# Patient Record
Sex: Female | Born: 1935
Health system: Southern US, Community
[De-identification: ages and names within clinical notes are randomized; demographics above are authoritative.]

## PROBLEM LIST (undated history)

## (undated) DIAGNOSIS — I771 Stricture of artery: Secondary | ICD-10-CM

## (undated) DIAGNOSIS — I6523 Occlusion and stenosis of bilateral carotid arteries: Secondary | ICD-10-CM

## (undated) DIAGNOSIS — M810 Age-related osteoporosis without current pathological fracture: Secondary | ICD-10-CM

## (undated) DIAGNOSIS — I63239 Cerebral infarction due to unspecified occlusion or stenosis of unspecified carotid arteries: Secondary | ICD-10-CM

## (undated) DIAGNOSIS — E039 Hypothyroidism, unspecified: Secondary | ICD-10-CM

## (undated) DIAGNOSIS — I471 Supraventricular tachycardia, unspecified: Secondary | ICD-10-CM

## (undated) DIAGNOSIS — R609 Edema, unspecified: Secondary | ICD-10-CM

## (undated) DIAGNOSIS — I739 Peripheral vascular disease, unspecified: Secondary | ICD-10-CM

## (undated) DIAGNOSIS — I4891 Unspecified atrial fibrillation: Secondary | ICD-10-CM

## (undated) DIAGNOSIS — F32A Depression, unspecified: Secondary | ICD-10-CM

## (undated) DIAGNOSIS — Z9289 Personal history of other medical treatment: Secondary | ICD-10-CM

## (undated) DIAGNOSIS — I10 Essential (primary) hypertension: Secondary | ICD-10-CM

## (undated) DIAGNOSIS — F329 Major depressive disorder, single episode, unspecified: Secondary | ICD-10-CM

## (undated) DIAGNOSIS — E785 Hyperlipidemia, unspecified: Secondary | ICD-10-CM

## (undated) DIAGNOSIS — J42 Unspecified chronic bronchitis: Secondary | ICD-10-CM

## (undated) DIAGNOSIS — C4490 Unspecified malignant neoplasm of skin, unspecified: Secondary | ICD-10-CM

## (undated) DIAGNOSIS — K635 Polyp of colon: Secondary | ICD-10-CM

## (undated) HISTORY — DX: Cerebral infarction due to unspecified occlusion or stenosis of unspecified carotid artery: I63.239

## (undated) HISTORY — DX: Supraventricular tachycardia, unspecified: I47.10

## (undated) HISTORY — DX: Major depressive disorder, single episode, unspecified: F32.9

## (undated) HISTORY — PX: CATARACT EXTRACTION W/ INTRAOCULAR LENS  IMPLANT, BILATERAL: SHX1307

## (undated) HISTORY — PX: ABDOMINAL HYSTERECTOMY: SHX81

## (undated) HISTORY — PX: COLONOSCOPY W/ BIOPSIES AND POLYPECTOMY: SHX1376

## (undated) HISTORY — DX: Occlusion and stenosis of bilateral carotid arteries: I65.23

## (undated) HISTORY — PX: FRACTURE SURGERY: SHX138

## (undated) HISTORY — DX: Essential (primary) hypertension: I10

## (undated) HISTORY — DX: Unspecified atrial fibrillation: I48.91

## (undated) HISTORY — DX: Depression, unspecified: F32.A

## (undated) HISTORY — DX: Age-related osteoporosis without current pathological fracture: M81.0

## (undated) HISTORY — DX: Edema, unspecified: R60.9

## (undated) HISTORY — PX: APPENDECTOMY: SHX54

## (undated) HISTORY — DX: Peripheral vascular disease, unspecified: I73.9

## (undated) HISTORY — DX: Stricture of artery: I77.1

## (undated) HISTORY — DX: Polyp of colon: K63.5

## (undated) HISTORY — DX: Supraventricular tachycardia: I47.1

## (undated) HISTORY — DX: Hyperlipidemia, unspecified: E78.5

---

## 2006-09-02 HISTORY — PX: HIP FRACTURE SURGERY: SHX118

## 2014-08-05 DIAGNOSIS — I471 Supraventricular tachycardia: Secondary | ICD-10-CM | POA: Diagnosis not present

## 2014-08-05 DIAGNOSIS — I10 Essential (primary) hypertension: Secondary | ICD-10-CM | POA: Diagnosis not present

## 2014-08-05 DIAGNOSIS — Z1389 Encounter for screening for other disorder: Secondary | ICD-10-CM | POA: Diagnosis not present

## 2014-08-05 DIAGNOSIS — Z23 Encounter for immunization: Secondary | ICD-10-CM | POA: Diagnosis not present

## 2014-08-05 DIAGNOSIS — E785 Hyperlipidemia, unspecified: Secondary | ICD-10-CM | POA: Diagnosis not present

## 2014-08-05 DIAGNOSIS — Z9181 History of falling: Secondary | ICD-10-CM | POA: Diagnosis not present

## 2014-08-05 DIAGNOSIS — E119 Type 2 diabetes mellitus without complications: Secondary | ICD-10-CM | POA: Diagnosis not present

## 2014-08-14 DIAGNOSIS — J019 Acute sinusitis, unspecified: Secondary | ICD-10-CM | POA: Diagnosis not present

## 2014-08-14 DIAGNOSIS — J209 Acute bronchitis, unspecified: Secondary | ICD-10-CM | POA: Diagnosis not present

## 2014-08-14 DIAGNOSIS — Z6827 Body mass index (BMI) 27.0-27.9, adult: Secondary | ICD-10-CM | POA: Diagnosis not present

## 2014-09-06 DIAGNOSIS — S72043D Displaced fracture of base of neck of unspecified femur, subsequent encounter for closed fracture with routine healing: Secondary | ICD-10-CM | POA: Diagnosis not present

## 2014-10-01 DIAGNOSIS — H2511 Age-related nuclear cataract, right eye: Secondary | ICD-10-CM | POA: Diagnosis not present

## 2014-10-22 DIAGNOSIS — Z79899 Other long term (current) drug therapy: Secondary | ICD-10-CM | POA: Diagnosis not present

## 2014-10-22 DIAGNOSIS — H2511 Age-related nuclear cataract, right eye: Secondary | ICD-10-CM | POA: Diagnosis not present

## 2014-10-22 DIAGNOSIS — I10 Essential (primary) hypertension: Secondary | ICD-10-CM | POA: Diagnosis not present

## 2014-10-22 DIAGNOSIS — H259 Unspecified age-related cataract: Secondary | ICD-10-CM | POA: Diagnosis not present

## 2014-11-26 DIAGNOSIS — I10 Essential (primary) hypertension: Secondary | ICD-10-CM | POA: Diagnosis not present

## 2014-11-26 DIAGNOSIS — Z79899 Other long term (current) drug therapy: Secondary | ICD-10-CM | POA: Diagnosis not present

## 2014-11-26 DIAGNOSIS — H2512 Age-related nuclear cataract, left eye: Secondary | ICD-10-CM | POA: Diagnosis not present

## 2014-11-26 DIAGNOSIS — H259 Unspecified age-related cataract: Secondary | ICD-10-CM | POA: Diagnosis not present

## 2014-11-26 DIAGNOSIS — Z87891 Personal history of nicotine dependence: Secondary | ICD-10-CM | POA: Diagnosis not present

## 2015-01-09 DIAGNOSIS — L570 Actinic keratosis: Secondary | ICD-10-CM | POA: Diagnosis not present

## 2015-01-09 DIAGNOSIS — C44729 Squamous cell carcinoma of skin of left lower limb, including hip: Secondary | ICD-10-CM | POA: Diagnosis not present

## 2015-01-09 DIAGNOSIS — L578 Other skin changes due to chronic exposure to nonionizing radiation: Secondary | ICD-10-CM | POA: Diagnosis not present

## 2015-01-09 DIAGNOSIS — L821 Other seborrheic keratosis: Secondary | ICD-10-CM | POA: Diagnosis not present

## 2015-02-25 DIAGNOSIS — Z23 Encounter for immunization: Secondary | ICD-10-CM | POA: Diagnosis not present

## 2015-02-25 DIAGNOSIS — E785 Hyperlipidemia, unspecified: Secondary | ICD-10-CM | POA: Diagnosis not present

## 2015-02-25 DIAGNOSIS — I10 Essential (primary) hypertension: Secondary | ICD-10-CM | POA: Diagnosis not present

## 2015-02-25 DIAGNOSIS — E119 Type 2 diabetes mellitus without complications: Secondary | ICD-10-CM | POA: Diagnosis not present

## 2015-02-25 DIAGNOSIS — I471 Supraventricular tachycardia: Secondary | ICD-10-CM | POA: Diagnosis not present

## 2015-02-27 DIAGNOSIS — Z1231 Encounter for screening mammogram for malignant neoplasm of breast: Secondary | ICD-10-CM | POA: Diagnosis not present

## 2015-02-27 DIAGNOSIS — E119 Type 2 diabetes mellitus without complications: Secondary | ICD-10-CM | POA: Diagnosis not present

## 2015-02-27 DIAGNOSIS — E785 Hyperlipidemia, unspecified: Secondary | ICD-10-CM | POA: Diagnosis not present

## 2015-04-14 DIAGNOSIS — L570 Actinic keratosis: Secondary | ICD-10-CM | POA: Diagnosis not present

## 2015-04-14 DIAGNOSIS — C44729 Squamous cell carcinoma of skin of left lower limb, including hip: Secondary | ICD-10-CM | POA: Diagnosis not present

## 2015-07-08 DIAGNOSIS — H26493 Other secondary cataract, bilateral: Secondary | ICD-10-CM | POA: Diagnosis not present

## 2015-07-18 DIAGNOSIS — N390 Urinary tract infection, site not specified: Secondary | ICD-10-CM | POA: Diagnosis not present

## 2015-08-01 DIAGNOSIS — Z6827 Body mass index (BMI) 27.0-27.9, adult: Secondary | ICD-10-CM | POA: Diagnosis not present

## 2015-08-01 DIAGNOSIS — J208 Acute bronchitis due to other specified organisms: Secondary | ICD-10-CM | POA: Diagnosis not present

## 2015-08-28 DIAGNOSIS — I471 Supraventricular tachycardia: Secondary | ICD-10-CM | POA: Diagnosis not present

## 2015-08-28 DIAGNOSIS — Z9181 History of falling: Secondary | ICD-10-CM | POA: Diagnosis not present

## 2015-08-28 DIAGNOSIS — I10 Essential (primary) hypertension: Secondary | ICD-10-CM | POA: Diagnosis not present

## 2015-08-28 DIAGNOSIS — E785 Hyperlipidemia, unspecified: Secondary | ICD-10-CM | POA: Diagnosis not present

## 2015-08-28 DIAGNOSIS — Z1389 Encounter for screening for other disorder: Secondary | ICD-10-CM | POA: Diagnosis not present

## 2015-08-28 DIAGNOSIS — E119 Type 2 diabetes mellitus without complications: Secondary | ICD-10-CM | POA: Diagnosis not present

## 2015-08-28 DIAGNOSIS — Z23 Encounter for immunization: Secondary | ICD-10-CM | POA: Diagnosis not present

## 2015-09-19 DIAGNOSIS — E785 Hyperlipidemia, unspecified: Secondary | ICD-10-CM | POA: Diagnosis not present

## 2015-09-19 DIAGNOSIS — I739 Peripheral vascular disease, unspecified: Secondary | ICD-10-CM | POA: Diagnosis not present

## 2015-09-19 DIAGNOSIS — I6523 Occlusion and stenosis of bilateral carotid arteries: Secondary | ICD-10-CM | POA: Diagnosis not present

## 2015-10-13 DIAGNOSIS — C44729 Squamous cell carcinoma of skin of left lower limb, including hip: Secondary | ICD-10-CM | POA: Diagnosis not present

## 2015-10-13 DIAGNOSIS — L570 Actinic keratosis: Secondary | ICD-10-CM | POA: Diagnosis not present

## 2015-10-13 DIAGNOSIS — L821 Other seborrheic keratosis: Secondary | ICD-10-CM | POA: Diagnosis not present

## 2015-10-29 DIAGNOSIS — I6523 Occlusion and stenosis of bilateral carotid arteries: Secondary | ICD-10-CM | POA: Diagnosis not present

## 2015-10-29 DIAGNOSIS — I739 Peripheral vascular disease, unspecified: Secondary | ICD-10-CM | POA: Diagnosis not present

## 2016-02-27 DIAGNOSIS — I471 Supraventricular tachycardia: Secondary | ICD-10-CM | POA: Diagnosis not present

## 2016-02-27 DIAGNOSIS — I10 Essential (primary) hypertension: Secondary | ICD-10-CM | POA: Diagnosis not present

## 2016-02-27 DIAGNOSIS — E119 Type 2 diabetes mellitus without complications: Secondary | ICD-10-CM | POA: Diagnosis not present

## 2016-02-27 DIAGNOSIS — K219 Gastro-esophageal reflux disease without esophagitis: Secondary | ICD-10-CM | POA: Diagnosis not present

## 2016-02-27 DIAGNOSIS — E785 Hyperlipidemia, unspecified: Secondary | ICD-10-CM | POA: Diagnosis not present

## 2016-03-05 DIAGNOSIS — M8588 Other specified disorders of bone density and structure, other site: Secondary | ICD-10-CM | POA: Diagnosis not present

## 2016-03-05 DIAGNOSIS — Z1231 Encounter for screening mammogram for malignant neoplasm of breast: Secondary | ICD-10-CM | POA: Diagnosis not present

## 2016-03-05 DIAGNOSIS — M8589 Other specified disorders of bone density and structure, multiple sites: Secondary | ICD-10-CM | POA: Diagnosis not present

## 2016-03-10 DIAGNOSIS — B373 Candidiasis of vulva and vagina: Secondary | ICD-10-CM | POA: Diagnosis not present

## 2016-03-10 DIAGNOSIS — Z6827 Body mass index (BMI) 27.0-27.9, adult: Secondary | ICD-10-CM | POA: Diagnosis not present

## 2016-04-14 DIAGNOSIS — L570 Actinic keratosis: Secondary | ICD-10-CM | POA: Diagnosis not present

## 2016-04-14 DIAGNOSIS — L820 Inflamed seborrheic keratosis: Secondary | ICD-10-CM | POA: Diagnosis not present

## 2016-04-14 DIAGNOSIS — L821 Other seborrheic keratosis: Secondary | ICD-10-CM | POA: Diagnosis not present

## 2016-04-14 DIAGNOSIS — L578 Other skin changes due to chronic exposure to nonionizing radiation: Secondary | ICD-10-CM | POA: Diagnosis not present

## 2016-04-19 DIAGNOSIS — N390 Urinary tract infection, site not specified: Secondary | ICD-10-CM | POA: Diagnosis not present

## 2016-06-01 DIAGNOSIS — Z23 Encounter for immunization: Secondary | ICD-10-CM | POA: Diagnosis not present

## 2016-07-06 DIAGNOSIS — J208 Acute bronchitis due to other specified organisms: Secondary | ICD-10-CM | POA: Diagnosis not present

## 2016-07-12 DIAGNOSIS — H26493 Other secondary cataract, bilateral: Secondary | ICD-10-CM | POA: Diagnosis not present

## 2016-08-30 DIAGNOSIS — I700 Atherosclerosis of aorta: Secondary | ICD-10-CM | POA: Diagnosis not present

## 2016-08-30 DIAGNOSIS — Z1389 Encounter for screening for other disorder: Secondary | ICD-10-CM | POA: Diagnosis not present

## 2016-08-30 DIAGNOSIS — Z9181 History of falling: Secondary | ICD-10-CM | POA: Diagnosis not present

## 2016-08-30 DIAGNOSIS — R0609 Other forms of dyspnea: Secondary | ICD-10-CM | POA: Diagnosis not present

## 2016-08-30 DIAGNOSIS — E119 Type 2 diabetes mellitus without complications: Secondary | ICD-10-CM | POA: Diagnosis not present

## 2016-08-30 DIAGNOSIS — I10 Essential (primary) hypertension: Secondary | ICD-10-CM | POA: Diagnosis not present

## 2016-08-30 DIAGNOSIS — I471 Supraventricular tachycardia: Secondary | ICD-10-CM | POA: Diagnosis not present

## 2016-08-30 DIAGNOSIS — R0602 Shortness of breath: Secondary | ICD-10-CM | POA: Diagnosis not present

## 2016-08-30 DIAGNOSIS — E785 Hyperlipidemia, unspecified: Secondary | ICD-10-CM | POA: Diagnosis not present

## 2016-10-13 DIAGNOSIS — D1801 Hemangioma of skin and subcutaneous tissue: Secondary | ICD-10-CM | POA: Diagnosis not present

## 2016-10-13 DIAGNOSIS — L578 Other skin changes due to chronic exposure to nonionizing radiation: Secondary | ICD-10-CM | POA: Diagnosis not present

## 2016-10-13 DIAGNOSIS — L821 Other seborrheic keratosis: Secondary | ICD-10-CM | POA: Diagnosis not present

## 2016-10-13 DIAGNOSIS — L570 Actinic keratosis: Secondary | ICD-10-CM | POA: Diagnosis not present

## 2016-11-10 DIAGNOSIS — S29019A Strain of muscle and tendon of unspecified wall of thorax, initial encounter: Secondary | ICD-10-CM | POA: Diagnosis not present

## 2016-12-23 DIAGNOSIS — R399 Unspecified symptoms and signs involving the genitourinary system: Secondary | ICD-10-CM | POA: Diagnosis not present

## 2016-12-23 DIAGNOSIS — M546 Pain in thoracic spine: Secondary | ICD-10-CM | POA: Diagnosis not present

## 2016-12-23 DIAGNOSIS — M545 Low back pain: Secondary | ICD-10-CM | POA: Diagnosis not present

## 2016-12-23 DIAGNOSIS — M4854XA Collapsed vertebra, not elsewhere classified, thoracic region, initial encounter for fracture: Secondary | ICD-10-CM | POA: Diagnosis not present

## 2016-12-24 DIAGNOSIS — S22080A Wedge compression fracture of T11-T12 vertebra, initial encounter for closed fracture: Secondary | ICD-10-CM | POA: Diagnosis not present

## 2016-12-31 DIAGNOSIS — S22080A Wedge compression fracture of T11-T12 vertebra, initial encounter for closed fracture: Secondary | ICD-10-CM | POA: Diagnosis not present

## 2016-12-31 DIAGNOSIS — S22070A Wedge compression fracture of T9-T10 vertebra, initial encounter for closed fracture: Secondary | ICD-10-CM | POA: Diagnosis not present

## 2017-01-04 DIAGNOSIS — S22080A Wedge compression fracture of T11-T12 vertebra, initial encounter for closed fracture: Secondary | ICD-10-CM | POA: Diagnosis not present

## 2017-01-17 DIAGNOSIS — E7800 Pure hypercholesterolemia, unspecified: Secondary | ICD-10-CM | POA: Diagnosis not present

## 2017-01-17 DIAGNOSIS — J45909 Unspecified asthma, uncomplicated: Secondary | ICD-10-CM | POA: Diagnosis not present

## 2017-01-17 DIAGNOSIS — S22070A Wedge compression fracture of T9-T10 vertebra, initial encounter for closed fracture: Secondary | ICD-10-CM | POA: Diagnosis not present

## 2017-01-17 DIAGNOSIS — Z87891 Personal history of nicotine dependence: Secondary | ICD-10-CM | POA: Diagnosis not present

## 2017-01-17 DIAGNOSIS — S22060A Wedge compression fracture of T7-T8 vertebra, initial encounter for closed fracture: Secondary | ICD-10-CM | POA: Diagnosis not present

## 2017-01-17 DIAGNOSIS — K219 Gastro-esophageal reflux disease without esophagitis: Secondary | ICD-10-CM | POA: Diagnosis not present

## 2017-01-17 DIAGNOSIS — I10 Essential (primary) hypertension: Secondary | ICD-10-CM | POA: Diagnosis not present

## 2017-01-17 DIAGNOSIS — Z79899 Other long term (current) drug therapy: Secondary | ICD-10-CM | POA: Diagnosis not present

## 2017-03-02 DIAGNOSIS — I471 Supraventricular tachycardia: Secondary | ICD-10-CM | POA: Diagnosis not present

## 2017-03-02 DIAGNOSIS — I10 Essential (primary) hypertension: Secondary | ICD-10-CM | POA: Diagnosis not present

## 2017-03-02 DIAGNOSIS — E119 Type 2 diabetes mellitus without complications: Secondary | ICD-10-CM | POA: Diagnosis not present

## 2017-03-02 DIAGNOSIS — E785 Hyperlipidemia, unspecified: Secondary | ICD-10-CM | POA: Diagnosis not present

## 2017-03-02 DIAGNOSIS — Z139 Encounter for screening, unspecified: Secondary | ICD-10-CM | POA: Diagnosis not present

## 2017-03-07 DIAGNOSIS — J309 Allergic rhinitis, unspecified: Secondary | ICD-10-CM | POA: Diagnosis not present

## 2017-03-10 DIAGNOSIS — Z1231 Encounter for screening mammogram for malignant neoplasm of breast: Secondary | ICD-10-CM | POA: Diagnosis not present

## 2017-03-15 DIAGNOSIS — R300 Dysuria: Secondary | ICD-10-CM | POA: Diagnosis not present

## 2017-03-15 DIAGNOSIS — B379 Candidiasis, unspecified: Secondary | ICD-10-CM | POA: Diagnosis not present

## 2017-03-31 DIAGNOSIS — L300 Nummular dermatitis: Secondary | ICD-10-CM | POA: Diagnosis not present

## 2017-03-31 DIAGNOSIS — L299 Pruritus, unspecified: Secondary | ICD-10-CM | POA: Diagnosis not present

## 2017-06-13 DIAGNOSIS — J320 Chronic maxillary sinusitis: Secondary | ICD-10-CM | POA: Diagnosis not present

## 2017-06-13 DIAGNOSIS — R300 Dysuria: Secondary | ICD-10-CM | POA: Diagnosis not present

## 2017-06-13 DIAGNOSIS — H6692 Otitis media, unspecified, left ear: Secondary | ICD-10-CM | POA: Diagnosis not present

## 2017-06-14 DIAGNOSIS — R300 Dysuria: Secondary | ICD-10-CM | POA: Diagnosis not present

## 2017-07-01 DIAGNOSIS — J208 Acute bronchitis due to other specified organisms: Secondary | ICD-10-CM | POA: Diagnosis not present

## 2017-07-28 DIAGNOSIS — H26493 Other secondary cataract, bilateral: Secondary | ICD-10-CM | POA: Diagnosis not present

## 2017-08-15 DIAGNOSIS — Z23 Encounter for immunization: Secondary | ICD-10-CM | POA: Diagnosis not present

## 2017-08-20 DIAGNOSIS — J209 Acute bronchitis, unspecified: Secondary | ICD-10-CM | POA: Diagnosis not present

## 2017-08-20 DIAGNOSIS — J0100 Acute maxillary sinusitis, unspecified: Secondary | ICD-10-CM | POA: Diagnosis not present

## 2017-09-04 DIAGNOSIS — R112 Nausea with vomiting, unspecified: Secondary | ICD-10-CM | POA: Diagnosis not present

## 2017-09-04 DIAGNOSIS — K219 Gastro-esophageal reflux disease without esophagitis: Secondary | ICD-10-CM | POA: Diagnosis not present

## 2017-09-04 DIAGNOSIS — R609 Edema, unspecified: Secondary | ICD-10-CM | POA: Diagnosis not present

## 2017-09-04 DIAGNOSIS — Z79899 Other long term (current) drug therapy: Secondary | ICD-10-CM | POA: Diagnosis not present

## 2017-09-04 DIAGNOSIS — R110 Nausea: Secondary | ICD-10-CM | POA: Diagnosis not present

## 2017-09-04 DIAGNOSIS — R251 Tremor, unspecified: Secondary | ICD-10-CM | POA: Diagnosis not present

## 2017-09-04 DIAGNOSIS — I447 Left bundle-branch block, unspecified: Secondary | ICD-10-CM | POA: Diagnosis not present

## 2017-09-04 DIAGNOSIS — R29700 NIHSS score 0: Secondary | ICD-10-CM | POA: Diagnosis not present

## 2017-09-04 DIAGNOSIS — R069 Unspecified abnormalities of breathing: Secondary | ICD-10-CM | POA: Diagnosis not present

## 2017-09-04 DIAGNOSIS — I4891 Unspecified atrial fibrillation: Secondary | ICD-10-CM | POA: Diagnosis not present

## 2017-09-04 DIAGNOSIS — Z87891 Personal history of nicotine dependence: Secondary | ICD-10-CM | POA: Diagnosis not present

## 2017-09-04 DIAGNOSIS — M810 Age-related osteoporosis without current pathological fracture: Secondary | ICD-10-CM | POA: Diagnosis not present

## 2017-09-04 DIAGNOSIS — R9431 Abnormal electrocardiogram [ECG] [EKG]: Secondary | ICD-10-CM | POA: Diagnosis not present

## 2017-09-04 DIAGNOSIS — I10 Essential (primary) hypertension: Secondary | ICD-10-CM | POA: Diagnosis not present

## 2017-09-04 DIAGNOSIS — R0600 Dyspnea, unspecified: Secondary | ICD-10-CM | POA: Diagnosis not present

## 2017-09-04 DIAGNOSIS — Z7982 Long term (current) use of aspirin: Secondary | ICD-10-CM | POA: Diagnosis not present

## 2017-09-05 DIAGNOSIS — R079 Chest pain, unspecified: Secondary | ICD-10-CM | POA: Diagnosis not present

## 2017-09-05 DIAGNOSIS — M810 Age-related osteoporosis without current pathological fracture: Secondary | ICD-10-CM | POA: Diagnosis not present

## 2017-09-05 DIAGNOSIS — I4891 Unspecified atrial fibrillation: Secondary | ICD-10-CM | POA: Diagnosis not present

## 2017-09-05 DIAGNOSIS — I10 Essential (primary) hypertension: Secondary | ICD-10-CM | POA: Diagnosis not present

## 2017-09-06 ENCOUNTER — Encounter: Payer: Self-pay | Admitting: *Deleted

## 2017-09-06 DIAGNOSIS — I771 Stricture of artery: Secondary | ICD-10-CM

## 2017-09-06 DIAGNOSIS — F329 Major depressive disorder, single episode, unspecified: Secondary | ICD-10-CM | POA: Insufficient documentation

## 2017-09-06 DIAGNOSIS — E119 Type 2 diabetes mellitus without complications: Secondary | ICD-10-CM | POA: Diagnosis not present

## 2017-09-06 DIAGNOSIS — K635 Polyp of colon: Secondary | ICD-10-CM | POA: Insufficient documentation

## 2017-09-06 DIAGNOSIS — R609 Edema, unspecified: Secondary | ICD-10-CM | POA: Insufficient documentation

## 2017-09-06 DIAGNOSIS — I471 Supraventricular tachycardia, unspecified: Secondary | ICD-10-CM | POA: Insufficient documentation

## 2017-09-06 DIAGNOSIS — I4891 Unspecified atrial fibrillation: Secondary | ICD-10-CM | POA: Diagnosis not present

## 2017-09-06 DIAGNOSIS — E785 Hyperlipidemia, unspecified: Secondary | ICD-10-CM | POA: Insufficient documentation

## 2017-09-06 DIAGNOSIS — I10 Essential (primary) hypertension: Secondary | ICD-10-CM | POA: Diagnosis not present

## 2017-09-06 DIAGNOSIS — I739 Peripheral vascular disease, unspecified: Secondary | ICD-10-CM | POA: Insufficient documentation

## 2017-09-06 DIAGNOSIS — F32A Depression, unspecified: Secondary | ICD-10-CM | POA: Insufficient documentation

## 2017-09-06 DIAGNOSIS — I6523 Occlusion and stenosis of bilateral carotid arteries: Secondary | ICD-10-CM

## 2017-09-06 DIAGNOSIS — S72001A Fracture of unspecified part of neck of right femur, initial encounter for closed fracture: Secondary | ICD-10-CM | POA: Insufficient documentation

## 2017-09-06 DIAGNOSIS — Z79899 Other long term (current) drug therapy: Secondary | ICD-10-CM | POA: Diagnosis not present

## 2017-09-06 DIAGNOSIS — I63239 Cerebral infarction due to unspecified occlusion or stenosis of unspecified carotid arteries: Secondary | ICD-10-CM

## 2017-09-16 DIAGNOSIS — N390 Urinary tract infection, site not specified: Secondary | ICD-10-CM | POA: Diagnosis not present

## 2017-09-19 ENCOUNTER — Ambulatory Visit: Payer: Medicare Other | Admitting: Cardiology

## 2017-09-19 ENCOUNTER — Encounter: Payer: Self-pay | Admitting: Cardiology

## 2017-09-19 VITALS — BP 190/120 | HR 101 | Ht 65.0 in | Wt 153.0 lb

## 2017-09-19 DIAGNOSIS — I6523 Occlusion and stenosis of bilateral carotid arteries: Secondary | ICD-10-CM

## 2017-09-19 DIAGNOSIS — I10 Essential (primary) hypertension: Secondary | ICD-10-CM | POA: Diagnosis not present

## 2017-09-19 DIAGNOSIS — I480 Paroxysmal atrial fibrillation: Secondary | ICD-10-CM | POA: Diagnosis not present

## 2017-09-19 DIAGNOSIS — I4819 Other persistent atrial fibrillation: Secondary | ICD-10-CM | POA: Insufficient documentation

## 2017-09-19 DIAGNOSIS — I771 Stricture of artery: Secondary | ICD-10-CM | POA: Diagnosis not present

## 2017-09-19 DIAGNOSIS — E785 Hyperlipidemia, unspecified: Secondary | ICD-10-CM

## 2017-09-19 MED ORDER — METOPROLOL TARTRATE 25 MG PO TABS: 25.0000 mg | ORAL_TABLET | Freq: Two times a day (BID) | ORAL | 3 refills | Status: DC

## 2017-09-19 NOTE — Patient Instructions (Signed)
Medication Instructions:  Your physician has recommended you make the following change in your medication:  Increase your Metoprolol 25 mg 1 tablet twice daily  Labwork: Your physician recommends that you have lab work today: BMP  Testing/Procedures: Your physician has requested that you have a carotid duplex. This test is an ultrasound of the carotid arteries in your neck. It looks at blood flow through these arteries that supply the brain with blood. Allow one hour for this exam. There are no restrictions or special instructions.  Follow-Up: Your physician recommends that you schedule a follow-up appointment in: 2 weeks with DrMarland Kitchen Agustin Cree   Any Other Special Instructions Will Be Listed Below (If Applicable).     If you need a refill on your cardiac medications before your next appointment, please call your pharmacy.

## 2017-09-19 NOTE — Progress Notes (Signed)
**Note Brooke-Identified via Obfuscation** Cardiology Consultation:    Date:  09/19/2017   ID:  Brooke Mullins, DOB [birth year], MRN [id number]  PCP:  [name], MD  Cardiologist:  [name], MD   Referring MD: No ref. provider found   Chief Complaint  Patient presents with  . Hospitalization Follow-up  I had atrial fibrillation  History of Present Illness:    Brooke Mullins is a 84 y.o. female who is being seen today for the evaluation of atrial fibrillation at the request of No ref. provider found.  2 weeks ago she was hospitalized in [facility].  The reason for hospitalization was the fact that she started shaking and she was find to be in atrial fibrillation.  Quite extensive evaluation was done including CT of her head which did not show any acute changes: She had a stress test which was negative.  She also had echocardiogram which showed ejection fraction of 5055%.  She was diagnosed with new onset of atrial fibrillation.  She was anticoagulated and discharged home.  Today took she comes to our office to be established as a patient.  She feels horrible.  She said her heart speeds up very easily when she does even minimal effort.  Described to have some exertional shortness of breath but no chest pain tightness squeezing pressure burning chest.  She is aware of her heart speeding up.  Past Medical History:  Diagnosis Date  . Atrial fibrillation ([place])   . Carotid artery stenosis with cerebral infarction ([place])   . Carotid atherosclerosis, bilateral   . Depression   . Diabetes mellitus ([place])   . Dyslipidemia   . Edema   . Fracture of femoral neck, right ([place])   . Hyperplastic colon polyp   . Hypertension   . Osteoporosis   . Peripheral vascular disease of extremity ([place])   . PSVT (paroxysmal supraventricular tachycardia) ([place])   . Subclavian artery stenosis [facility])     Past Surgical History:  Procedure Laterality Date  . ABDOMINAL HYSTERECTOMY      Current Medications: Current Meds  Medication  Sig  . albuterol (PROAIR HFA) 108 (90 Base) MCG/ACT inhaler Inhale into the lungs.  . ciprofloxacin (CIPRO) 250 MG tablet Take 250 mg by mouth 2 (two) times daily with a meal.  . citalopram (CELEXA) 20 MG tablet Take 20 mg by mouth.  Arne Cleveland 5 MG TABS tablet Take 5 mg by mouth 2 (two) times daily.  . furosemide (LASIX) 20 MG tablet Take 20 mg by mouth.  . metoprolol tartrate (LOPRESSOR) 25 MG tablet Take 12.5 mg by mouth 2 (two) times daily.  Marland Kitchen omeprazole (PRILOSEC) 20 MG capsule Take 20 mg by mouth daily.  . quinapril (ACCUPRIL) 40 MG tablet TAKE ONE TABLET EVERY DAY AT BEDTIME     Allergies:   Penicillins; Atorvastatin; Azithromycin; Codeine; Colesevelam; and Ezetimibe   Social History   Socioeconomic History  . Marital status: Unknown    Spouse name: None  . Number of children: None  . Years of education: None  . Highest education level: None  Social Needs  . Financial resource strain: None  . Food insecurity - worry: None  . Food insecurity - inability: None  . Transportation needs - medical: None  . Transportation needs - non-medical: None  Occupational History  . None  Tobacco Use  . Smoking status: Former Research scientist (life sciences)  . Smokeless tobacco: Never Used  Substance and Sexual Activity  . Alcohol use: No    Frequency: Never  .  Drug use: No  . Sexual activity: None  Other Topics Concern  . None  Social History Narrative  . None     Family History: The patient's family history includes COPD in her father; Cancer in her brother; Heart failure in her mother; Hypertension in her brother. ROS:   Please see the history of present illness.    All 14 point review of systems negative except as described per history of present illness.  EKGs/Labs/Other Studies Reviewed:    The following studies were reviewed today: All study from hospital reviewed including CT of her head: Echocardiogram: Stress test.  EKG:  EKG is  ordered today.  The ekg ordered today demonstrates atrial  fibrillation controlled ventricular rate: Left bundle branch block  Recent Labs: No results found for requested labs within last 8760 hours.  Recent Lipid Panel No results found for: CHOL, TRIG, HDL, CHOLHDL, VLDL, LDLCALC, LDLDIRECT  Physical Exam:    VS:  BP (!) 190/120 (BP Location: Right Arm, Patient Position: Sitting, Cuff Size: Normal)   Pulse (!) 101   Ht 5\' 5"  (1.651 m)   Wt 153 lb (69.4 kg)   SpO2 98%   BMI 25.46 kg/m     Wt Readings from Last 3 Encounters:  09/19/17 153 lb (69.4 kg)     GEN:  Well nourished, well developed in no acute distress HEENT: Normal NECK: No JVD; No carotid bruits LYMPHATICS: No lymphadenopathy CARDIAC: Irregularly irregular, no murmurs, no rubs, no gallops RESPIRATORY:  Clear to auscultation without rales, wheezing or rhonchi  ABDOMEN: Soft, non-tender, non-distended MUSCULOSKELETAL: 1+; No deformity  SKIN: Warm and dry NEUROLOGIC:  Alert and oriented x 3 PSYCHIATRIC:  Normal affect   ASSESSMENT:    1. Essential hypertension   2. Carotid atherosclerosis, bilateral   3. Subclavian artery stenosis (HCC)   4. Dyslipidemia    PLAN:    In order of problems listed above:  1. Persistent atrial fibrillation: She is anticoagulated with Eliquis which I will continue.  I will increase dose of beta-blocker however first I would like to check her pulse oximetry and wake her walk around to see what his heart rate does with exercise.  We were talking about options in this situation option 1 waiting for another 2 weeks for full anticoagulation in 4 weeks of anticoagulation and convert her elected to to sinus rhythm or a transesophageal echocardiogram with conversion right away.  She favored to wait for another 2 weeks.  I hope by increasing dose of metoprolol to 25 twice daily make her feel better.   CHADS2Vas = 4 2. Essential hypertension: Blood pressure elevated today but he check it at home usually 130/70.  Hopefully will be better controlled with  slightly higher dose of beta-blocker. 3. Dyslipidemia: She is taking high intensity statin which I will continue. 4. History of peripheral vascular disease with subclavian artery stenosis: I will schedule her to have carotid ultrasound. 5. She does have some swelling of lower extremities I will ask her to have Chem-7 done today if Chem-7 is acceptable we will give her slightly higher dose of diuretic.   Medication Adjustments/Labs and Tests Ordered: Current medicines are reviewed at length with the patient today.  Concerns regarding medicines are outlined above.  No orders of the defined types were placed in this encounter.  No orders of the defined types were placed in this encounter.   Signed, Park Liter, MD, [facility]. 09/19/2017 3:51 PM    [facility]

## 2017-09-20 LAB — BASIC METABOLIC PANEL
BUN/Creatinine Ratio: 12 (ref 12–28)
BUN: 12 mg/dL (ref 8–27)
CALCIUM: 9.4 mg/dL (ref 8.7–10.3)
CO2: 23 mmol/L (ref 20–29)
Chloride: 98 mmol/L (ref 96–106)
Creatinine, Ser: 0.98 mg/dL (ref 0.57–1.00)
GFR calc Af Amer: 63 mL/min/{1.73_m2} (ref >59)
GFR, EST NON AFRICAN AMERICAN: 54 mL/min/{1.73_m2} — AB (ref >59)
GLUCOSE: 105 mg/dL — AB (ref 65–99)
Potassium: 4.4 mmol/L (ref 3.5–5.2)
Sodium: 139 mmol/L (ref 134–144)

## 2017-10-05 DIAGNOSIS — Z1231 Encounter for screening mammogram for malignant neoplasm of breast: Secondary | ICD-10-CM | POA: Diagnosis not present

## 2017-10-05 DIAGNOSIS — Z9181 History of falling: Secondary | ICD-10-CM | POA: Diagnosis not present

## 2017-10-05 DIAGNOSIS — N959 Unspecified menopausal and perimenopausal disorder: Secondary | ICD-10-CM | POA: Diagnosis not present

## 2017-10-05 DIAGNOSIS — Z0000 Encounter for general adult medical examination without abnormal findings: Secondary | ICD-10-CM | POA: Diagnosis not present

## 2017-10-05 DIAGNOSIS — Z1331 Encounter for screening for depression: Secondary | ICD-10-CM | POA: Diagnosis not present

## 2017-10-05 DIAGNOSIS — Z136 Encounter for screening for cardiovascular disorders: Secondary | ICD-10-CM | POA: Diagnosis not present

## 2017-10-05 DIAGNOSIS — E785 Hyperlipidemia, unspecified: Secondary | ICD-10-CM | POA: Diagnosis not present

## 2017-10-10 ENCOUNTER — Ambulatory Visit: Payer: Medicare Other | Admitting: Cardiology

## 2017-10-13 ENCOUNTER — Ambulatory Visit (HOSPITAL_BASED_OUTPATIENT_CLINIC_OR_DEPARTMENT_OTHER)
Admission: RE | Admit: 2017-10-13 | Discharge: 2017-10-13 | Disposition: A | Discharge status: Home / Self Care | Payer: Medicare Other | Source: Ambulatory Visit | Attending: Cardiology | Admitting: Cardiology

## 2017-10-13 ENCOUNTER — Encounter: Payer: Self-pay | Admitting: *Deleted

## 2017-10-13 ENCOUNTER — Encounter: Payer: Self-pay | Admitting: Cardiology

## 2017-10-13 ENCOUNTER — Ambulatory Visit (INDEPENDENT_AMBULATORY_CARE_PROVIDER_SITE_OTHER): Payer: Medicare Other | Admitting: Cardiology

## 2017-10-13 VITALS — BP 138/88 | HR 84 | Ht 65.0 in | Wt 155.0 lb

## 2017-10-13 DIAGNOSIS — I6523 Occlusion and stenosis of bilateral carotid arteries: Secondary | ICD-10-CM

## 2017-10-13 DIAGNOSIS — I63239 Cerebral infarction due to unspecified occlusion or stenosis of unspecified carotid arteries: Secondary | ICD-10-CM | POA: Diagnosis not present

## 2017-10-13 DIAGNOSIS — I480 Paroxysmal atrial fibrillation: Secondary | ICD-10-CM | POA: Diagnosis not present

## 2017-10-13 DIAGNOSIS — I471 Supraventricular tachycardia: Secondary | ICD-10-CM

## 2017-10-13 DIAGNOSIS — E785 Hyperlipidemia, unspecified: Secondary | ICD-10-CM

## 2017-10-13 DIAGNOSIS — I10 Essential (primary) hypertension: Secondary | ICD-10-CM | POA: Diagnosis not present

## 2017-10-13 DIAGNOSIS — I771 Stricture of artery: Secondary | ICD-10-CM

## 2017-10-13 NOTE — Progress Notes (Signed)
*  PRELIMINARY RESULTS*  Carotid duplex performed, Right side ICA is occluded, No flow was detected, Left side ICA is mild to moderately stenosed, Moderate atherosclerosis bilaterally.   Joelene Millin 10/13/2017, 3:38 PM

## 2017-10-13 NOTE — H&P (View-Only) (Signed)
Cardiology Office Note:    Date:  10/13/2017   ID:  KIAHNA BANGHART, DOB 1933-10-27, MRN [id number]  PCP:  [name], MD  Cardiologist:  [name], MD    Referring MD: [name], MD   Chief Complaint  Patient presents with  . Follow-up    to discuss fatigue and SHOB  Still feeling weak and tired  History of Present Illness:    Brooke Mullins is a 84 y.o. female with persistent atrial fibrillation.  She was scheduled to have carotid ultrasounds today I will requested to be seen because still feeling poorly.  Complaint of being weak tired exhausted also some shortness of breath feels her heart sleep flapping and palpitating.  She appears to be still in atrial fibrillation I told her that her symptoms could be related to her atrial fibrillation and will expedite his cardioversion.  She is being already taking anticoagulation for at least 5 weeks.  Plan will be to do EKG today if EKG show me still atrial fibrillation which I suspect will be the case we will schedule her for elective cardioversion.  She will have her carotid ultrasound today  Past Medical History:  Diagnosis Date  . Atrial fibrillation ([place])   . Carotid artery stenosis with cerebral infarction ([place])   . Carotid atherosclerosis, bilateral   . Depression   . Diabetes mellitus ([place])   . Dyslipidemia   . Edema   . Fracture of femoral neck, right ([place])   . Hyperplastic colon polyp   . Hypertension   . Osteoporosis   . Peripheral vascular disease of extremity ([place])   . PSVT (paroxysmal supraventricular tachycardia) ([place])   . Subclavian artery stenosis [facility])     Past Surgical History:  Procedure Laterality Date  . ABDOMINAL HYSTERECTOMY      Current Medications: Current Meds  Medication Sig  . albuterol (PROAIR HFA) 108 (90 Base) MCG/ACT inhaler Inhale into the lungs.  . citalopram (CELEXA) 20 MG tablet Take 20 mg by mouth.  Arne Cleveland 5 MG TABS tablet Take 5 mg by mouth 2 (two) times daily.    . furosemide (LASIX) 20 MG tablet Take 20 mg by mouth daily as needed.   . metoprolol tartrate (LOPRESSOR) 25 MG tablet Take 1 tablet (25 mg total) by mouth 2 (two) times daily.  Marland Kitchen omeprazole (PRILOSEC) 20 MG capsule Take 20 mg by mouth daily.  . quinapril (ACCUPRIL) 40 MG tablet TAKE ONE TABLET EVERY DAY AT BEDTIME     Allergies:   Penicillins; Atorvastatin; Azithromycin; Codeine; Colesevelam; and Ezetimibe   Social History   Socioeconomic History  . Marital status: Unknown    Spouse name: None  . Number of children: None  . Years of education: None  . Highest education level: None  Social Needs  . Financial resource strain: None  . Food insecurity - worry: None  . Food insecurity - inability: None  . Transportation needs - medical: None  . Transportation needs - non-medical: None  Occupational History  . None  Tobacco Use  . Smoking status: Former Research scientist (life sciences)  . Smokeless tobacco: Never Used  Substance and Sexual Activity  . Alcohol use: No    Frequency: Never  . Drug use: No  . Sexual activity: None  Other Topics Concern  . None  Social History Narrative  . None     Family History: The patient's family history includes COPD in her father; Cancer in her brother; Heart failure in her mother;  Hypertension in her brother. ROS:   Please see the history of present illness.    All 14 point review of systems negative except as described per history of present illness  EKGs/Labs/Other Studies Reviewed:      Recent Labs: 09/19/2017: BUN 12; Creatinine, Ser 0.98; Potassium 4.4; Sodium 139  Recent Lipid Panel No results found for: CHOL, TRIG, HDL, CHOLHDL, VLDL, LDLCALC, LDLDIRECT  Physical Exam:    VS:  BP 138/88 (BP Location: Right Arm, Patient Position: Sitting, Cuff Size: Normal)   Pulse 84   Ht 5\' 5"  (1.651 m)   Wt 155 lb (70.3 kg)   SpO2 97%   BMI 25.79 kg/m     Wt Readings from Last 3 Encounters:  10/13/17 155 lb (70.3 kg)  09/19/17 153 lb (69.4 kg)      GEN:  Well nourished, well developed in no acute distress HEENT: Normal NECK: No JVD; No carotid bruits LYMPHATICS: No lymphadenopathy CARDIAC: Irregularly irregular, no murmurs, no rubs, no gallops RESPIRATORY:  Clear to auscultation without rales, wheezing or rhonchi  ABDOMEN: Soft, non-tender, non-distended MUSCULOSKELETAL:  No edema; No deformity  SKIN: Warm and dry LOWER EXTREMITIES: no swelling NEUROLOGIC:  Alert and oriented x 3 PSYCHIATRIC:  Normal affect   ASSESSMENT:    1. Carotid artery stenosis with cerebral infarction ([place])   2. Essential hypertension   3. Subclavian artery stenosis (HCC)   4. Dyslipidemia    PLAN:    In order of problems listed above:  1. Persistent atrial fibrillation: EKG will be done to confirm it continue anticoagulation will do cardioversion. 2. Essential hypertension: Blood pressure well controlled continue present management. 3. Subclavian artery stenosis we will continue monitoring.  Carotid ultrasounds will be done today. 4. His lipidemia on statin.   Medication Adjustments/Labs and Tests Ordered: Current medicines are reviewed at length with the patient today.  Concerns regarding medicines are outlined above.  No orders of the defined types were placed in this encounter.  Medication changes: No orders of the defined types were placed in this encounter.   Signed, Park Liter, MD, [facility] 10/13/2017 2:17 PM    [facility]

## 2017-10-13 NOTE — Patient Instructions (Addendum)
Medication Instructions:  Your physician recommends that you continue on your current medications as directed. Please refer to the Current Medication list given to you today.   Labwork: None  Testing/Procedures: You had an EKG today.   You are scheduled for a cardioversion on October 17, 2016 at 9 am. Please arrive at 7:30 am.   Follow-Up: Your physician recommends that you schedule a follow-up appointment in: 1 month.  Any Other Special Instructions Will Be Listed Below (If Applicable).     If you need a refill on your cardiac medications before your next appointment, please call your pharmacy.

## 2017-10-13 NOTE — Addendum Note (Signed)
Addended by: [name] on: 10/13/2017 03:16 PM   Modules accepted: Orders

## 2017-10-13 NOTE — Addendum Note (Signed)
Addended by: Austin Miles on: 10/13/2017 02:51 PM   Modules accepted: Orders

## 2017-10-13 NOTE — Progress Notes (Signed)
Cardiology Office Note:    Date:  10/13/2017   ID:  Brooke Mullins, DOB Jun 13, 1934, MRN [id number]  PCP:  Brooke Dress, MD  Cardiologist:  Brooke Campus, MD    Referring MD: Brooke Dress, MD   Chief Complaint  Patient presents with  . Follow-up    to discuss fatigue and SHOB  Still feeling weak and tired  History of Present Illness:    Brooke Mullins is a 84 y.o. female with persistent atrial fibrillation.  She was scheduled to have carotid ultrasounds today I will requested to be seen because still feeling poorly.  Complaint of being weak tired exhausted also some shortness of breath feels her heart sleep flapping and palpitating.  She appears to be still in atrial fibrillation I told her that her symptoms could be related to her atrial fibrillation and will expedite his cardioversion.  She is being already taking anticoagulation for at least 5 weeks.  Plan will be to do EKG today if EKG show me still atrial fibrillation which I suspect will be the case we will schedule her for elective cardioversion.  She will have her carotid ultrasound today  Past Medical History:  Diagnosis Date  . Atrial fibrillation ([place])   . Carotid artery stenosis with cerebral infarction ([place])   . Carotid atherosclerosis, bilateral   . Depression   . Diabetes mellitus ([place])   . Dyslipidemia   . Edema   . Fracture of femoral neck, right ([place])   . Hyperplastic colon polyp   . Hypertension   . Osteoporosis   . Peripheral vascular disease of extremity ([place])   . PSVT (paroxysmal supraventricular tachycardia) ([place])   . Subclavian artery stenosis [facility])     Past Surgical History:  Procedure Laterality Date  . ABDOMINAL HYSTERECTOMY      Current Medications: Current Meds  Medication Sig  . albuterol (PROAIR HFA) 108 (90 Base) MCG/ACT inhaler Inhale into the lungs.  . citalopram (CELEXA) 20 MG tablet Take 20 mg by mouth.  Arne Cleveland 5 MG TABS tablet Take 5 mg by mouth 2 (two) times daily.    . furosemide (LASIX) 20 MG tablet Take 20 mg by mouth daily as needed.   . metoprolol tartrate (LOPRESSOR) 25 MG tablet Take 1 tablet (25 mg total) by mouth 2 (two) times daily.  Marland Kitchen omeprazole (PRILOSEC) 20 MG capsule Take 20 mg by mouth daily.  . quinapril (ACCUPRIL) 40 MG tablet TAKE ONE TABLET EVERY DAY AT BEDTIME     Allergies:   Penicillins; Atorvastatin; Azithromycin; Codeine; Colesevelam; and Ezetimibe   Social History   Socioeconomic History  . Marital status: Unknown    Spouse name: None  . Number of children: None  . Years of education: None  . Highest education level: None  Social Needs  . Financial resource strain: None  . Food insecurity - worry: None  . Food insecurity - inability: None  . Transportation needs - medical: None  . Transportation needs - non-medical: None  Occupational History  . None  Tobacco Use  . Smoking status: Former Research scientist (life sciences)  . Smokeless tobacco: Never Used  Substance and Sexual Activity  . Alcohol use: No    Frequency: Never  . Drug use: No  . Sexual activity: None  Other Topics Concern  . None  Social History Narrative  . None     Family History: The patient's family history includes COPD in her father; Cancer in her brother; Heart failure in her mother;  Hypertension in her brother. ROS:   Please see the history of present illness.    All 14 point review of systems negative except as described per history of present illness  EKGs/Labs/Other Studies Reviewed:      Recent Labs: 09/19/2017: BUN 12; Creatinine, Ser 0.98; Potassium 4.4; Sodium 139  Recent Lipid Panel No results found for: CHOL, TRIG, HDL, CHOLHDL, VLDL, LDLCALC, LDLDIRECT  Physical Exam:    VS:  BP 138/88 (BP Location: Right Arm, Patient Position: Sitting, Cuff Size: Normal)   Pulse 84   Ht 5\' 5"  (1.651 m)   Wt 155 lb (70.3 kg)   SpO2 97%   BMI 25.79 kg/m     Wt Readings from Last 3 Encounters:  10/13/17 155 lb (70.3 kg)  09/19/17 153 lb (69.4 kg)      GEN:  Well nourished, well developed in no acute distress HEENT: Normal NECK: No JVD; No carotid bruits LYMPHATICS: No lymphadenopathy CARDIAC: Irregularly irregular, no murmurs, no rubs, no gallops RESPIRATORY:  Clear to auscultation without rales, wheezing or rhonchi  ABDOMEN: Soft, non-tender, non-distended MUSCULOSKELETAL:  No edema; No deformity  SKIN: Warm and dry LOWER EXTREMITIES: no swelling NEUROLOGIC:  Alert and oriented x 3 PSYCHIATRIC:  Normal affect   ASSESSMENT:    1. Carotid artery stenosis with cerebral infarction ([place])   2. Essential hypertension   3. Subclavian artery stenosis (HCC)   4. Dyslipidemia    PLAN:    In order of problems listed above:  1. Persistent atrial fibrillation: EKG will be done to confirm it continue anticoagulation will do cardioversion. 2. Essential hypertension: Blood pressure well controlled continue present management. 3. Subclavian artery stenosis we will continue monitoring.  Carotid ultrasounds will be done today. 4. His lipidemia on statin.   Medication Adjustments/Labs and Tests Ordered: Current medicines are reviewed at length with the patient today.  Concerns regarding medicines are outlined above.  No orders of the defined types were placed in this encounter.  Medication changes: No orders of the defined types were placed in this encounter.   Signed, Park Liter, MD, [facility] 10/13/2017 2:17 PM    [facility]

## 2017-10-14 LAB — BASIC METABOLIC PANEL
BUN/Creatinine Ratio: 11 — ABNORMAL LOW (ref 12–28)
BUN: 8 mg/dL (ref 8–27)
CALCIUM: 9 mg/dL (ref 8.7–10.3)
CO2: 25 mmol/L (ref 20–29)
Chloride: 103 mmol/L (ref 96–106)
Creatinine, Ser: 0.76 mg/dL (ref 0.57–1.00)
GFR, EST AFRICAN AMERICAN: 85 mL/min/{1.73_m2} (ref >59)
GFR, EST NON AFRICAN AMERICAN: 74 mL/min/{1.73_m2} (ref >59)
Glucose: 118 mg/dL — ABNORMAL HIGH (ref 65–99)
POTASSIUM: 4.4 mmol/L (ref 3.5–5.2)
SODIUM: 142 mmol/L (ref 134–144)

## 2017-10-14 LAB — CBC
HEMATOCRIT: 37 % (ref 34.0–46.6)
Hemoglobin: 12.1 g/dL (ref 11.1–15.9)
MCH: 29 pg (ref 26.6–33.0)
MCHC: 32.7 g/dL (ref 31.5–35.7)
MCV: 89 fL (ref 79–97)
Platelets: 252 10*3/uL (ref 150–379)
RBC: 4.17 x10E6/uL (ref 3.77–5.28)
RDW: 14.5 % (ref 12.3–15.4)
WBC: 6.1 10*3/uL (ref 3.4–10.8)

## 2017-10-16 NOTE — Anesthesia Preprocedure Evaluation (Addendum)
Anesthesia Evaluation  Patient identified by MRN, date of birth, ID band Patient awake    Reviewed: Allergy & Precautions, NPO status , Patient's Chart, lab work & pertinent test results  Airway Mallampati: II  TM Distance: >3 FB Neck ROM: Full    Dental no notable dental hx.    Pulmonary former smoker,    Pulmonary exam normal breath sounds clear to auscultation       Cardiovascular hypertension, Normal cardiovascular exam+ dysrhythmias Atrial Fibrillation  Rhythm:Regular Rate:Normal     Neuro/Psych    GI/Hepatic   Endo/Other  diabetes  Renal/GU      Musculoskeletal   Abdominal   Peds  Hematology   Anesthesia Other Findings   Reproductive/Obstetrics                            Anesthesia Physical Anesthesia Plan  ASA: III  Anesthesia Plan: General   Post-op Pain Management:    Induction: Intravenous  PONV Risk Score and Plan:   Airway Management Planned: Mask  Additional Equipment:   Intra-op Plan:   Post-operative Plan:   Informed Consent: I have reviewed the patients History and Physical, chart, labs and discussed the procedure including the risks, benefits and alternatives for the proposed anesthesia with the patient or authorized representative who has indicated his/her understanding and acceptance.     Plan Discussed with: CRNA  Anesthesia Plan Comments:         Anesthesia Quick Evaluation

## 2017-10-17 ENCOUNTER — Encounter (HOSPITAL_COMMUNITY)
Admission: RE | Disposition: A | Discharge status: Home / Self Care | Payer: Self-pay | Source: Ambulatory Visit | Attending: Cardiovascular Disease

## 2017-10-17 ENCOUNTER — Ambulatory Visit (HOSPITAL_COMMUNITY): Payer: Medicare Other | Admitting: Anesthesiology

## 2017-10-17 ENCOUNTER — Other Ambulatory Visit: Payer: Self-pay

## 2017-10-17 ENCOUNTER — Encounter (HOSPITAL_COMMUNITY): Payer: Self-pay | Admitting: *Deleted

## 2017-10-17 ENCOUNTER — Ambulatory Visit (HOSPITAL_COMMUNITY)
Admission: RE | Admit: 2017-10-17 | Discharge: 2017-10-17 | Disposition: A | Discharge status: Home / Self Care | Payer: Medicare Other | Source: Ambulatory Visit | Attending: Cardiovascular Disease | Admitting: Cardiovascular Disease

## 2017-10-17 DIAGNOSIS — Z87891 Personal history of nicotine dependence: Secondary | ICD-10-CM | POA: Diagnosis not present

## 2017-10-17 DIAGNOSIS — F329 Major depressive disorder, single episode, unspecified: Secondary | ICD-10-CM | POA: Insufficient documentation

## 2017-10-17 DIAGNOSIS — Z8673 Personal history of transient ischemic attack (TIA), and cerebral infarction without residual deficits: Secondary | ICD-10-CM | POA: Diagnosis not present

## 2017-10-17 DIAGNOSIS — M810 Age-related osteoporosis without current pathological fracture: Secondary | ICD-10-CM | POA: Insufficient documentation

## 2017-10-17 DIAGNOSIS — E785 Hyperlipidemia, unspecified: Secondary | ICD-10-CM | POA: Diagnosis not present

## 2017-10-17 DIAGNOSIS — I481 Persistent atrial fibrillation: Secondary | ICD-10-CM | POA: Insufficient documentation

## 2017-10-17 DIAGNOSIS — Z7901 Long term (current) use of anticoagulants: Secondary | ICD-10-CM | POA: Diagnosis not present

## 2017-10-17 DIAGNOSIS — I4891 Unspecified atrial fibrillation: Secondary | ICD-10-CM | POA: Diagnosis not present

## 2017-10-17 DIAGNOSIS — I6522 Occlusion and stenosis of left carotid artery: Secondary | ICD-10-CM | POA: Diagnosis not present

## 2017-10-17 DIAGNOSIS — I10 Essential (primary) hypertension: Secondary | ICD-10-CM | POA: Diagnosis not present

## 2017-10-17 DIAGNOSIS — I708 Atherosclerosis of other arteries: Secondary | ICD-10-CM | POA: Diagnosis not present

## 2017-10-17 DIAGNOSIS — E1151 Type 2 diabetes mellitus with diabetic peripheral angiopathy without gangrene: Secondary | ICD-10-CM | POA: Insufficient documentation

## 2017-10-17 DIAGNOSIS — Z79899 Other long term (current) drug therapy: Secondary | ICD-10-CM | POA: Insufficient documentation

## 2017-10-17 DIAGNOSIS — I4819 Other persistent atrial fibrillation: Secondary | ICD-10-CM

## 2017-10-17 DIAGNOSIS — I739 Peripheral vascular disease, unspecified: Secondary | ICD-10-CM | POA: Diagnosis not present

## 2017-10-17 HISTORY — PX: CARDIOVERSION: SHX1299

## 2017-10-17 SURGERY — CARDIOVERSION
Anesthesia: General

## 2017-10-17 MED ORDER — SODIUM CHLORIDE 0.9 % IV SOLN
INTRAVENOUS | Status: DC | PRN
  Administered 2017-10-17: 08:00:00 via INTRAVENOUS

## 2017-10-17 MED ORDER — PROPOFOL 10 MG/ML IV BOLUS
INTRAVENOUS | Status: DC | PRN
  Administered 2017-10-17: 70 mg via INTRAVENOUS

## 2017-10-17 MED ORDER — LIDOCAINE 2% (20 MG/ML) 5 ML SYRINGE
INTRAMUSCULAR | Status: DC | PRN
  Administered 2017-10-17: 80 mg via INTRAVENOUS

## 2017-10-17 NOTE — Anesthesia Procedure Notes (Signed)
Procedure Name: General with mask airway Date/Time: 10/17/2017 8:43 AM Performed by: Wilburn Cornelia, CRNA Pre-anesthesia Checklist: Patient identified, Emergency Drugs available, Suction available, Patient being monitored and Timeout performed Patient Re-evaluated:Patient Re-evaluated prior to induction Oxygen Delivery Method: Ambu bag Preoxygenation: Pre-oxygenation with 100% oxygen Induction Type: IV induction Ventilation: Mask ventilation without difficulty Placement Confirmation: CO2 detector and breath sounds checked- equal and bilateral Dental Injury: Teeth and Oropharynx as per pre-operative assessment

## 2017-10-17 NOTE — Discharge Instructions (Signed)
Electrical Cardioversion, Care After °This sheet gives you information about how to care for yourself after your procedure. Your health care provider may also give you more specific instructions. If you have problems or questions, contact your health care provider. °What can I expect after the procedure? °After the procedure, it is common to have: °· Some redness on the skin where the shocks were given. ° °Follow these instructions at home: °· Do not drive for 24 hours if you were given a medicine to help you relax (sedative). °· Take over-the-counter and prescription medicines only as told by your health care provider. °· Ask your health care provider how to check your pulse. Check it often. °· Rest for 48 hours after the procedure or as told by your health care provider. °· Avoid or limit your caffeine use as told by your health care provider. °Contact a health care provider if: °· You feel like your heart is beating too quickly or your pulse is not regular. °· You have a serious muscle cramp that does not go away. °Get help right away if: °· You have discomfort in your chest. °· You are dizzy or you feel faint. °· You have trouble breathing or you are short of breath. °· Your speech is slurred. °· You have trouble moving an arm or leg on one side of your body. °· Your fingers or toes turn cold or blue. °This information is not intended to replace advice given to you by your health care provider. Make sure you discuss any questions you have with your health care provider. °Document Released: 05/09/2013 Document Revised: 02/20/2016 Document Reviewed: 01/23/2016 °Elsevier Interactive Patient Education © 2018 [employer] ° °

## 2017-10-17 NOTE — Transfer of Care (Signed)
Immediate Anesthesia Transfer of Care Note  Patient: Brooke Mullins  Procedure(s) Performed: CARDIOVERSION (N/A )  Patient Location: Endoscopy Unit  Anesthesia Type:General  Level of Consciousness: awake and alert   Airway & Oxygen Therapy: Patient Spontanous Breathing  Post-op Assessment: Report given to RN and Post -op Vital signs reviewed and stable  Post vital signs: Reviewed and stable  Last Vitals:  Vitals:   10/17/17 0819  BP: (!) 198/93  Resp: (!) 23  Temp: 36.6 C  SpO2: 98%    Last Pain:  Vitals:   10/17/17 0819  TempSrc: Oral         Complications: No apparent anesthesia complications

## 2017-10-17 NOTE — Op Note (Signed)
Procedure: Electrical Cardioversion Indications:  Atrial Fibrillation  Procedure Details:  Consent: Risks of procedure as well as the alternatives and risks of each were explained to the (patient/caregiver).  Consent for procedure obtained.  Time Out: Verified patient identification, verified procedure, site/side was marked, verified correct patient position, special equipment/implants available, medications/allergies/relevent history reviewed, required imaging and test results available.  Performed  Patient placed on cardiac monitor, pulse oximetry, supplemental oxygen as necessary.  Sedation given: propofol 70 mg IV Pacer pads placed anterior and posterior chest.  Cardioverted 1 time(s).  Cardioversion with synchronized biphasic 120J shock.  Evaluation: Findings: Post procedure EKG shows: NSR with PACs Complications: None Patient did tolerate procedure well.  Time Spent Directly with the Patient:  30 minutes   Brooke Mullins 10/17/2017, 8:45 AM

## 2017-10-17 NOTE — Anesthesia Postprocedure Evaluation (Signed)
Anesthesia Post Note  Patient: Brooke Mullins  Procedure(s) Performed: CARDIOVERSION (N/A )     Patient location during evaluation: Endoscopy Anesthesia Type: General Level of consciousness: awake and alert Pain management: pain level controlled Vital Signs Assessment: post-procedure vital signs reviewed and stable Respiratory status: spontaneous breathing, nonlabored ventilation, respiratory function stable and patient connected to nasal cannula oxygen Cardiovascular status: blood pressure returned to baseline and stable Postop Assessment: no apparent nausea or vomiting Anesthetic complications: no    Last Vitals:  Vitals:   10/17/17 0920 10/17/17 0930  BP: (!) 152/71 (!) 142/57  Pulse: 72 73  Resp: 17 18  Temp:    SpO2: 99% 99%    Last Pain:  Vitals:   10/17/17 0853  TempSrc: Oral                 Barnet Glasgow

## 2017-10-17 NOTE — Interval H&P Note (Signed)
History and Physical Interval Note:  10/17/2017 8:16 AM  Brooke Mullins  has presented today for surgery, with the diagnosis of A-FIB  The various methods of treatment have been discussed with the patient and family. After consideration of risks, benefits and other options for treatment, the patient has consented to  Procedure(s): CARDIOVERSION (N/A) as a surgical intervention .  The patient's history has been reviewed, patient examined, no change in status, stable for surgery.  I have reviewed the patient's chart and labs.  Questions were answered to the patient's satisfaction.     Katharina Jehle

## 2017-10-20 ENCOUNTER — Ambulatory Visit: Payer: Medicare Other | Admitting: Cardiology

## 2017-11-09 DIAGNOSIS — L821 Other seborrheic keratosis: Secondary | ICD-10-CM | POA: Diagnosis not present

## 2017-11-09 DIAGNOSIS — L570 Actinic keratosis: Secondary | ICD-10-CM | POA: Diagnosis not present

## 2017-11-09 DIAGNOSIS — L578 Other skin changes due to chronic exposure to nonionizing radiation: Secondary | ICD-10-CM | POA: Diagnosis not present

## 2017-11-09 DIAGNOSIS — R233 Spontaneous ecchymoses: Secondary | ICD-10-CM | POA: Diagnosis not present

## 2017-11-09 DIAGNOSIS — C44729 Squamous cell carcinoma of skin of left lower limb, including hip: Secondary | ICD-10-CM | POA: Diagnosis not present

## 2017-11-09 DIAGNOSIS — D0472 Carcinoma in situ of skin of left lower limb, including hip: Secondary | ICD-10-CM | POA: Diagnosis not present

## 2017-11-14 ENCOUNTER — Ambulatory Visit (INDEPENDENT_AMBULATORY_CARE_PROVIDER_SITE_OTHER): Payer: Medicare Other | Admitting: Cardiology

## 2017-11-14 ENCOUNTER — Encounter: Payer: Self-pay | Admitting: Cardiology

## 2017-11-14 VITALS — BP 116/60 | HR 71 | Ht 65.0 in | Wt 153.4 lb

## 2017-11-14 DIAGNOSIS — I481 Persistent atrial fibrillation: Secondary | ICD-10-CM

## 2017-11-14 DIAGNOSIS — E785 Hyperlipidemia, unspecified: Secondary | ICD-10-CM | POA: Diagnosis not present

## 2017-11-14 DIAGNOSIS — I63239 Cerebral infarction due to unspecified occlusion or stenosis of unspecified carotid arteries: Secondary | ICD-10-CM | POA: Diagnosis not present

## 2017-11-14 DIAGNOSIS — F329 Major depressive disorder, single episode, unspecified: Secondary | ICD-10-CM

## 2017-11-14 DIAGNOSIS — I4819 Other persistent atrial fibrillation: Secondary | ICD-10-CM

## 2017-11-14 DIAGNOSIS — F32A Depression, unspecified: Secondary | ICD-10-CM

## 2017-11-14 DIAGNOSIS — I10 Essential (primary) hypertension: Secondary | ICD-10-CM

## 2017-11-14 NOTE — Patient Instructions (Signed)
Medication Instructions:  Your physician recommends that you continue on your current medications as directed. Please refer to the Current Medication list given to you today.   Labwork: None  Testing/Procedures: You had an EKG today.   Follow-Up: Your physician wants you to follow-up in: 3 months. You will receive a reminder letter in the mail two months in advance. If you don't receive a letter, please call our office to schedule the follow-up appointment.   Any Other Special Instructions Will Be Listed Below (If Applicable).  You have been referred to see an electrophysiologist at the [facility] office. You will be contacted to schedule an appointment.     If you need a refill on your cardiac medications before your next appointment, please call your pharmacy.

## 2017-11-14 NOTE — Progress Notes (Signed)
Cardiology Office Note:    Date:  11/14/2017   ID:  Brooke Mullins, DOB 1934-06-26, MRN [id number]  PCP:  [name], MD  Cardiologist:  [name], MD    Referring MD: [name], MD   Chief Complaint  Patient presents with  . 1 month follow up  I had cardioversion  History of Present Illness:    Brooke Mullins is a 84 y.o. female she had persistent atrial fibrillation a few days ago she went to [facility] for elective cardioversion comes today to my office for follow-up is feeling better and have more energy however auscultation on palpation of her pulse showed some irregularity when I do EKG to check and see if she is normal rhythm she is anticoagulated and I stressed importance of taking anticoagulation she understands why she is doing this and will continue.  Otherwise she seems to be doing fine. EKG has been done and sadly looks like she is back to atrial fibrillation.  Rate appears to be controlled.  QRS complex morphology showing persistent left bundle branch block which is chronic.  Did review her test from before.  In February she got stress test which showed no evidence of ischemia she also had echocardiogram which showed mildly enlarged left atrium ejection fraction has been assessed as 50-55%.  I did review her carotid ultrasounds which showed completely occluded right carotid artery.  There was no critical lesion on the left side.  I think the best option in this situation will be to refer her to EP team for consideration of antiarrhythmic therapy.  Past Medical History:  Diagnosis Date  . Atrial fibrillation ([place])   . Carotid artery stenosis with cerebral infarction ([place])   . Carotid atherosclerosis, bilateral   . Depression   . Diabetes mellitus ([place])   . Dyslipidemia   . Edema   . Fracture of femoral neck, right ([place])   . Hyperplastic colon polyp   . Hypertension   . Osteoporosis   . Peripheral vascular disease of extremity ([place])   . PSVT  (paroxysmal supraventricular tachycardia) ([place])   . Subclavian artery stenosis [facility])     Past Surgical History:  Procedure Laterality Date  . ABDOMINAL HYSTERECTOMY    . CARDIOVERSION N/A 10/17/2017   Procedure: CARDIOVERSION;  Surgeon: Sanda Klein, MD;  Location: [facility] ENDOSCOPY;  Service: Cardiovascular;  Laterality: N/A;    Current Medications: Current Meds  Medication Sig  . acetaminophen (TYLENOL) 500 MG tablet Take 500 mg by mouth every 6 (six) hours as needed (for pain/headaches.).  Marland Kitchen albuterol (PROAIR HFA) 108 (90 Base) MCG/ACT inhaler Inhale 1-2 puffs into the lungs every 6 (six) hours as needed for wheezing or shortness of breath.   . citalopram (CELEXA) 20 MG tablet Take 20 mg by mouth at bedtime.   Marland Kitchen ELIQUIS 5 MG TABS tablet Take 5 mg by mouth 2 (two) times daily.  . fluticasone (FLONASE) 50 MCG/ACT nasal spray Place 1 spray into both nostrils at bedtime.  . furosemide (LASIX) 20 MG tablet Take 20 mg by mouth daily as needed (for fluid in legs.).   Marland Kitchen metoprolol tartrate (LOPRESSOR) 25 MG tablet Take 1 tablet (25 mg total) by mouth 2 (two) times daily.  Marland Kitchen omeprazole (PRILOSEC) 20 MG capsule Take 20 mg by mouth at bedtime.   . quinapril (ACCUPRIL) 40 MG tablet TAKE ONE TABLET EVERY DAY AT BEDTIME     Allergies:   Penicillins; Atorvastatin; Codeine; Colesevelam; and Ezetimibe   Social  History   Socioeconomic History  . Marital status: Married    Spouse name: Not on file  . Number of children: Not on file  . Years of education: Not on file  . Highest education level: Not on file  Occupational History  . Not on file  Social Needs  . Financial resource strain: Not on file  . Food insecurity:    Worry: Not on file    Inability: Not on file  . Transportation needs:    Medical: Not on file    Non-medical: Not on file  Tobacco Use  . Smoking status: Former Research scientist (life sciences)  . Smokeless tobacco: Never Used  Substance and Sexual Activity  . Alcohol use: No    Frequency: Never    . Drug use: No  . Sexual activity: Not on file  Lifestyle  . Physical activity:    Days per week: Not on file    Minutes per session: Not on file  . Stress: Not on file  Relationships  . Social connections:    Talks on phone: Not on file    Gets together: Not on file    Attends religious service: Not on file    Active member of club or organization: Not on file    Attends meetings of clubs or organizations: Not on file    Relationship status: Not on file  Other Topics Concern  . Not on file  Social History Narrative  . Not on file     Family History: The patient's family history includes COPD in her father; Cancer in her brother; Heart failure in her mother; Hypertension in her brother. ROS:   Please see the history of present illness.    All 14 point review of systems negative except as described per history of present illness  EKGs/Labs/Other Studies Reviewed:      Recent Labs: 10/13/2017: BUN 8; Creatinine, Ser 0.76; Hemoglobin 12.1; Platelets 252; Potassium 4.4; Sodium 142  Recent Lipid Panel No results found for: CHOL, TRIG, HDL, CHOLHDL, VLDL, LDLCALC, LDLDIRECT  Physical Exam:    VS:  BP 116/60   Pulse 71   Ht 5\' 5"  (1.651 m)   Wt 153 lb 6.4 oz (69.6 kg)   SpO2 97%   BMI 25.53 kg/m     Wt Readings from Last 3 Encounters:  11/14/17 153 lb 6.4 oz (69.6 kg)  10/17/17 155 lb (70.3 kg)  10/13/17 155 lb (70.3 kg)     GEN:  Well nourished, well developed in no acute distress HEENT: Normal NECK: No JVD; No carotid bruits LYMPHATICS: No lymphadenopathy CARDIAC: Irregularly irregular, no murmurs, no rubs, no gallops RESPIRATORY:  Clear to auscultation without rales, wheezing or rhonchi  ABDOMEN: Soft, non-tender, non-distended MUSCULOSKELETAL:  No edema; No deformity  SKIN: Warm and dry LOWER EXTREMITIES: no swelling NEUROLOGIC:  Alert and oriented x 3 PSYCHIATRIC:  Normal affect   ASSESSMENT:    1. Carotid artery stenosis with cerebral infarction  ([place])   2. Essential hypertension   3. Persistent atrial fibrillation ([place])   4. Dyslipidemia   5. Depression, unspecified depression type    PLAN:    In order of problems listed above:  1. Carotid artery stenosis with completely occluded right cardiac artery.  Stable noted 2. Essential hypertension: Stable and appropriate medications which I will continue. 3. Paroxysmal now persistent atrial fibrillation.  Status post cardioversion however she is back to atrial fibrillation.  I will refer her to EP team for consideration of antiarrhythmic therapy or  possibly ablation 4. Dyslipidemia: I will contact her primary care physician to get fasting lipid profile 5. Depression stable doing well   Medication Adjustments/Labs and Tests Ordered: Current medicines are reviewed at length with the patient today.  Concerns regarding medicines are outlined above.  No orders of the defined types were placed in this encounter.  Medication changes: No orders of the defined types were placed in this encounter.   Signed, Park Liter, MD, [facility] 11/14/2017 2:37 PM    [place]

## 2017-11-30 ENCOUNTER — Encounter: Payer: Self-pay | Admitting: Cardiology

## 2017-11-30 ENCOUNTER — Ambulatory Visit (INDEPENDENT_AMBULATORY_CARE_PROVIDER_SITE_OTHER): Payer: Medicare Other | Admitting: Cardiology

## 2017-11-30 VITALS — BP 152/87 | HR 82 | Resp 97 | Ht 65.0 in | Wt 154.0 lb

## 2017-11-30 DIAGNOSIS — I6521 Occlusion and stenosis of right carotid artery: Secondary | ICD-10-CM

## 2017-11-30 DIAGNOSIS — I10 Essential (primary) hypertension: Secondary | ICD-10-CM | POA: Diagnosis not present

## 2017-11-30 DIAGNOSIS — I481 Persistent atrial fibrillation: Secondary | ICD-10-CM

## 2017-11-30 DIAGNOSIS — I4819 Other persistent atrial fibrillation: Secondary | ICD-10-CM

## 2017-11-30 MED ORDER — AMIODARONE HCL 200 MG PO TABS: ORAL_TABLET | ORAL | 0 refills | Status: DC

## 2017-11-30 MED ORDER — AMIODARONE HCL 200 MG PO TABS: 200.0000 mg | ORAL_TABLET | Freq: Every day | ORAL | 1 refills | Status: DC

## 2017-11-30 NOTE — Progress Notes (Signed)
Electrophysiology Office Note   Date:  11/30/2017   ID:  Brooke Mullins, DOB 01-28-1934, MRN [id number]  PCP:  Brooke Dress, MD  Cardiologist:  Brooke Mullins Primary Electrophysiologist:  Brooke Meredith Leeds, MD    Chief Complaint  Patient presents with  . Advice Only    Persistent Afib     History of Present Illness: Brooke Mullins is a 84 y.o. female who is being seen today for the evaluation of atrial fibrillation at the request of Brooke Mullins. Presenting today for electrophysiology evaluation.  Has a history of persistent atrial fibrillation, carotid artery disease with 100% occluded right carotid, diabetes, hyperlipidemia, hypertension, and PSVT.  She had an elective cardioversion on 10/17/2017.  She showed up to clinic on 11/14/2017 in atrial fibrillation.    Today, she denies symptoms of palpitations, chest pain, shortness of breath, orthopnea, PND, lower extremity edema, claudication, dizziness, presyncope, syncope, bleeding, or neurologic sequela. The patient is tolerating medications without difficulties.  Complaint today is of weakness and fatigue.  She says that she knew when she went back into atrial fibrillation.  She felt well for a few days after cardioversion.   Past Medical History:  Diagnosis Date  . Atrial fibrillation ([place])   . Carotid artery stenosis with cerebral infarction ([place])   . Carotid atherosclerosis, bilateral   . Depression   . Diabetes mellitus ([place])   . Dyslipidemia   . Edema   . Fracture of femoral neck, right ([place])   . Hyperplastic colon polyp   . Hypertension   . Osteoporosis   . Peripheral vascular disease of extremity ([place])   . PSVT (paroxysmal supraventricular tachycardia) ([place])   . Subclavian artery stenosis [facility])    Past Surgical History:  Procedure Laterality Date  . ABDOMINAL HYSTERECTOMY    . CARDIOVERSION N/A 10/17/2017   Procedure: CARDIOVERSION;  Surgeon: Brooke Klein, MD;  Location: [facility] ENDOSCOPY;  Service:  Cardiovascular;  Laterality: N/A;     Current Outpatient Medications  Medication Sig Dispense Refill  . acetaminophen (TYLENOL) 500 MG tablet Take 500 mg by mouth every 6 (six) hours as needed (for pain/headaches.).    Marland Kitchen albuterol (PROAIR HFA) 108 (90 Base) MCG/ACT inhaler Inhale 1-2 puffs into the lungs every 6 (six) hours as needed for wheezing or shortness of breath.     . citalopram (CELEXA) 20 MG tablet Take 20 mg by mouth at bedtime.     Marland Kitchen ELIQUIS 5 MG TABS tablet Take 5 mg by mouth 2 (two) times daily.  0  . fluticasone (FLONASE) 50 MCG/ACT nasal spray Place 1 spray into both nostrils at bedtime.    . furosemide (LASIX) 20 MG tablet Take 20 mg by mouth daily as needed (for fluid in legs.).     Marland Kitchen metoprolol tartrate (LOPRESSOR) 25 MG tablet Take 1 tablet (25 mg total) by mouth 2 (two) times daily. 180 tablet 3  . omeprazole (PRILOSEC) 20 MG capsule Take 20 mg by mouth at bedtime.   12  . quinapril (ACCUPRIL) 40 MG tablet TAKE ONE TABLET EVERY DAY AT BEDTIME     No current facility-administered medications for this visit.     Allergies:   Penicillins; Atorvastatin; Codeine; Colesevelam; and Ezetimibe   Social History:  The patient  reports that she has quit smoking. She has never used smokeless tobacco. She reports that she does not drink alcohol or use drugs.   Family History:  The patient's family history includes COPD in her father; Cancer in  her brother; Heart failure in her mother; Hypertension in her brother.    ROS:  Please see the history of present illness.   Otherwise, review of systems is positive for weakness, fatigue, shortness of breath.   All other systems are reviewed and negative.    PHYSICAL EXAM: VS:  BP (!) 152/87   Pulse 82   Resp (!) 97   Ht 5\' 5"  (1.651 m)   Wt 154 lb (69.9 kg)   BMI 25.63 kg/m  , BMI Body mass index is 25.63 kg/m. GEN: Well nourished, well developed, in no acute distress  HEENT: normal  Neck: no JVD, carotid bruits, or  masses Cardiac: iRRR; no murmurs, rubs, or gallops,no edema  Respiratory:  clear to auscultation bilaterally, normal work of breathing GI: soft, nontender, nondistended, + BS MS: no deformity or atrophy  Skin: warm and dry Neuro:  Strength and sensation are intact Psych: euthymic mood, full affect  EKG:  EKG is not ordered today. Personal review of the ekg ordered 11/14/17 shows she Brooke fibrillation, left bundle branch block, rate 79  Recent Labs: 10/13/2017: BUN 8; Creatinine, Ser 0.76; Hemoglobin 12.1; Platelets 252; Potassium 4.4; Sodium 142    Lipid Panel  No results found for: CHOL, TRIG, HDL, CHOLHDL, VLDL, LDLCALC, LDLDIRECT   Wt Readings from Last 3 Encounters:  11/30/17 154 lb (69.9 kg)  11/14/17 153 lb 6.4 oz (69.6 kg)  10/17/17 155 lb (70.3 kg)      Other studies Reviewed: Additional studies/ records that were reviewed today include: TTE 09/05/2017 Review of the above records today demonstrates:  Mild concentric LVH EF 50 to 91% Impaired diastolic filling Mildly dilated left atrium Mild to moderate mitral annular calcification Mild mitral regurgitation   ASSESSMENT AND PLAN:  1.  Persistent atrial fibrillation: Currently on Eliquis.  Had recent cardioversion with quick return to atrial fibrillation.  I discussed with the possibility of antiarrhythmics versus ablation.  Risks and benefits of ablation were discussed and include bleeding, tamponade, heart block, stroke, damage to surrounding organs.  I also discussed with her options of dofetilide and amiodarone.  She would like to start amiodarone at this time.  We Brooke plan for cardioversion 2 weeks after amiodarone initiation.  This patients CHA2DS2-VASc Score and unadjusted Ischemic Stroke Rate (% per year) is equal to 9.7 % stroke rate/year from a score of 6  Above score calculated as 1 point each if present [CHF, HTN, DM, Vascular=MI/PAD/Aortic Plaque, Age if 65-74, or Female] Above score calculated as 2  points each if present [Age > 75, or Stroke/TIA/TE]  2.  Hypertension: Blood pressure elevated today.  It is been more normal in the past.  No changes.  3.  Carotid artery disease: 100% occlusion.  Currently on optimal medical therapy.  Plan per primary cardiology.  Current medicines are reviewed at length with the patient today.   The patient does not have concerns regarding her medicines.  The following changes were made today:  none  Labs/ tests ordered today include:  No orders of the defined types were placed in this encounter.  Case discussed with primary cardiology  Disposition:   FU with Brooke Camnitz 3 months  Signed, Brooke Meredith Leeds, MD  11/30/2017 3:32 PM     [place] [address] [place] [address] [phone] (office) [phone] (fax)

## 2017-11-30 NOTE — Patient Instructions (Addendum)
Medication Instructions:  Your physician has recommended you make the following change in your medication:  1. START Amiodarone  --- DO NOT START THIS MEDICATION UNTIL THE NURSE INSTRUCTS YOU TO.  - take 2 tablets (400 mg total) twice a day for 2 weeks, then  - take 1 tablet (200 mg total ) twice a day for 2 weeks, then  - take 1 tablet once daily  *PLEASE TALK WITH YOUR PRIMARY DOCTOR ABOUT SWITCHING CELEXA TO A DIFFERENT MEDICATION -- POSSIBLY ESCITALOPRAM*  Labwork: None ordered  Testing/Procedures: Your physician has recommended that you have a Cardioversion (DCCV). Electrical Cardioversion uses a jolt of electricity to your heart either through paddles or wired patches attached to your chest. This is a controlled, usually prescheduled, procedure. Defibrillation is done under light anesthesia in the hospital, and you usually go home the day of the procedure. This is done to get your heart back into a normal rhythm. You are not awake for the procedure. Cardioversion Instructions   Please arrive at the [facility] main entrance of [facility] on ______ at  _______ Do not eat or drink after midnight prior to procedure Hold your Lasix the morning of this procedure Take your remaining medications as normal the morning of your procedure with a sip of water. You will need someone to drive you home at discharge   Follow-Up: Your physician recommends that you schedule a follow-up appointment in: 3 months with Dr. Curt Bears.   * If you need a refill on your cardiac medications before your next appointment, please call your pharmacy.   *Please note that any paperwork needing to be filled out by the provider will need to be addressed at the front desk prior to seeing the provider. Please note that any FMLA, disability or other documents regarding health condition is subject to a $25.00 charge that must be received prior to completion of paperwork in the form of a money order or  check.  Thank you for choosing [facility]!!   Trinidad Curet, RN [phone]  Any Other Special Instructions Will Be Listed Below (If Applicable).  Amiodarone tablets What is this medicine? AMIODARONE (a MEE oh da rone) is an antiarrhythmic drug. It helps make your heart beat regularly. Because of the side effects caused by this medicine, it is only used when other medicines have not worked. It is usually used for heartbeat problems that may be life threatening. This medicine may be used for other purposes; ask your health care provider or pharmacist if you have questions. COMMON BRAND NAME(S): Cordarone, Pacerone What should I tell my health care provider before I take this medicine? They need to know if you have any of these conditions: -liver disease -lung disease -other heart problems -thyroid disease -an unusual or allergic reaction to amiodarone, iodine, other medicines, foods, dyes, or preservatives -pregnant or trying to get pregnant -breast-feeding How should I use this medicine? Take this medicine by mouth with a glass of water. Follow the directions on the prescription label. You can take this medicine with or without food. However, you should always take it the same way each time. Take your doses at regular intervals. Do not take your medicine more often than directed. Do not stop taking except on the advice of your doctor or health care professional. A special MedGuide will be given to you by the pharmacist with each prescription and refill. Be sure to read this information carefully each time. Talk to your pediatrician regarding the use  of this medicine in children. Special care may be needed. Overdosage: If you think you have taken too much of this medicine contact a poison control center or emergency room at once. NOTE: This medicine is only for you. Do not share this medicine with others. What if I miss a dose? If you miss a dose, take it as soon as you can. If it  is almost time for your next dose, take only that dose. Do not take double or extra doses. What may interact with this medicine? Do not take this medicine with any of the following medications: -abarelix -apomorphine -arsenic trioxide -certain antibiotics like erythromycin, gemifloxacin, levofloxacin, pentamidine -certain medicines for depression like amoxapine, tricyclic antidepressants -certain medicines for fungal infections like fluconazole, itraconazole, ketoconazole, posaconazole, voriconazole -certain medicines for irregular heart beat like disopyramide, dofetilide, dronedarone, ibutilide, propafenone, sotalol -certain medicines for malaria like chloroquine, halofantrine -cisapride -droperidol -haloperidol -hawthorn -maprotiline -methadone -phenothiazines like chlorpromazine, mesoridazine, thioridazine -pimozide -ranolazine -red yeast rice -vardenafil -ziprasidone This medicine may also interact with the following medications: -antiviral medicines for HIV or AIDS -certain medicines for blood pressure, heart disease, irregular heart beat -certain medicines for cholesterol like atorvastatin, cerivastatin, lovastatin, simvastatin -certain medicines for hepatitis C like sofosbuvir and ledipasvir; sofosbuvir -certain medicines for seizures like phenytoin -certain medicines for thyroid problems -certain medicines that treat or prevent blood clots like warfarin -cholestyramine -cimetidine -clopidogrel -cyclosporine -dextromethorphan -diuretics -fentanyl -general anesthetics -grapefruit juice -lidocaine -loratadine -methotrexate -other medicines that prolong the QT interval (cause an abnormal heart rhythm) -procainamide -quinidine -rifabutin, rifampin, or rifapentine -St. John's Wort -trazodone This list may not describe all possible interactions. Give your health care provider a list of all the medicines, herbs, non-prescription drugs, or dietary supplements you use.  Also tell them if you smoke, drink alcohol, or use illegal drugs. Some items may interact with your medicine. What should I watch for while using this medicine? Your condition will be monitored closely when you first begin therapy. Often, this drug is first started in a hospital or other monitored health care setting. Once you are on maintenance therapy, visit your doctor or health care professional for regular checks on your progress. Because your condition and use of this medicine carry some risk, it is a good idea to carry an identification card, necklace or bracelet with details of your condition, medications, and doctor or health care professional. Dennis Bast may get drowsy or dizzy. Do not drive, use machinery, or do anything that needs mental alertness until you know how this medicine affects you. Do not stand or sit up quickly, especially if you are an older patient. This reduces the risk of dizzy or fainting spells. This medicine can make you more sensitive to the sun. Keep out of the sun. If you cannot avoid being in the sun, wear protective clothing and use sunscreen. Do not use sun lamps or tanning beds/booths. You should have regular eye exams before and during treatment. Call your doctor if you have blurred vision, see halos, or your eyes become sensitive to light. Your eyes may get dry. It may be helpful to use a lubricating eye solution or artificial tears solution. If you are going to have surgery or a procedure that requires contrast dyes, tell your doctor or health care professional that you are taking this medicine. What side effects may I notice from receiving this medicine? Side effects that you should report to your doctor or health care professional as soon as possible: -allergic reactions like skin rash, itching  or hives, swelling of the face, lips, or tongue -blue-gray coloring of the skin -blurred vision, seeing blue green halos, increased sensitivity of the eyes to light -breathing  problems -chest pain -dark urine -fast, irregular heartbeat -feeling faint or light-headed -intolerance to heat or cold -nausea or vomiting -pain and swelling of the scrotum -pain, tingling, numbness in feet, hands -redness, blistering, peeling or loosening of the skin, including inside the mouth -spitting up blood -stomach pain -sweating -unusual or uncontrolled movements of body -unusually weak or tired -weight gain or loss -yellowing of the eyes or skin Side effects that usually do not require medical attention (report to your doctor or health care professional if they continue or are bothersome): -change in sex drive or performance -constipation -dizziness -headache -loss of appetite -trouble sleeping This list may not describe all possible side effects. Call your doctor for medical advice about side effects. You may report side effects to FDA at 1-800-FDA-1088. Where should I keep my medicine? Keep out of the reach of children. Store at room temperature between 20 and 25 degrees C (68 and 77 degrees F). Protect from light. Keep container tightly closed. Throw away any unused medicine after the expiration date. NOTE: This sheet is a summary. It may not cover all possible information. If you have questions about this medicine, talk to your doctor, pharmacist, or health care provider.  2018 Elsevier/Gold Standard (2013-10-22 19:48:11)    Electrical Cardioversion Electrical cardioversion is the delivery of a jolt of electricity to restore a normal rhythm to the heart. A rhythm that is too fast or is not regular keeps the heart from pumping well. In this procedure, sticky patches or metal paddles are placed on the chest to deliver electricity to the heart from a device. This procedure may be done in an emergency if:  There is low or no blood pressure as a result of the heart rhythm.  Normal rhythm must be restored as fast as possible to protect the brain and heart from further  damage.  It may save a life.  This procedure may also be done for irregular or fast heart rhythms that are not immediately life-threatening. Tell a health care provider about:  Any allergies you have.  All medicines you are taking, including vitamins, herbs, eye drops, creams, and over-the-counter medicines.  Any problems you or family members have had with anesthetic medicines.  Any blood disorders you have.  Any surgeries you have had.  Any medical conditions you have.  Whether you are pregnant or may be pregnant. What are the risks? Generally, this is a safe procedure. However, problems may occur, including:  Allergic reactions to medicines.  A blood clot that breaks free and travels to other parts of your body.  The possible return of an abnormal heart rhythm within hours or days after the procedure.  Your heart stopping (cardiac arrest). This is rare.  What happens before the procedure? Medicines  Your health care provider may have you start taking: ? Blood-thinning medicines (anticoagulants) so your blood does not clot as easily. ? Medicines may be given to help stabilize your heart rate and rhythm.  Ask your health care provider about changing or stopping your regular medicines. This is especially important if you are taking diabetes medicines or blood thinners. General instructions  Plan to have someone take you home from the hospital or clinic.  If you will be going home right after the procedure, plan to have someone with you for 24 hours.  Follow instructions from your health care provider about eating or drinking restrictions. What happens during the procedure?  To lower your risk of infection: ? Your health care team will wash or sanitize their hands. ? Your skin will be washed with soap.  An IV tube will be inserted into one of your veins.  You will be given a medicine to help you relax (sedative).  Sticky patches (electrodes) or metal paddles  may be placed on your chest.  An electrical shock will be delivered. The procedure may vary among health care providers and hospitals. What happens after the procedure?  Your blood pressure, heart rate, breathing rate, and blood oxygen level will be monitored until the medicines you were given have worn off.  Do not drive for 24 hours if you were given a sedative.  Your heart rhythm will be watched to make sure it does not change. This information is not intended to replace advice given to you by your health care provider. Make sure you discuss any questions you have with your health care provider. Document Released: 07/09/2002 Document Revised: 03/17/2016 Document Reviewed: 01/23/2016 Elsevier Interactive Patient Education  2017 [employer].

## 2017-12-01 DIAGNOSIS — R600 Localized edema: Secondary | ICD-10-CM | POA: Diagnosis not present

## 2017-12-01 DIAGNOSIS — I872 Venous insufficiency (chronic) (peripheral): Secondary | ICD-10-CM | POA: Diagnosis not present

## 2017-12-06 ENCOUNTER — Telehealth: Payer: Self-pay

## 2017-12-06 MED ORDER — AMIODARONE HCL 200 MG PO TABS: ORAL_TABLET | ORAL | 0 refills | Status: DC

## 2017-12-06 NOTE — Telephone Encounter (Signed)
Patient tells me she stopped Celexa Friday, 5/3. Advised patient to start Amiodarone this Saturday, 5/11.  She understands I will call her in the next week/two to arrange DCCV. Patient verbalized understanding and agreeable to plan.

## 2017-12-06 NOTE — Telephone Encounter (Signed)
Recommend citalopram washout of 1 week prior to starting amiodarone due to prolonged half life, particularly in elderly patients.

## 2017-12-06 NOTE — Telephone Encounter (Signed)
Kelly/Megan: Routing to pharmacist for clarification. I will contact pt once advised

## 2017-12-06 NOTE — Telephone Encounter (Signed)
Patient called about starting Amiodarone, she was advised to not start this medication until she stopped Celexa. Patient contacted her PCP on Friday and Celexa was stopped. Patient would like a call back about starting Amiodarone.

## 2017-12-13 ENCOUNTER — Institutional Professional Consult (permissible substitution): Payer: Medicare Other | Admitting: Cardiology

## 2017-12-15 ENCOUNTER — Telehealth: Payer: Self-pay | Admitting: Cardiology

## 2017-12-15 NOTE — Telephone Encounter (Signed)
New message  Pt daughter Gwinda Passe verbalzied that she is calling for RN  She states that she has some questions for rn   Pt is running a fever since last Saturday

## 2017-12-15 NOTE — Telephone Encounter (Signed)
Advised that fever is not related to new medication, Amiodarone. Dtr, Gwinda Passe tells me that they discussed issue w/ PCP who agreed he did not think r/t medication. She appreciates my call.

## 2017-12-15 NOTE — Telephone Encounter (Signed)
DCCV scheduled for 6/3. Instructions reviewed w/ dtr. NPO after MN night before procedure. Hold Lasix morning of procedure. Advised to keep f/u appt on 8/7. Patient verbalized understanding and agreeable to plan.

## 2017-12-19 ENCOUNTER — Encounter (HOSPITAL_COMMUNITY): Admission: RE | Payer: Self-pay | Source: Ambulatory Visit

## 2017-12-19 ENCOUNTER — Ambulatory Visit (HOSPITAL_COMMUNITY): Admission: RE | Admit: 2017-12-19 | Payer: Medicare Other | Source: Ambulatory Visit | Admitting: Cardiology

## 2017-12-19 SURGERY — CARDIOVERSION
Anesthesia: General

## 2018-01-02 ENCOUNTER — Ambulatory Visit (HOSPITAL_COMMUNITY)
Admission: RE | Admit: 2018-01-02 | Discharge: 2018-01-02 | Disposition: A | Discharge status: Home / Self Care | Payer: Medicare Other | Source: Ambulatory Visit | Attending: Cardiology | Admitting: Cardiology

## 2018-01-02 ENCOUNTER — Ambulatory Visit (HOSPITAL_COMMUNITY): Payer: Medicare Other | Admitting: Anesthesiology

## 2018-01-02 ENCOUNTER — Encounter (HOSPITAL_COMMUNITY)
Admission: RE | Disposition: A | Discharge status: Home / Self Care | Payer: Self-pay | Source: Ambulatory Visit | Attending: Cardiology

## 2018-01-02 ENCOUNTER — Encounter (HOSPITAL_COMMUNITY): Payer: Self-pay | Admitting: Anesthesiology

## 2018-01-02 DIAGNOSIS — I6523 Occlusion and stenosis of bilateral carotid arteries: Secondary | ICD-10-CM | POA: Diagnosis not present

## 2018-01-02 DIAGNOSIS — Z885 Allergy status to narcotic agent status: Secondary | ICD-10-CM | POA: Insufficient documentation

## 2018-01-02 DIAGNOSIS — Z888 Allergy status to other drugs, medicaments and biological substances status: Secondary | ICD-10-CM | POA: Diagnosis not present

## 2018-01-02 DIAGNOSIS — I10 Essential (primary) hypertension: Secondary | ICD-10-CM | POA: Diagnosis not present

## 2018-01-02 DIAGNOSIS — I481 Persistent atrial fibrillation: Secondary | ICD-10-CM | POA: Diagnosis not present

## 2018-01-02 DIAGNOSIS — Z90710 Acquired absence of both cervix and uterus: Secondary | ICD-10-CM | POA: Diagnosis not present

## 2018-01-02 DIAGNOSIS — Z880 Allergy status to penicillin: Secondary | ICD-10-CM | POA: Insufficient documentation

## 2018-01-02 DIAGNOSIS — Z79899 Other long term (current) drug therapy: Secondary | ICD-10-CM | POA: Insufficient documentation

## 2018-01-02 DIAGNOSIS — I4891 Unspecified atrial fibrillation: Secondary | ICD-10-CM | POA: Insufficient documentation

## 2018-01-02 DIAGNOSIS — Z8249 Family history of ischemic heart disease and other diseases of the circulatory system: Secondary | ICD-10-CM | POA: Diagnosis not present

## 2018-01-02 DIAGNOSIS — I639 Cerebral infarction, unspecified: Secondary | ICD-10-CM | POA: Diagnosis not present

## 2018-01-02 DIAGNOSIS — E119 Type 2 diabetes mellitus without complications: Secondary | ICD-10-CM | POA: Insufficient documentation

## 2018-01-02 DIAGNOSIS — Z87891 Personal history of nicotine dependence: Secondary | ICD-10-CM | POA: Diagnosis not present

## 2018-01-02 DIAGNOSIS — M810 Age-related osteoporosis without current pathological fracture: Secondary | ICD-10-CM | POA: Diagnosis not present

## 2018-01-02 DIAGNOSIS — E785 Hyperlipidemia, unspecified: Secondary | ICD-10-CM | POA: Diagnosis not present

## 2018-01-02 DIAGNOSIS — I739 Peripheral vascular disease, unspecified: Secondary | ICD-10-CM | POA: Insufficient documentation

## 2018-01-02 DIAGNOSIS — Z7901 Long term (current) use of anticoagulants: Secondary | ICD-10-CM | POA: Diagnosis not present

## 2018-01-02 DIAGNOSIS — Z9889 Other specified postprocedural states: Secondary | ICD-10-CM | POA: Diagnosis not present

## 2018-01-02 DIAGNOSIS — Z86010 Personal history of colonic polyps: Secondary | ICD-10-CM | POA: Diagnosis not present

## 2018-01-02 HISTORY — PX: CARDIOVERSION: SHX1299

## 2018-01-02 LAB — POCT I-STAT 4, (NA,K, GLUC, HGB,HCT)
Glucose, Bld: 125 mg/dL — ABNORMAL HIGH (ref 65–99)
HEMATOCRIT: 36 % (ref 36.0–46.0)
HEMOGLOBIN: 12.2 g/dL (ref 12.0–15.0)
Potassium: 4.4 mmol/L (ref 3.5–5.1)
SODIUM: 139 mmol/L (ref 135–145)

## 2018-01-02 SURGERY — CARDIOVERSION
Anesthesia: General

## 2018-01-02 MED ORDER — SODIUM CHLORIDE 0.9 % IV SOLN
INTRAVENOUS | Status: DC | PRN
  Administered 2018-01-02: 09:00:00 via INTRAVENOUS

## 2018-01-02 MED ORDER — PROPOFOL 10 MG/ML IV BOLUS
INTRAVENOUS | Status: DC | PRN
  Administered 2018-01-02: 80 mg via INTRAVENOUS

## 2018-01-02 MED ORDER — LIDOCAINE HCL (CARDIAC) PF 100 MG/5ML IV SOSY
PREFILLED_SYRINGE | INTRAVENOUS | Status: DC | PRN
  Administered 2018-01-02: 100 mg via INTRAVENOUS

## 2018-01-02 NOTE — Anesthesia Preprocedure Evaluation (Addendum)
Anesthesia Evaluation  Patient identified by MRN, date of birth, ID band Patient awake    Reviewed: Allergy & Precautions, NPO status , Patient's Chart, lab work & pertinent test results  Airway Mallampati: II  TM Distance: >3 FB Neck ROM: Full    Dental  (+) Upper Dentures, Partial Lower, Dental Advisory Given   Pulmonary former smoker,    Pulmonary exam normal breath sounds clear to auscultation       Cardiovascular hypertension, Pt. on medications Normal cardiovascular exam+ dysrhythmias Atrial Fibrillation      Neuro/Psych Depression    GI/Hepatic Neg liver ROS, GERD  Medicated and Controlled,  Endo/Other  diabetes  Renal/GU      Musculoskeletal negative musculoskeletal ROS (+)   Abdominal   Peds  Hematology negative hematology ROS (+)   Anesthesia Other Findings   Reproductive/Obstetrics negative OB ROS                            Anesthesia Physical Anesthesia Plan  ASA: III  Anesthesia Plan: General   Post-op Pain Management:    Induction: Intravenous  PONV Risk Score and Plan: 3 and Treatment may vary due to age or medical condition  Airway Management Planned: Mask  Additional Equipment:   Intra-op Plan:   Post-operative Plan:   Informed Consent: I have reviewed the patients History and Physical, chart, labs and discussed the procedure including the risks, benefits and alternatives for the proposed anesthesia with the patient or authorized representative who has indicated his/her understanding and acceptance.     Plan Discussed with: CRNA and Surgeon  Anesthesia Plan Comments:         Anesthesia Quick Evaluation

## 2018-01-02 NOTE — Anesthesia Procedure Notes (Signed)
Procedure Name: General with mask airway Date/Time: 01/02/2018 9:31 AM Performed by: [name], CRNA Pre-anesthesia Checklist: Patient identified, Emergency Drugs available, Suction available and Patient being monitored Patient Re-evaluated:Patient Re-evaluated prior to induction

## 2018-01-02 NOTE — Procedures (Signed)
Electrical Cardioversion Procedure Note MORELIA CASSELLS [id number] [birth year]  Procedure: Electrical Cardioversion Indications:  Atrial Fibrillation  Procedure Details Consent: Risks of procedure as well as the alternatives and risks of each were explained to the (patient/caregiver).  Consent for procedure obtained. Time Out: Verified patient identification, verified procedure, site/side was marked, verified correct patient position, special equipment/implants available, medications/allergies/relevent history reviewed, required imaging and test results available.  Performed  Patient placed on cardiac monitor, pulse oximetry, supplemental oxygen as necessary.  Sedation given: Propofol per anesthesiology Pacer pads placed anterior and posterior chest.  Cardioverted 1 time(s).  Cardioverted at [place].  Evaluation Findings: Post procedure EKG shows: NSR Complications: None Patient did tolerate procedure well.   Loralie Champagne 01/02/2018, 9:35 AM

## 2018-01-02 NOTE — Discharge Instructions (Signed)
Electrical Cardioversion, Care After °This sheet gives you information about how to care for yourself after your procedure. Your health care provider may also give you more specific instructions. If you have problems or questions, contact your health care provider. °What can I expect after the procedure? °After the procedure, it is common to have: °· Some redness on the skin where the shocks were given. ° °Follow these instructions at home: °· Do not drive for 24 hours if you were given a medicine to help you relax (sedative). °· Take over-the-counter and prescription medicines only as told by your health care provider. °· Ask your health care provider how to check your pulse. Check it often. °· Rest for 48 hours after the procedure or as told by your health care provider. °· Avoid or limit your caffeine use as told by your health care provider. °Contact a health care provider if: °· You feel like your heart is beating too quickly or your pulse is not regular. °· You have a serious muscle cramp that does not go away. °Get help right away if: °· You have discomfort in your chest. °· You are dizzy or you feel faint. °· You have trouble breathing or you are short of breath. °· Your speech is slurred. °· You have trouble moving an arm or leg on one side of your body. °· Your fingers or toes turn cold or blue. °This information is not intended to replace advice given to you by your health care provider. Make sure you discuss any questions you have with your health care provider. °Document Released: 05/09/2013 Document Revised: 02/20/2016 Document Reviewed: 01/23/2016 °Elsevier Interactive Patient Education © 2018 [employer] ° °

## 2018-01-02 NOTE — Transfer of Care (Signed)
Immediate Anesthesia Transfer of Care Note  Patient: Brooke Mullins  Procedure(s) Performed: CARDIOVERSION (N/A )  Patient Location: Endoscopy Unit  Anesthesia Type:General  Level of Consciousness: oriented, drowsy and patient cooperative  Airway & Oxygen Therapy: Patient Spontanous Breathing and Patient connected to nasal cannula oxygen  Post-op Assessment: Report given to RN and Post -op Vital signs reviewed and stable  Post vital signs: Reviewed  Last Vitals:  Vitals Value Taken Time  BP 119/46 01/02/2018  9:42 AM  Temp 36.6 C 01/02/2018  9:42 AM  Pulse 43 01/02/2018  9:42 AM  Resp 18 01/02/2018  9:42 AM  SpO2 100 % 01/02/2018  9:42 AM    Last Pain:  Vitals:   01/02/18 0942  TempSrc: Axillary  PainSc: 0-No pain         Complications: No apparent anesthesia complications

## 2018-01-02 NOTE — H&P (Signed)
Primary Physician: [name], MD PCP-Cardiologist:  No primary care provider on file.  HPI:    Patient presents for DCCV.  No specific complaints today.  She is in afib.   Review of Systems: All systems reviewed and negative except as per HPI.  Home Medications Prior to Admission medications   Medication Sig Start Date End Date Taking? Authorizing Provider  acetaminophen (TYLENOL) 500 MG tablet Take 500 mg by mouth every 6 (six) hours as needed for moderate pain or headache.    Yes [provider]  amiodarone (PACERONE) 200 MG tablet Take 1 tablet (200 mg total) by mouth daily. Patient taking differently: Take 200 mg by mouth 2 (two) times daily.  11/30/17  Yes Camnitz, Will Hassell Done, MD  ELIQUIS 5 MG TABS tablet Take 5 mg by mouth 2 (two) times daily. 09/06/17  Yes [provider]  escitalopram (LEXAPRO) 20 MG tablet Take 20 mg by mouth at bedtime.   Yes [provider]  furosemide (LASIX) 20 MG tablet Take 20 mg by mouth daily as needed for fluid.  08/23/15  Yes [provider]  omeprazole (PRILOSEC) 20 MG capsule Take 20 mg by mouth at bedtime.  09/09/17  Yes [provider]  quinapril (ACCUPRIL) 40 MG tablet TAKE ONE TABLET EVERY DAY AT BEDTIME 09/17/15  Yes [provider]  albuterol (PROAIR HFA) 108 (90 Base) MCG/ACT inhaler Inhale 1-2 puffs into the lungs every 6 (six) hours as needed for wheezing or shortness of breath.  08/23/15   [provider]  metoprolol tartrate (LOPRESSOR) 25 MG tablet Take 1 tablet (25 mg total) by mouth 2 (two) times daily. 09/19/17 12/18/17  Park Liter, MD    Past Medical History: Past Medical History:  Diagnosis Date  . Atrial fibrillation ([place])   . Carotid artery stenosis with cerebral infarction ([place])   . Carotid atherosclerosis, bilateral   . Depression   . Diabetes mellitus ([place])   . Dyslipidemia   . Edema   . Fracture of femoral neck, right ([place])   . Hyperplastic  colon polyp   . Hypertension   . Osteoporosis   . Peripheral vascular disease of extremity ([place])   . PSVT (paroxysmal supraventricular tachycardia) ([place])   . Subclavian artery stenosis [facility])     Past Surgical History: Past Surgical History:  Procedure Laterality Date  . ABDOMINAL HYSTERECTOMY    . CARDIOVERSION N/A 10/17/2017   Procedure: CARDIOVERSION;  Surgeon: Sanda Klein, MD;  Location: [facility];  Service: Cardiovascular;  Laterality: N/A;    Family History: Family History  Problem Relation Age of Onset  . Heart failure Mother   . COPD Father   . Hypertension Brother   . Cancer Brother     Social History: Social History   Socioeconomic History  . Marital status: Married    Spouse name: Not on file  . Number of children: Not on file  . Years of education: Not on file  . Highest education level: Not on file  Occupational History  . Not on file  Social Needs  . Financial resource strain: Not on file  . Food insecurity:    Worry: Not on file    Inability: Not on file  . Transportation needs:    Medical: Not on file    Non-medical: Not on file  Tobacco Use  . Smoking status: Former Research scientist (life sciences)  . Smokeless tobacco: Never Used  Substance and Sexual Activity  . Alcohol use:  No    Frequency: Never  . Drug use: No  . Sexual activity: Not on file  Lifestyle  . Physical activity:    Days per week: Not on file    Minutes per session: Not on file  . Stress: Not on file  Relationships  . Social connections:    Talks on phone: Not on file    Gets together: Not on file    Attends religious service: Not on file    Active member of club or organization: Not on file    Attends meetings of clubs or organizations: Not on file    Relationship status: Not on file  Other Topics Concern  . Not on file  Social History Narrative  . Not on file    Allergies:  Allergies  Allergen Reactions  . Penicillins Anaphylaxis and Other (See Comments)    Has patient had a PCN  reaction causing immediate rash, facial/tongue/throat swelling, SOB or lightheadedness with hypotension: Yes Has patient had a PCN reaction causing severe rash involving mucus membranes or skin necrosis: No Has patient had a PCN reaction that required hospitalization: No Has patient had a PCN reaction occurring within the last 10 years: No If all of the above answers are "NO", then may proceed with Cephalosporin use.   . Atorvastatin Other (See Comments)    Muscle pain  . Codeine Other (See Comments)    Nausea/Vomiting  . Colesevelam Other (See Comments)    Unsure of exact reaction type  . Ezetimibe Other (See Comments)    Pain in legs    Objective:    Vital Signs:   Temp:  [98.2 F (36.8 C)] 98.2 F (36.8 C) (06/03 0913) Pulse Rate:  [69] 69 (06/03 0913) Resp:  [10] 10 (06/03 0913) BP: (169)/(68) 169/68 (06/03 0913) SpO2:  [96 %] 96 % (06/03 0913) Weight:  [154 lb (69.9 kg)] 154 lb (69.9 kg) (06/03 0913)    Weight change: Filed Weights   01/02/18 0913  Weight: 154 lb (69.9 kg)    Intake/Output:  No intake or output data in the 24 hours ending 01/02/18 0931    Physical Exam    General:  Well appearing. No resp difficulty HEENT: normal Neck: supple. JVP not elevated. Carotids 2+ bilat; no bruits. No lymphadenopathy or thyromegaly appreciated. Cor: PMI nondisplaced. Irregular rate & rhythm. No rubs, gallops or murmurs. Lungs: clear Abdomen: soft, nontender, nondistended. No hepatosplenomegaly. No bruits or masses. Good bowel sounds. Extremities: no cyanosis, clubbing, rash, edema Neuro: alert & orientedx3, cranial nerves grossly intact. moves all 4 extremities w/o difficulty. Affect pleasant   Telemetry   Atrial fibrillation in 70s   Labs   Basic Metabolic Panel: No results for input(s): NA, K, CL, CO2, GLUCOSE, BUN, CREATININE, CALCIUM, MG, PHOS in the last 168 hours.  Liver Function Tests: No results for input(s): AST, ALT, ALKPHOS, BILITOT, PROT, ALBUMIN  in the last 168 hours. No results for input(s): LIPASE, AMYLASE in the last 168 hours. No results for input(s): AMMONIA in the last 168 hours.  CBC: No results for input(s): WBC, NEUTROABS, HGB, HCT, MCV, PLT in the last 168 hours.  Cardiac Enzymes: No results for input(s): CKTOTAL, CKMB, CKMBINDEX, TROPONINI in the last 168 hours.  BNP: BNP (last 3 results) No results for input(s): BNP in the last 8760 hours.  ProBNP (last 3 results) No results for input(s): PROBNP in the last 8760 hours.   CBG: No results for input(s): GLUCAP in the last 168 hours.  Coagulation Studies: No results for input(s): LABPROT, INR in the last 72 hours.   Imaging    No results found.   Medications:     Current Medications:    Infusions:      Assessment/Plan   Patient presents for DCCV.  She has not missed Eliquis. Will proceed with DCCV.   Length of Stay: 0  Loralie Champagne, MD  01/02/2018, 9:31 AM  Advanced Heart Failure Team Pager [phone] (M-F; 7a - 4p)  Please contact [facility] for night-coverage after hours (4p -7a ) and weekends on [email or web address]

## 2018-01-02 NOTE — Anesthesia Postprocedure Evaluation (Signed)
Anesthesia Post Note  Patient: Brooke Mullins  Procedure(s) Performed: CARDIOVERSION (N/A )     Patient location during evaluation: PACU Anesthesia Type: General Level of consciousness: awake and alert Pain management: pain level controlled Vital Signs Assessment: post-procedure vital signs reviewed and stable Respiratory status: spontaneous breathing, nonlabored ventilation, respiratory function stable and patient connected to nasal cannula oxygen Cardiovascular status: blood pressure returned to baseline and stable Postop Assessment: no apparent nausea or vomiting Anesthetic complications: no    Last Vitals:  Vitals:   01/02/18 0952 01/02/18 1002  BP: (!) 127/46 (!) 136/59  Pulse: (!) 45 (!) 49  Resp: 16 14  Temp:    SpO2: 95% 97%    Last Pain:  Vitals:   01/02/18 1002  TempSrc:   PainSc: 0-No pain                 Takeem Krotzer DAVID

## 2018-01-03 ENCOUNTER — Encounter (HOSPITAL_COMMUNITY): Payer: Self-pay | Admitting: Cardiology

## 2018-01-06 DIAGNOSIS — R0602 Shortness of breath: Secondary | ICD-10-CM | POA: Diagnosis not present

## 2018-01-06 DIAGNOSIS — Z7902 Long term (current) use of antithrombotics/antiplatelets: Secondary | ICD-10-CM | POA: Diagnosis not present

## 2018-01-06 DIAGNOSIS — T148XXA Other injury of unspecified body region, initial encounter: Secondary | ICD-10-CM | POA: Diagnosis not present

## 2018-01-06 DIAGNOSIS — M545 Low back pain: Secondary | ICD-10-CM | POA: Diagnosis not present

## 2018-01-06 DIAGNOSIS — H05231 Hemorrhage of right orbit: Secondary | ICD-10-CM | POA: Diagnosis not present

## 2018-01-06 DIAGNOSIS — T79A9XA Traumatic compartment syndrome of other sites, initial encounter: Secondary | ICD-10-CM | POA: Diagnosis not present

## 2018-01-06 DIAGNOSIS — S0280XA Fracture of other specified skull and facial bones, unspecified side, initial encounter for closed fracture: Secondary | ICD-10-CM | POA: Diagnosis not present

## 2018-01-06 DIAGNOSIS — S41111A Laceration without foreign body of right upper arm, initial encounter: Secondary | ICD-10-CM | POA: Diagnosis not present

## 2018-01-06 DIAGNOSIS — Z87891 Personal history of nicotine dependence: Secondary | ICD-10-CM | POA: Diagnosis not present

## 2018-01-06 DIAGNOSIS — I4891 Unspecified atrial fibrillation: Secondary | ICD-10-CM | POA: Diagnosis not present

## 2018-01-06 DIAGNOSIS — R220 Localized swelling, mass and lump, head: Secondary | ICD-10-CM | POA: Diagnosis not present

## 2018-01-06 DIAGNOSIS — H547 Unspecified visual loss: Secondary | ICD-10-CM | POA: Diagnosis not present

## 2018-01-06 DIAGNOSIS — W19XXXA Unspecified fall, initial encounter: Secondary | ICD-10-CM | POA: Diagnosis not present

## 2018-01-06 DIAGNOSIS — R402142 Coma scale, eyes open, spontaneous, at arrival to emergency department: Secondary | ICD-10-CM | POA: Diagnosis not present

## 2018-01-06 DIAGNOSIS — S0240CA Maxillary fracture, right side, initial encounter for closed fracture: Secondary | ICD-10-CM | POA: Diagnosis not present

## 2018-01-06 DIAGNOSIS — Z8673 Personal history of transient ischemic attack (TIA), and cerebral infarction without residual deficits: Secondary | ICD-10-CM | POA: Diagnosis not present

## 2018-01-06 DIAGNOSIS — Z961 Presence of intraocular lens: Secondary | ICD-10-CM | POA: Diagnosis not present

## 2018-01-06 DIAGNOSIS — H059 Unspecified disorder of orbit: Secondary | ICD-10-CM | POA: Diagnosis not present

## 2018-01-06 DIAGNOSIS — S199XXA Unspecified injury of neck, initial encounter: Secondary | ICD-10-CM | POA: Diagnosis not present

## 2018-01-06 DIAGNOSIS — S0240EA Zygomatic fracture, right side, initial encounter for closed fracture: Secondary | ICD-10-CM | POA: Diagnosis not present

## 2018-01-06 DIAGNOSIS — H52511 Internal ophthalmoplegia (complete) (total), right eye: Secondary | ICD-10-CM | POA: Diagnosis not present

## 2018-01-06 DIAGNOSIS — H40051 Ocular hypertension, right eye: Secondary | ICD-10-CM | POA: Diagnosis not present

## 2018-01-06 DIAGNOSIS — S0993XA Unspecified injury of face, initial encounter: Secondary | ICD-10-CM | POA: Diagnosis not present

## 2018-01-06 DIAGNOSIS — T07XXXA Unspecified multiple injuries, initial encounter: Secondary | ICD-10-CM | POA: Diagnosis not present

## 2018-01-06 DIAGNOSIS — R402362 Coma scale, best motor response, obeys commands, at arrival to emergency department: Secondary | ICD-10-CM | POA: Diagnosis not present

## 2018-01-06 DIAGNOSIS — J90 Pleural effusion, not elsewhere classified: Secondary | ICD-10-CM | POA: Diagnosis not present

## 2018-01-06 DIAGNOSIS — S01111A Laceration without foreign body of right eyelid and periocular area, initial encounter: Secondary | ICD-10-CM | POA: Diagnosis not present

## 2018-01-06 DIAGNOSIS — R609 Edema, unspecified: Secondary | ICD-10-CM | POA: Diagnosis not present

## 2018-01-06 DIAGNOSIS — S04011A Injury of optic nerve, right eye, initial encounter: Secondary | ICD-10-CM | POA: Diagnosis not present

## 2018-01-06 DIAGNOSIS — E785 Hyperlipidemia, unspecified: Secondary | ICD-10-CM | POA: Diagnosis not present

## 2018-01-06 DIAGNOSIS — S0219XA Other fracture of base of skull, initial encounter for closed fracture: Secondary | ICD-10-CM | POA: Diagnosis not present

## 2018-01-06 DIAGNOSIS — S0511XA Contusion of eyeball and orbital tissues, right eye, initial encounter: Secondary | ICD-10-CM | POA: Diagnosis not present

## 2018-01-06 DIAGNOSIS — E1165 Type 2 diabetes mellitus with hyperglycemia: Secondary | ICD-10-CM | POA: Diagnosis not present

## 2018-01-06 DIAGNOSIS — R52 Pain, unspecified: Secondary | ICD-10-CM | POA: Diagnosis not present

## 2018-01-06 DIAGNOSIS — K219 Gastro-esophageal reflux disease without esophagitis: Secondary | ICD-10-CM | POA: Diagnosis not present

## 2018-01-06 DIAGNOSIS — S0231XA Fracture of orbital floor, right side, initial encounter for closed fracture: Secondary | ICD-10-CM | POA: Diagnosis not present

## 2018-01-06 DIAGNOSIS — M542 Cervicalgia: Secondary | ICD-10-CM | POA: Diagnosis not present

## 2018-01-06 DIAGNOSIS — Z880 Allergy status to penicillin: Secondary | ICD-10-CM | POA: Diagnosis not present

## 2018-01-06 DIAGNOSIS — I10 Essential (primary) hypertension: Secondary | ICD-10-CM | POA: Diagnosis not present

## 2018-01-06 DIAGNOSIS — Z888 Allergy status to other drugs, medicaments and biological substances status: Secondary | ICD-10-CM | POA: Diagnosis not present

## 2018-01-06 DIAGNOSIS — S090XXA Injury of blood vessels of head, not elsewhere classified, initial encounter: Secondary | ICD-10-CM | POA: Diagnosis not present

## 2018-01-06 DIAGNOSIS — Z7901 Long term (current) use of anticoagulants: Secondary | ICD-10-CM | POA: Diagnosis not present

## 2018-01-06 DIAGNOSIS — R918 Other nonspecific abnormal finding of lung field: Secondary | ICD-10-CM | POA: Diagnosis not present

## 2018-01-06 DIAGNOSIS — S0281XA Fracture of other specified skull and facial bones, right side, initial encounter for closed fracture: Secondary | ICD-10-CM | POA: Diagnosis not present

## 2018-01-06 DIAGNOSIS — I728 Aneurysm of other specified arteries: Secondary | ICD-10-CM | POA: Diagnosis not present

## 2018-01-06 DIAGNOSIS — R402252 Coma scale, best verbal response, oriented, at arrival to emergency department: Secondary | ICD-10-CM | POA: Diagnosis not present

## 2018-01-06 DIAGNOSIS — Z885 Allergy status to narcotic agent status: Secondary | ICD-10-CM | POA: Diagnosis not present

## 2018-01-06 DIAGNOSIS — S0990XA Unspecified injury of head, initial encounter: Secondary | ICD-10-CM | POA: Diagnosis not present

## 2018-01-06 DIAGNOSIS — S069X0A Unspecified intracranial injury without loss of consciousness, initial encounter: Secondary | ICD-10-CM | POA: Diagnosis not present

## 2018-01-07 HISTORY — PX: ORIF ORBITAL FRACTURE: SHX5312

## 2018-01-11 DIAGNOSIS — R296 Repeated falls: Secondary | ICD-10-CM | POA: Diagnosis not present

## 2018-01-11 DIAGNOSIS — I10 Essential (primary) hypertension: Secondary | ICD-10-CM | POA: Diagnosis not present

## 2018-01-11 DIAGNOSIS — Z7982 Long term (current) use of aspirin: Secondary | ICD-10-CM | POA: Diagnosis not present

## 2018-01-11 DIAGNOSIS — Z7901 Long term (current) use of anticoagulants: Secondary | ICD-10-CM | POA: Diagnosis not present

## 2018-01-11 DIAGNOSIS — S51801D Unspecified open wound of right forearm, subsequent encounter: Secondary | ICD-10-CM | POA: Diagnosis not present

## 2018-01-11 DIAGNOSIS — I4891 Unspecified atrial fibrillation: Secondary | ICD-10-CM | POA: Diagnosis not present

## 2018-01-11 DIAGNOSIS — H52511 Internal ophthalmoplegia (complete) (total), right eye: Secondary | ICD-10-CM | POA: Diagnosis not present

## 2018-01-11 DIAGNOSIS — S81801D Unspecified open wound, right lower leg, subsequent encounter: Secondary | ICD-10-CM | POA: Diagnosis not present

## 2018-01-11 DIAGNOSIS — S0231XD Fracture of orbital floor, right side, subsequent encounter for fracture with routine healing: Secondary | ICD-10-CM | POA: Diagnosis not present

## 2018-01-11 DIAGNOSIS — Z9181 History of falling: Secondary | ICD-10-CM | POA: Diagnosis not present

## 2018-01-12 DIAGNOSIS — H468 Other optic neuritis: Secondary | ICD-10-CM | POA: Diagnosis not present

## 2018-01-12 DIAGNOSIS — I728 Aneurysm of other specified arteries: Secondary | ICD-10-CM | POA: Diagnosis not present

## 2018-01-12 DIAGNOSIS — S0280XS Fracture of other specified skull and facial bones, unspecified side, sequela: Secondary | ICD-10-CM | POA: Diagnosis not present

## 2018-01-12 DIAGNOSIS — S0511XS Contusion of eyeball and orbital tissues, right eye, sequela: Secondary | ICD-10-CM | POA: Diagnosis not present

## 2018-01-13 DIAGNOSIS — S0231XD Fracture of orbital floor, right side, subsequent encounter for fracture with routine healing: Secondary | ICD-10-CM | POA: Diagnosis not present

## 2018-01-13 DIAGNOSIS — I4891 Unspecified atrial fibrillation: Secondary | ICD-10-CM | POA: Diagnosis not present

## 2018-01-13 DIAGNOSIS — S81801D Unspecified open wound, right lower leg, subsequent encounter: Secondary | ICD-10-CM | POA: Diagnosis not present

## 2018-01-13 DIAGNOSIS — S51801D Unspecified open wound of right forearm, subsequent encounter: Secondary | ICD-10-CM | POA: Diagnosis not present

## 2018-01-13 DIAGNOSIS — H52511 Internal ophthalmoplegia (complete) (total), right eye: Secondary | ICD-10-CM | POA: Diagnosis not present

## 2018-01-13 DIAGNOSIS — I10 Essential (primary) hypertension: Secondary | ICD-10-CM | POA: Diagnosis not present

## 2018-01-13 DIAGNOSIS — Z9181 History of falling: Secondary | ICD-10-CM | POA: Diagnosis not present

## 2018-01-13 DIAGNOSIS — Z7982 Long term (current) use of aspirin: Secondary | ICD-10-CM | POA: Diagnosis not present

## 2018-01-13 DIAGNOSIS — R296 Repeated falls: Secondary | ICD-10-CM | POA: Diagnosis not present

## 2018-01-13 DIAGNOSIS — Z7901 Long term (current) use of anticoagulants: Secondary | ICD-10-CM | POA: Diagnosis not present

## 2018-01-16 DIAGNOSIS — S81801D Unspecified open wound, right lower leg, subsequent encounter: Secondary | ICD-10-CM | POA: Diagnosis not present

## 2018-01-16 DIAGNOSIS — I10 Essential (primary) hypertension: Secondary | ICD-10-CM | POA: Diagnosis not present

## 2018-01-16 DIAGNOSIS — S51801D Unspecified open wound of right forearm, subsequent encounter: Secondary | ICD-10-CM | POA: Diagnosis not present

## 2018-01-16 DIAGNOSIS — Z7901 Long term (current) use of anticoagulants: Secondary | ICD-10-CM | POA: Diagnosis not present

## 2018-01-16 DIAGNOSIS — Z9181 History of falling: Secondary | ICD-10-CM | POA: Diagnosis not present

## 2018-01-16 DIAGNOSIS — R296 Repeated falls: Secondary | ICD-10-CM | POA: Diagnosis not present

## 2018-01-16 DIAGNOSIS — H52511 Internal ophthalmoplegia (complete) (total), right eye: Secondary | ICD-10-CM | POA: Diagnosis not present

## 2018-01-16 DIAGNOSIS — S0231XD Fracture of orbital floor, right side, subsequent encounter for fracture with routine healing: Secondary | ICD-10-CM | POA: Diagnosis not present

## 2018-01-16 DIAGNOSIS — I4891 Unspecified atrial fibrillation: Secondary | ICD-10-CM | POA: Diagnosis not present

## 2018-01-16 DIAGNOSIS — Z7982 Long term (current) use of aspirin: Secondary | ICD-10-CM | POA: Diagnosis not present

## 2018-01-17 DIAGNOSIS — S0240EA Zygomatic fracture, right side, initial encounter for closed fracture: Secondary | ICD-10-CM | POA: Diagnosis not present

## 2018-01-17 DIAGNOSIS — E119 Type 2 diabetes mellitus without complications: Secondary | ICD-10-CM | POA: Diagnosis not present

## 2018-01-17 DIAGNOSIS — I6501 Occlusion and stenosis of right vertebral artery: Secondary | ICD-10-CM | POA: Diagnosis not present

## 2018-01-17 DIAGNOSIS — S02401A Maxillary fracture, unspecified, initial encounter for closed fracture: Secondary | ICD-10-CM | POA: Diagnosis not present

## 2018-01-17 DIAGNOSIS — S0280XA Fracture of other specified skull and facial bones, unspecified side, initial encounter for closed fracture: Secondary | ICD-10-CM | POA: Diagnosis not present

## 2018-01-17 DIAGNOSIS — Z79899 Other long term (current) drug therapy: Secondary | ICD-10-CM | POA: Diagnosis not present

## 2018-01-18 DIAGNOSIS — R040 Epistaxis: Secondary | ICD-10-CM | POA: Diagnosis not present

## 2018-01-18 DIAGNOSIS — I10 Essential (primary) hypertension: Secondary | ICD-10-CM | POA: Diagnosis not present

## 2018-01-18 DIAGNOSIS — I4891 Unspecified atrial fibrillation: Secondary | ICD-10-CM | POA: Diagnosis not present

## 2018-01-18 DIAGNOSIS — Z7901 Long term (current) use of anticoagulants: Secondary | ICD-10-CM | POA: Diagnosis not present

## 2018-01-18 DIAGNOSIS — I447 Left bundle-branch block, unspecified: Secondary | ICD-10-CM | POA: Diagnosis not present

## 2018-01-19 DIAGNOSIS — H52511 Internal ophthalmoplegia (complete) (total), right eye: Secondary | ICD-10-CM | POA: Diagnosis not present

## 2018-01-19 DIAGNOSIS — S51801D Unspecified open wound of right forearm, subsequent encounter: Secondary | ICD-10-CM | POA: Diagnosis not present

## 2018-01-19 DIAGNOSIS — I10 Essential (primary) hypertension: Secondary | ICD-10-CM | POA: Diagnosis not present

## 2018-01-19 DIAGNOSIS — R296 Repeated falls: Secondary | ICD-10-CM | POA: Diagnosis not present

## 2018-01-19 DIAGNOSIS — Z7901 Long term (current) use of anticoagulants: Secondary | ICD-10-CM | POA: Diagnosis not present

## 2018-01-19 DIAGNOSIS — S0231XD Fracture of orbital floor, right side, subsequent encounter for fracture with routine healing: Secondary | ICD-10-CM | POA: Diagnosis not present

## 2018-01-19 DIAGNOSIS — S81801D Unspecified open wound, right lower leg, subsequent encounter: Secondary | ICD-10-CM | POA: Diagnosis not present

## 2018-01-19 DIAGNOSIS — Z7982 Long term (current) use of aspirin: Secondary | ICD-10-CM | POA: Diagnosis not present

## 2018-01-19 DIAGNOSIS — Z9181 History of falling: Secondary | ICD-10-CM | POA: Diagnosis not present

## 2018-01-19 DIAGNOSIS — I4891 Unspecified atrial fibrillation: Secondary | ICD-10-CM | POA: Diagnosis not present

## 2018-01-23 DIAGNOSIS — I447 Left bundle-branch block, unspecified: Secondary | ICD-10-CM | POA: Diagnosis not present

## 2018-01-23 DIAGNOSIS — I499 Cardiac arrhythmia, unspecified: Secondary | ICD-10-CM | POA: Diagnosis not present

## 2018-01-23 DIAGNOSIS — K219 Gastro-esophageal reflux disease without esophagitis: Secondary | ICD-10-CM | POA: Diagnosis not present

## 2018-01-23 DIAGNOSIS — I4891 Unspecified atrial fibrillation: Secondary | ICD-10-CM | POA: Diagnosis not present

## 2018-01-23 DIAGNOSIS — I10 Essential (primary) hypertension: Secondary | ICD-10-CM | POA: Diagnosis not present

## 2018-01-23 DIAGNOSIS — Z87891 Personal history of nicotine dependence: Secondary | ICD-10-CM | POA: Diagnosis not present

## 2018-01-23 DIAGNOSIS — G4489 Other headache syndrome: Secondary | ICD-10-CM | POA: Diagnosis not present

## 2018-01-23 DIAGNOSIS — R040 Epistaxis: Secondary | ICD-10-CM | POA: Diagnosis not present

## 2018-01-25 DIAGNOSIS — Z9181 History of falling: Secondary | ICD-10-CM | POA: Diagnosis not present

## 2018-01-25 DIAGNOSIS — Z7982 Long term (current) use of aspirin: Secondary | ICD-10-CM | POA: Diagnosis not present

## 2018-01-25 DIAGNOSIS — S81801D Unspecified open wound, right lower leg, subsequent encounter: Secondary | ICD-10-CM | POA: Diagnosis not present

## 2018-01-25 DIAGNOSIS — I10 Essential (primary) hypertension: Secondary | ICD-10-CM | POA: Diagnosis not present

## 2018-01-25 DIAGNOSIS — R296 Repeated falls: Secondary | ICD-10-CM | POA: Diagnosis not present

## 2018-01-25 DIAGNOSIS — S0231XD Fracture of orbital floor, right side, subsequent encounter for fracture with routine healing: Secondary | ICD-10-CM | POA: Diagnosis not present

## 2018-01-25 DIAGNOSIS — Z7901 Long term (current) use of anticoagulants: Secondary | ICD-10-CM | POA: Diagnosis not present

## 2018-01-25 DIAGNOSIS — H52511 Internal ophthalmoplegia (complete) (total), right eye: Secondary | ICD-10-CM | POA: Diagnosis not present

## 2018-01-25 DIAGNOSIS — I4891 Unspecified atrial fibrillation: Secondary | ICD-10-CM | POA: Diagnosis not present

## 2018-01-25 DIAGNOSIS — S51801D Unspecified open wound of right forearm, subsequent encounter: Secondary | ICD-10-CM | POA: Diagnosis not present

## 2018-01-27 DIAGNOSIS — H02102 Unspecified ectropion of right lower eyelid: Secondary | ICD-10-CM | POA: Diagnosis not present

## 2018-01-27 DIAGNOSIS — S0280XA Fracture of other specified skull and facial bones, unspecified side, initial encounter for closed fracture: Secondary | ICD-10-CM | POA: Diagnosis not present

## 2018-01-27 DIAGNOSIS — H11421 Conjunctival edema, right eye: Secondary | ICD-10-CM | POA: Diagnosis not present

## 2018-01-27 DIAGNOSIS — Z79899 Other long term (current) drug therapy: Secondary | ICD-10-CM | POA: Diagnosis not present

## 2018-01-27 DIAGNOSIS — H02112 Cicatricial ectropion of right lower eyelid: Secondary | ICD-10-CM | POA: Diagnosis not present

## 2018-01-27 DIAGNOSIS — Z9889 Other specified postprocedural states: Secondary | ICD-10-CM | POA: Diagnosis not present

## 2018-01-30 DIAGNOSIS — S51801D Unspecified open wound of right forearm, subsequent encounter: Secondary | ICD-10-CM | POA: Diagnosis not present

## 2018-01-30 DIAGNOSIS — I10 Essential (primary) hypertension: Secondary | ICD-10-CM | POA: Diagnosis not present

## 2018-01-30 DIAGNOSIS — Z7982 Long term (current) use of aspirin: Secondary | ICD-10-CM | POA: Diagnosis not present

## 2018-01-30 DIAGNOSIS — R296 Repeated falls: Secondary | ICD-10-CM | POA: Diagnosis not present

## 2018-01-30 DIAGNOSIS — I4891 Unspecified atrial fibrillation: Secondary | ICD-10-CM | POA: Diagnosis not present

## 2018-01-30 DIAGNOSIS — Z7901 Long term (current) use of anticoagulants: Secondary | ICD-10-CM | POA: Diagnosis not present

## 2018-01-30 DIAGNOSIS — H52511 Internal ophthalmoplegia (complete) (total), right eye: Secondary | ICD-10-CM | POA: Diagnosis not present

## 2018-01-30 DIAGNOSIS — S81801D Unspecified open wound, right lower leg, subsequent encounter: Secondary | ICD-10-CM | POA: Diagnosis not present

## 2018-01-30 DIAGNOSIS — Z9181 History of falling: Secondary | ICD-10-CM | POA: Diagnosis not present

## 2018-01-30 DIAGNOSIS — S0231XD Fracture of orbital floor, right side, subsequent encounter for fracture with routine healing: Secondary | ICD-10-CM | POA: Diagnosis not present

## 2018-02-02 DIAGNOSIS — S51801D Unspecified open wound of right forearm, subsequent encounter: Secondary | ICD-10-CM | POA: Diagnosis not present

## 2018-02-02 DIAGNOSIS — I4891 Unspecified atrial fibrillation: Secondary | ICD-10-CM | POA: Diagnosis not present

## 2018-02-02 DIAGNOSIS — S0231XD Fracture of orbital floor, right side, subsequent encounter for fracture with routine healing: Secondary | ICD-10-CM | POA: Diagnosis not present

## 2018-02-02 DIAGNOSIS — Z9181 History of falling: Secondary | ICD-10-CM | POA: Diagnosis not present

## 2018-02-02 DIAGNOSIS — I10 Essential (primary) hypertension: Secondary | ICD-10-CM | POA: Diagnosis not present

## 2018-02-02 DIAGNOSIS — Z7982 Long term (current) use of aspirin: Secondary | ICD-10-CM | POA: Diagnosis not present

## 2018-02-02 DIAGNOSIS — Z7901 Long term (current) use of anticoagulants: Secondary | ICD-10-CM | POA: Diagnosis not present

## 2018-02-02 DIAGNOSIS — H52511 Internal ophthalmoplegia (complete) (total), right eye: Secondary | ICD-10-CM | POA: Diagnosis not present

## 2018-02-02 DIAGNOSIS — R296 Repeated falls: Secondary | ICD-10-CM | POA: Diagnosis not present

## 2018-02-02 DIAGNOSIS — S81801D Unspecified open wound, right lower leg, subsequent encounter: Secondary | ICD-10-CM | POA: Diagnosis not present

## 2018-02-07 DIAGNOSIS — I480 Paroxysmal atrial fibrillation: Secondary | ICD-10-CM | POA: Diagnosis not present

## 2018-02-07 DIAGNOSIS — Z1231 Encounter for screening mammogram for malignant neoplasm of breast: Secondary | ICD-10-CM | POA: Diagnosis not present

## 2018-02-07 DIAGNOSIS — I10 Essential (primary) hypertension: Secondary | ICD-10-CM | POA: Diagnosis not present

## 2018-02-09 DIAGNOSIS — H01113 Allergic dermatitis of right eye, unspecified eyelid: Secondary | ICD-10-CM | POA: Diagnosis not present

## 2018-02-12 DIAGNOSIS — E871 Hypo-osmolality and hyponatremia: Secondary | ICD-10-CM | POA: Diagnosis not present

## 2018-02-12 DIAGNOSIS — H5461 Unqualified visual loss, right eye, normal vision left eye: Secondary | ICD-10-CM | POA: Diagnosis not present

## 2018-02-12 DIAGNOSIS — R040 Epistaxis: Secondary | ICD-10-CM | POA: Diagnosis not present

## 2018-02-12 DIAGNOSIS — S0281XD Fracture of other specified skull and facial bones, right side, subsequent encounter for fracture with routine healing: Secondary | ICD-10-CM | POA: Diagnosis not present

## 2018-02-12 DIAGNOSIS — W108XXD Fall (on) (from) other stairs and steps, subsequent encounter: Secondary | ICD-10-CM | POA: Diagnosis not present

## 2018-02-12 DIAGNOSIS — Z7982 Long term (current) use of aspirin: Secondary | ICD-10-CM | POA: Diagnosis not present

## 2018-02-12 DIAGNOSIS — T79A0XA Compartment syndrome, unspecified, initial encounter: Secondary | ICD-10-CM | POA: Diagnosis not present

## 2018-02-12 DIAGNOSIS — I729 Aneurysm of unspecified site: Secondary | ICD-10-CM | POA: Diagnosis not present

## 2018-02-12 DIAGNOSIS — S0281XA Fracture of other specified skull and facial bones, right side, initial encounter for closed fracture: Secondary | ICD-10-CM | POA: Diagnosis not present

## 2018-02-12 DIAGNOSIS — R42 Dizziness and giddiness: Secondary | ICD-10-CM | POA: Diagnosis not present

## 2018-02-12 DIAGNOSIS — I728 Aneurysm of other specified arteries: Secondary | ICD-10-CM | POA: Diagnosis not present

## 2018-02-12 DIAGNOSIS — Z7901 Long term (current) use of anticoagulants: Secondary | ICD-10-CM | POA: Diagnosis not present

## 2018-02-12 DIAGNOSIS — H05231 Hemorrhage of right orbit: Secondary | ICD-10-CM | POA: Diagnosis not present

## 2018-02-12 DIAGNOSIS — Z885 Allergy status to narcotic agent status: Secondary | ICD-10-CM | POA: Diagnosis not present

## 2018-02-12 DIAGNOSIS — Z79899 Other long term (current) drug therapy: Secondary | ICD-10-CM | POA: Diagnosis not present

## 2018-02-12 DIAGNOSIS — I447 Left bundle-branch block, unspecified: Secondary | ICD-10-CM | POA: Diagnosis not present

## 2018-02-12 DIAGNOSIS — Z8673 Personal history of transient ischemic attack (TIA), and cerebral infarction without residual deficits: Secondary | ICD-10-CM | POA: Diagnosis not present

## 2018-02-12 DIAGNOSIS — R58 Hemorrhage, not elsewhere classified: Secondary | ICD-10-CM | POA: Diagnosis not present

## 2018-02-12 DIAGNOSIS — I708 Atherosclerosis of other arteries: Secondary | ICD-10-CM | POA: Diagnosis not present

## 2018-02-12 DIAGNOSIS — W19XXXA Unspecified fall, initial encounter: Secondary | ICD-10-CM | POA: Diagnosis not present

## 2018-02-12 DIAGNOSIS — I10 Essential (primary) hypertension: Secondary | ICD-10-CM | POA: Diagnosis not present

## 2018-02-12 DIAGNOSIS — Z87891 Personal history of nicotine dependence: Secondary | ICD-10-CM | POA: Diagnosis not present

## 2018-02-12 DIAGNOSIS — H9190 Unspecified hearing loss, unspecified ear: Secondary | ICD-10-CM | POA: Diagnosis not present

## 2018-02-12 DIAGNOSIS — Z880 Allergy status to penicillin: Secondary | ICD-10-CM | POA: Diagnosis not present

## 2018-02-12 DIAGNOSIS — I4891 Unspecified atrial fibrillation: Secondary | ICD-10-CM | POA: Diagnosis not present

## 2018-02-12 DIAGNOSIS — H468 Other optic neuritis: Secondary | ICD-10-CM | POA: Diagnosis not present

## 2018-02-12 DIAGNOSIS — Z8249 Family history of ischemic heart disease and other diseases of the circulatory system: Secondary | ICD-10-CM | POA: Diagnosis not present

## 2018-02-12 DIAGNOSIS — D62 Acute posthemorrhagic anemia: Secondary | ICD-10-CM | POA: Diagnosis not present

## 2018-02-12 DIAGNOSIS — R0902 Hypoxemia: Secondary | ICD-10-CM | POA: Diagnosis not present

## 2018-02-12 DIAGNOSIS — E785 Hyperlipidemia, unspecified: Secondary | ICD-10-CM | POA: Diagnosis not present

## 2018-02-12 DIAGNOSIS — Z961 Presence of intraocular lens: Secondary | ICD-10-CM | POA: Diagnosis not present

## 2018-02-12 DIAGNOSIS — S0292XD Unspecified fracture of facial bones, subsequent encounter for fracture with routine healing: Secondary | ICD-10-CM | POA: Diagnosis not present

## 2018-02-12 DIAGNOSIS — K219 Gastro-esophageal reflux disease without esophagitis: Secondary | ICD-10-CM | POA: Diagnosis not present

## 2018-02-12 DIAGNOSIS — Z888 Allergy status to other drugs, medicaments and biological substances status: Secondary | ICD-10-CM | POA: Diagnosis not present

## 2018-02-12 DIAGNOSIS — R9431 Abnormal electrocardiogram [ECG] [EKG]: Secondary | ICD-10-CM | POA: Diagnosis not present

## 2018-02-12 DIAGNOSIS — S0511XA Contusion of eyeball and orbital tissues, right eye, initial encounter: Secondary | ICD-10-CM | POA: Diagnosis not present

## 2018-02-12 DIAGNOSIS — W19XXXD Unspecified fall, subsequent encounter: Secondary | ICD-10-CM | POA: Diagnosis not present

## 2018-02-12 DIAGNOSIS — I440 Atrioventricular block, first degree: Secondary | ICD-10-CM | POA: Diagnosis not present

## 2018-02-12 DIAGNOSIS — I2510 Atherosclerotic heart disease of native coronary artery without angina pectoris: Secondary | ICD-10-CM | POA: Diagnosis not present

## 2018-02-12 DIAGNOSIS — I481 Persistent atrial fibrillation: Secondary | ICD-10-CM | POA: Diagnosis not present

## 2018-02-12 DIAGNOSIS — E119 Type 2 diabetes mellitus without complications: Secondary | ICD-10-CM | POA: Diagnosis not present

## 2018-02-17 ENCOUNTER — Telehealth: Payer: Self-pay | Admitting: Cardiology

## 2018-02-17 NOTE — Telephone Encounter (Signed)
Pt's daughter calls today to let us know her mother had a fall with facial trauma on 6/7. Pt remained on Eliquis after her fall until recently she was noted to have pseudoaneurysms in her facial cavity. Her surgeon took her off Eliquis on 7/13, she was cauterized on 7/15. Her surgeon wanted Dr Curt Bears to know in case he felt she needed to be bridged.   I told her I would forward this message to [facility] and his RN for further assessment.

## 2018-02-17 NOTE — Telephone Encounter (Signed)
New Message:       Pt c/o medication issue:  1. Name of Medication: ELIQUIS 5 MG TABS tablet  Aspirin 81 mg  2. How are you currently taking this medication (dosage and times per day)?Take 5 mg by mouth 2 (two) times daily.   3. Are you having a reaction (difficulty breathing--STAT)? No  4. What is your medication issue? Pt's daughter states she has been off of this medication due to some surgery and she has continued to stay off of this medication and was told she should call her cardiologist and let us know of this change. Pt's daughter states if we have any question or concerns please call.

## 2018-02-18 DIAGNOSIS — H5461 Unqualified visual loss, right eye, normal vision left eye: Secondary | ICD-10-CM | POA: Diagnosis not present

## 2018-02-18 DIAGNOSIS — D649 Anemia, unspecified: Secondary | ICD-10-CM | POA: Diagnosis not present

## 2018-02-18 DIAGNOSIS — I6523 Occlusion and stenosis of bilateral carotid arteries: Secondary | ICD-10-CM | POA: Diagnosis not present

## 2018-02-18 DIAGNOSIS — Z8673 Personal history of transient ischemic attack (TIA), and cerebral infarction without residual deficits: Secondary | ICD-10-CM | POA: Diagnosis not present

## 2018-02-18 DIAGNOSIS — H52511 Internal ophthalmoplegia (complete) (total), right eye: Secondary | ICD-10-CM | POA: Diagnosis not present

## 2018-02-18 DIAGNOSIS — Z9181 History of falling: Secondary | ICD-10-CM | POA: Diagnosis not present

## 2018-02-18 DIAGNOSIS — I2510 Atherosclerotic heart disease of native coronary artery without angina pectoris: Secondary | ICD-10-CM | POA: Diagnosis not present

## 2018-02-18 DIAGNOSIS — S0231XS Fracture of orbital floor, right side, sequela: Secondary | ICD-10-CM | POA: Diagnosis not present

## 2018-02-18 DIAGNOSIS — I4891 Unspecified atrial fibrillation: Secondary | ICD-10-CM | POA: Diagnosis not present

## 2018-02-18 DIAGNOSIS — I10 Essential (primary) hypertension: Secondary | ICD-10-CM | POA: Diagnosis not present

## 2018-02-18 DIAGNOSIS — R296 Repeated falls: Secondary | ICD-10-CM | POA: Diagnosis not present

## 2018-02-18 DIAGNOSIS — Z87891 Personal history of nicotine dependence: Secondary | ICD-10-CM | POA: Diagnosis not present

## 2018-02-20 NOTE — Telephone Encounter (Signed)
Dr. Macky Lower recommendation given to pt's dtr who verbalized understanding.

## 2018-02-20 NOTE — Telephone Encounter (Signed)
No need to bridge. Continue current management. Restart eliquis as soon as possible per surgeon.

## 2018-02-21 DIAGNOSIS — H52511 Internal ophthalmoplegia (complete) (total), right eye: Secondary | ICD-10-CM | POA: Diagnosis not present

## 2018-02-21 DIAGNOSIS — Z9181 History of falling: Secondary | ICD-10-CM | POA: Diagnosis not present

## 2018-02-21 DIAGNOSIS — R296 Repeated falls: Secondary | ICD-10-CM | POA: Diagnosis not present

## 2018-02-21 DIAGNOSIS — H5461 Unqualified visual loss, right eye, normal vision left eye: Secondary | ICD-10-CM | POA: Diagnosis not present

## 2018-02-21 DIAGNOSIS — I10 Essential (primary) hypertension: Secondary | ICD-10-CM | POA: Diagnosis not present

## 2018-02-21 DIAGNOSIS — I2510 Atherosclerotic heart disease of native coronary artery without angina pectoris: Secondary | ICD-10-CM | POA: Diagnosis not present

## 2018-02-21 DIAGNOSIS — Z8673 Personal history of transient ischemic attack (TIA), and cerebral infarction without residual deficits: Secondary | ICD-10-CM | POA: Diagnosis not present

## 2018-02-21 DIAGNOSIS — S0231XS Fracture of orbital floor, right side, sequela: Secondary | ICD-10-CM | POA: Diagnosis not present

## 2018-02-21 DIAGNOSIS — Z87891 Personal history of nicotine dependence: Secondary | ICD-10-CM | POA: Diagnosis not present

## 2018-02-21 DIAGNOSIS — I6523 Occlusion and stenosis of bilateral carotid arteries: Secondary | ICD-10-CM | POA: Diagnosis not present

## 2018-02-21 DIAGNOSIS — D649 Anemia, unspecified: Secondary | ICD-10-CM | POA: Diagnosis not present

## 2018-02-21 DIAGNOSIS — I4891 Unspecified atrial fibrillation: Secondary | ICD-10-CM | POA: Diagnosis not present

## 2018-02-23 DIAGNOSIS — H468 Other optic neuritis: Secondary | ICD-10-CM | POA: Diagnosis not present

## 2018-02-23 DIAGNOSIS — Z961 Presence of intraocular lens: Secondary | ICD-10-CM | POA: Diagnosis not present

## 2018-02-23 DIAGNOSIS — Z48812 Encounter for surgical aftercare following surgery on the circulatory system: Secondary | ICD-10-CM | POA: Diagnosis not present

## 2018-02-23 DIAGNOSIS — R29898 Other symptoms and signs involving the musculoskeletal system: Secondary | ICD-10-CM | POA: Diagnosis not present

## 2018-02-23 DIAGNOSIS — S0511XS Contusion of eyeball and orbital tissues, right eye, sequela: Secondary | ICD-10-CM | POA: Diagnosis not present

## 2018-02-23 DIAGNOSIS — R42 Dizziness and giddiness: Secondary | ICD-10-CM | POA: Diagnosis not present

## 2018-02-23 DIAGNOSIS — Z9889 Other specified postprocedural states: Secondary | ICD-10-CM | POA: Diagnosis not present

## 2018-02-23 DIAGNOSIS — W19XXXS Unspecified fall, sequela: Secondary | ICD-10-CM | POA: Diagnosis not present

## 2018-02-23 DIAGNOSIS — R040 Epistaxis: Secondary | ICD-10-CM | POA: Diagnosis not present

## 2018-02-24 DIAGNOSIS — H5461 Unqualified visual loss, right eye, normal vision left eye: Secondary | ICD-10-CM | POA: Diagnosis not present

## 2018-02-24 DIAGNOSIS — I6523 Occlusion and stenosis of bilateral carotid arteries: Secondary | ICD-10-CM | POA: Diagnosis not present

## 2018-02-24 DIAGNOSIS — I2510 Atherosclerotic heart disease of native coronary artery without angina pectoris: Secondary | ICD-10-CM | POA: Diagnosis not present

## 2018-02-24 DIAGNOSIS — R296 Repeated falls: Secondary | ICD-10-CM | POA: Diagnosis not present

## 2018-02-24 DIAGNOSIS — Z8673 Personal history of transient ischemic attack (TIA), and cerebral infarction without residual deficits: Secondary | ICD-10-CM | POA: Diagnosis not present

## 2018-02-24 DIAGNOSIS — H52511 Internal ophthalmoplegia (complete) (total), right eye: Secondary | ICD-10-CM | POA: Diagnosis not present

## 2018-02-24 DIAGNOSIS — Z9181 History of falling: Secondary | ICD-10-CM | POA: Diagnosis not present

## 2018-02-24 DIAGNOSIS — Z87891 Personal history of nicotine dependence: Secondary | ICD-10-CM | POA: Diagnosis not present

## 2018-02-24 DIAGNOSIS — D649 Anemia, unspecified: Secondary | ICD-10-CM | POA: Diagnosis not present

## 2018-02-24 DIAGNOSIS — I4891 Unspecified atrial fibrillation: Secondary | ICD-10-CM | POA: Diagnosis not present

## 2018-02-24 DIAGNOSIS — I10 Essential (primary) hypertension: Secondary | ICD-10-CM | POA: Diagnosis not present

## 2018-02-24 DIAGNOSIS — S0231XS Fracture of orbital floor, right side, sequela: Secondary | ICD-10-CM | POA: Diagnosis not present

## 2018-03-01 DIAGNOSIS — I10 Essential (primary) hypertension: Secondary | ICD-10-CM | POA: Diagnosis not present

## 2018-03-01 DIAGNOSIS — I951 Orthostatic hypotension: Secondary | ICD-10-CM | POA: Diagnosis not present

## 2018-03-04 DIAGNOSIS — Z9181 History of falling: Secondary | ICD-10-CM | POA: Diagnosis not present

## 2018-03-04 DIAGNOSIS — I6523 Occlusion and stenosis of bilateral carotid arteries: Secondary | ICD-10-CM | POA: Diagnosis not present

## 2018-03-04 DIAGNOSIS — R296 Repeated falls: Secondary | ICD-10-CM | POA: Diagnosis not present

## 2018-03-04 DIAGNOSIS — H5461 Unqualified visual loss, right eye, normal vision left eye: Secondary | ICD-10-CM | POA: Diagnosis not present

## 2018-03-04 DIAGNOSIS — I4891 Unspecified atrial fibrillation: Secondary | ICD-10-CM | POA: Diagnosis not present

## 2018-03-04 DIAGNOSIS — D649 Anemia, unspecified: Secondary | ICD-10-CM | POA: Diagnosis not present

## 2018-03-04 DIAGNOSIS — I2510 Atherosclerotic heart disease of native coronary artery without angina pectoris: Secondary | ICD-10-CM | POA: Diagnosis not present

## 2018-03-04 DIAGNOSIS — I10 Essential (primary) hypertension: Secondary | ICD-10-CM | POA: Diagnosis not present

## 2018-03-04 DIAGNOSIS — S0231XS Fracture of orbital floor, right side, sequela: Secondary | ICD-10-CM | POA: Diagnosis not present

## 2018-03-04 DIAGNOSIS — H52511 Internal ophthalmoplegia (complete) (total), right eye: Secondary | ICD-10-CM | POA: Diagnosis not present

## 2018-03-04 DIAGNOSIS — Z87891 Personal history of nicotine dependence: Secondary | ICD-10-CM | POA: Diagnosis not present

## 2018-03-04 DIAGNOSIS — Z8673 Personal history of transient ischemic attack (TIA), and cerebral infarction without residual deficits: Secondary | ICD-10-CM | POA: Diagnosis not present

## 2018-03-08 ENCOUNTER — Ambulatory Visit: Payer: Medicare Other | Admitting: Cardiology

## 2018-03-08 ENCOUNTER — Encounter: Payer: Self-pay | Admitting: Cardiology

## 2018-03-08 VITALS — BP 138/68 | HR 65 | Ht 65.0 in | Wt 140.8 lb

## 2018-03-08 DIAGNOSIS — I481 Persistent atrial fibrillation: Secondary | ICD-10-CM | POA: Diagnosis not present

## 2018-03-08 DIAGNOSIS — I10 Essential (primary) hypertension: Secondary | ICD-10-CM | POA: Diagnosis not present

## 2018-03-08 DIAGNOSIS — I4819 Other persistent atrial fibrillation: Secondary | ICD-10-CM

## 2018-03-08 DIAGNOSIS — Z79899 Other long term (current) drug therapy: Secondary | ICD-10-CM | POA: Diagnosis not present

## 2018-03-08 DIAGNOSIS — I471 Supraventricular tachycardia: Secondary | ICD-10-CM

## 2018-03-08 NOTE — Patient Instructions (Addendum)
Medication Instructions:  Your physician recommends that you continue on your current medications as directed. Please refer to the Current Medication list given to you today.  * If you need a refill on your cardiac medications before your next appointment, please call your pharmacy.   Labwork: Today: TSH & LFTs *We will only notify you of abnormal results, otherwise continue current treatment plan.  Testing/Procedures: None ordered  Follow-Up: Your physician wants you to follow-up in: 6 months with Dr. Curt Bears.  You will receive a reminder letter in the mail two months in advance. If you don't receive a letter, please call our office to schedule the follow-up appointment.  *Please note that any paperwork needing to be filled out by the provider will need to be addressed at the front desk prior to seeing the provider. Please note that any FMLA, disability or other documents regarding health condition is subject to a $25.00 charge that must be received prior to completion of paperwork in the form of a money order or check.  Thank you for choosing [facility]!!   Trinidad Curet, RN [phone]  Any Other Special Instructions Will Be Listed Below (If Applicable).

## 2018-03-08 NOTE — Progress Notes (Signed)
Electrophysiology Office Note   Date:  03/08/2018   ID:  Brooke Mullins, DOB 1934-05-27, MRN [id number]  PCP:  Paulina Fusi, MD  Cardiologist:  Gypsy Balsam Primary Electrophysiologist:  Mehul Rudin Jorja Loa, MD    No chief complaint on file.    History of Present Illness: Brooke Mullins is a 84 y.o. female who is being seen today for the evaluation of atrial fibrillation at the request of Gypsy Balsam. Presenting today for electrophysiology evaluation.  Has a history of persistent atrial fibrillation, carotid artery disease with 100% occluded right carotid, diabetes, hyperlipidemia, hypertension, and PSVT.  She had an elective cardioversion on 10/17/2017.  She showed up to clinic on 11/14/2017 in atrial fibrillation.  Today, denies symptoms of palpitations, chest pain, shortness of breath, orthopnea, PND, lower extremity edema, claudication, dizziness, presyncope, syncope, bleeding, or neurologic sequela. The patient is tolerating medications without difficulties.  Overall she is doing okay.  Unfortunately after her cardioversion, she had a fall and hit her head.  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  She has had multiple surgeries on that I, and it is unclear if she Saadia Dewitt regain her vision.   Past Medical History:  Diagnosis Date  . Atrial fibrillation ([facility])   . Carotid artery stenosis with cerebral infarction ([facility])   . Carotid atherosclerosis, bilateral   . Depression   . Diabetes mellitus ([facility])   . Dyslipidemia   . Edema   . Fracture of femoral neck, right ([facility])   . Hyperplastic colon polyp   . Hypertension   . Osteoporosis   . Peripheral vascular disease of extremity ([facility])   . PSVT (paroxysmal supraventricular tachycardia) ([facility])   . Subclavian artery stenosis [facility])    Past Surgical History:  Procedure Laterality Date  . ABDOMINAL HYSTERECTOMY    . CARDIOVERSION N/A 10/17/2017   Procedure: CARDIOVERSION;  Surgeon: Thurmon Fair, MD;  Location: [facility] ENDOS[facility];  Service: Cardiovascular;  Laterality: N/A;  . CARDIOVERSION N/A 01/02/2018   Procedure: CARDIOVERSION;  Surgeon: Laurey Morale, MD;  Location: [facility] ENDOSCOPY;  Service: Cardiovascular;  Laterality: N/A;     Current Outpatient Medications  Medication Sig Dispense Refill  . acetaminophen (TYLENOL) 500 MG tablet Take 500 mg by mouth every 6 (six) hours as needed for moderate pain or headache.     . albuterol (PROAIR HFA) 108 (90 Base) MCG/ACT inhaler Inhale 1-2 puffs into the lungs every 6 (six) hours as needed for wheezing or shortness of breath.     Marland Kitchen amiodarone (PACERONE) 200 MG tablet Take 1 tablet (200 mg total) by mouth daily. 90 tablet 1  . ELIQUIS 5 MG TABS tablet Take 5 mg by mouth 2 (two) times daily.  0  . escitalopram (LEXAPRO) 20 MG tablet Take 20 mg by mouth at bedtime.    . furosemide (LASIX) 20 MG tablet Take 20 mg by mouth daily as needed for fluid.     . metoprolol tartrate (LOPRESSOR) 25 MG tablet Take 0.5 tablets by mouth 2 (two) times daily.    Marland Kitchen omeprazole (PRILOSEC) 20 MG capsule Take 20 mg by mouth at bedtime.   12  . quinapril (ACCUPRIL) 40 MG tablet TAKE ONE TABLET EVERY DAY AT BEDTIME     No current facility-administered medications for this visit.     Allergies:   Penicillins; Atorvastatin; Codeine; Colesevelam; and Ezetimibe   Social History:  The patient  reports that she has quit smoking. She has never used smokeless tobacco. She reports that she does not drink alcohol or use drugs.   Family History:  The patient's family history includes COPD in her father; Cancer in her brother; Heart failure in her mother; Hypertension in her brother.   ROS:  Please see the history of present illness.   Otherwise, review of systems is positive for visual changes (right), fall.   All other systems are reviewed and negative.   PHYSICAL EXAM: VS:  BP 138/68   Pulse 65   Ht 5\' 5"  (1.651 m)    Wt 140 lb 12.8 oz (63.9 kg)   BMI 23.43 kg/m  , BMI Body mass index is 23.43 kg/m. GEN: Well nourished, well developed, in no acute distress  HEENT: normal  Neck: no JVD, carotid bruits, or masses Cardiac: RRR; no murmurs, rubs, or gallops,no edema  Respiratory:  clear to auscultation bilaterally, normal work of breathing GI: soft, nontender, nondistended, + BS MS: no deformity or atrophy  Skin: warm and dry Neuro:  Strength and sensation are intact Psych: euthymic mood, full affect  EKG:  EKG is ordered today. Personal review of the ekg ordered shows SR, rate 65, LBBB   Recent Labs: 10/13/2017: BUN 8; Creatinine, Ser 0.76; Platelets 252 01/02/2018: Hemoglobin 12.2; Potassium 4.4; Sodium 139    Lipid Panel  No results found for: CHOL, TRIG, HDL, CHOLHDL, VLDL, LDLCALC, LDLDIRECT   Wt Readings from Last 3 Encounters:  03/08/18 140 lb 12.8 oz (63.9 kg)  01/02/18 154 lb (69.9 kg)  11/30/17 154 lb (69.9 kg)      Other studies Reviewed: Additional studies/ records that were reviewed today include: TTE 09/05/2017 Review of the above records today demonstrates:  Mild concentric LVH EF 50 to 55% Impaired diastolic filling Mildly dilated left atrium Mild to moderate mitral annular calcification Mild mitral regurgitation   ASSESSMENT AND PLAN:  1.  Persistent atrial fibrillation: Currently on Eliquis.  She was put on amiodarone and had a cardioversion.  She remains in sinus rhythm today.  No changes at this time.  This patients CHA2DS2-VASc Score and unadjusted Ischemic Stroke Rate (% per year) is equal to 9.7 % stroke rate/year from a score of 6  Above score calculated as 1 point each if present [CHF, HTN, DM, Vascular=MI/PAD/Aortic Plaque, Age if 65-74, or Female] Above score calculated as 2 points each if present [Age > 75, or Stroke/TIA/TE]  2.  Hypertension: Pressure is elevated today.  It is normal at home.  She was recently taken off of her quinapril due to dizziness.   Blood pressures at home are in the 110s.  We Jwan Hornbaker not add back at this time as her dizziness has improved.  3.  Carotid artery disease: On optimal medical therapy.  No current chest pain.  Continue with current management.  Current medicines are reviewed at length with the patient today.   The patient does not have concerns regarding her medicines.  The following changes were made today: None  Labs/ tests ordered today include:  Orders Placed This Encounter  Procedures  . TSH  . Hepatic function panel  . EKG 12-Lead   Case discussed with primary cardiology  Disposition:   FU with Shiann Kam 6 months  Signed, Henery Betzold Jorja Loa, MD  03/08/2018 2:50 PM     [facility] [address] [place] [address] [phone] (office) [phone] (fax)

## 2018-03-09 DIAGNOSIS — H5461 Unqualified visual loss, right eye, normal vision left eye: Secondary | ICD-10-CM | POA: Diagnosis not present

## 2018-03-09 DIAGNOSIS — Z87891 Personal history of nicotine dependence: Secondary | ICD-10-CM | POA: Diagnosis not present

## 2018-03-09 DIAGNOSIS — I4891 Unspecified atrial fibrillation: Secondary | ICD-10-CM | POA: Diagnosis not present

## 2018-03-09 DIAGNOSIS — I2510 Atherosclerotic heart disease of native coronary artery without angina pectoris: Secondary | ICD-10-CM | POA: Diagnosis not present

## 2018-03-09 DIAGNOSIS — H52511 Internal ophthalmoplegia (complete) (total), right eye: Secondary | ICD-10-CM | POA: Diagnosis not present

## 2018-03-09 DIAGNOSIS — R296 Repeated falls: Secondary | ICD-10-CM | POA: Diagnosis not present

## 2018-03-09 DIAGNOSIS — D649 Anemia, unspecified: Secondary | ICD-10-CM | POA: Diagnosis not present

## 2018-03-09 DIAGNOSIS — I10 Essential (primary) hypertension: Secondary | ICD-10-CM | POA: Diagnosis not present

## 2018-03-09 DIAGNOSIS — Z9181 History of falling: Secondary | ICD-10-CM | POA: Diagnosis not present

## 2018-03-09 DIAGNOSIS — Z8673 Personal history of transient ischemic attack (TIA), and cerebral infarction without residual deficits: Secondary | ICD-10-CM | POA: Diagnosis not present

## 2018-03-09 DIAGNOSIS — I6523 Occlusion and stenosis of bilateral carotid arteries: Secondary | ICD-10-CM | POA: Diagnosis not present

## 2018-03-09 DIAGNOSIS — S0231XS Fracture of orbital floor, right side, sequela: Secondary | ICD-10-CM | POA: Diagnosis not present

## 2018-03-09 LAB — HEPATIC FUNCTION PANEL
ALBUMIN: 3.9 g/dL (ref 3.5–4.7)
ALK PHOS: 89 IU/L (ref 39–117)
ALT: 10 IU/L (ref 0–32)
AST: 19 IU/L (ref 0–40)
BILIRUBIN, DIRECT: 0.11 mg/dL (ref 0.00–0.40)
Bilirubin Total: 0.3 mg/dL (ref 0.0–1.2)
TOTAL PROTEIN: 6.9 g/dL (ref 6.0–8.5)

## 2018-03-09 LAB — TSH: TSH: 13.33 u[IU]/mL — ABNORMAL HIGH (ref 0.450–4.500)

## 2018-03-10 DIAGNOSIS — I2510 Atherosclerotic heart disease of native coronary artery without angina pectoris: Secondary | ICD-10-CM | POA: Diagnosis not present

## 2018-03-10 DIAGNOSIS — I4891 Unspecified atrial fibrillation: Secondary | ICD-10-CM | POA: Diagnosis not present

## 2018-03-10 DIAGNOSIS — I6523 Occlusion and stenosis of bilateral carotid arteries: Secondary | ICD-10-CM | POA: Diagnosis not present

## 2018-03-10 DIAGNOSIS — H5461 Unqualified visual loss, right eye, normal vision left eye: Secondary | ICD-10-CM | POA: Diagnosis not present

## 2018-03-10 DIAGNOSIS — S0231XS Fracture of orbital floor, right side, sequela: Secondary | ICD-10-CM | POA: Diagnosis not present

## 2018-03-10 DIAGNOSIS — Z8673 Personal history of transient ischemic attack (TIA), and cerebral infarction without residual deficits: Secondary | ICD-10-CM | POA: Diagnosis not present

## 2018-03-10 DIAGNOSIS — Z9181 History of falling: Secondary | ICD-10-CM | POA: Diagnosis not present

## 2018-03-10 DIAGNOSIS — Z87891 Personal history of nicotine dependence: Secondary | ICD-10-CM | POA: Diagnosis not present

## 2018-03-10 DIAGNOSIS — R296 Repeated falls: Secondary | ICD-10-CM | POA: Diagnosis not present

## 2018-03-10 DIAGNOSIS — D649 Anemia, unspecified: Secondary | ICD-10-CM | POA: Diagnosis not present

## 2018-03-10 DIAGNOSIS — H52511 Internal ophthalmoplegia (complete) (total), right eye: Secondary | ICD-10-CM | POA: Diagnosis not present

## 2018-03-10 DIAGNOSIS — I10 Essential (primary) hypertension: Secondary | ICD-10-CM | POA: Diagnosis not present

## 2018-03-13 DIAGNOSIS — H5461 Unqualified visual loss, right eye, normal vision left eye: Secondary | ICD-10-CM | POA: Diagnosis not present

## 2018-03-13 DIAGNOSIS — D649 Anemia, unspecified: Secondary | ICD-10-CM | POA: Diagnosis not present

## 2018-03-13 DIAGNOSIS — Z9181 History of falling: Secondary | ICD-10-CM | POA: Diagnosis not present

## 2018-03-13 DIAGNOSIS — S0231XS Fracture of orbital floor, right side, sequela: Secondary | ICD-10-CM | POA: Diagnosis not present

## 2018-03-13 DIAGNOSIS — I2510 Atherosclerotic heart disease of native coronary artery without angina pectoris: Secondary | ICD-10-CM | POA: Diagnosis not present

## 2018-03-13 DIAGNOSIS — H52511 Internal ophthalmoplegia (complete) (total), right eye: Secondary | ICD-10-CM | POA: Diagnosis not present

## 2018-03-13 DIAGNOSIS — R296 Repeated falls: Secondary | ICD-10-CM | POA: Diagnosis not present

## 2018-03-13 DIAGNOSIS — I10 Essential (primary) hypertension: Secondary | ICD-10-CM | POA: Diagnosis not present

## 2018-03-13 DIAGNOSIS — I6523 Occlusion and stenosis of bilateral carotid arteries: Secondary | ICD-10-CM | POA: Diagnosis not present

## 2018-03-13 DIAGNOSIS — I4891 Unspecified atrial fibrillation: Secondary | ICD-10-CM | POA: Diagnosis not present

## 2018-03-13 DIAGNOSIS — Z87891 Personal history of nicotine dependence: Secondary | ICD-10-CM | POA: Diagnosis not present

## 2018-03-13 DIAGNOSIS — Z8673 Personal history of transient ischemic attack (TIA), and cerebral infarction without residual deficits: Secondary | ICD-10-CM | POA: Diagnosis not present

## 2018-03-16 DIAGNOSIS — S0240ED Zygomatic fracture, right side, subsequent encounter for fracture with routine healing: Secondary | ICD-10-CM | POA: Diagnosis not present

## 2018-03-16 DIAGNOSIS — S0280XD Fracture of other specified skull and facial bones, unspecified side, subsequent encounter for fracture with routine healing: Secondary | ICD-10-CM | POA: Diagnosis not present

## 2018-03-16 DIAGNOSIS — S02401D Maxillary fracture, unspecified, subsequent encounter for fracture with routine healing: Secondary | ICD-10-CM | POA: Diagnosis not present

## 2018-03-21 DIAGNOSIS — Z139 Encounter for screening, unspecified: Secondary | ICD-10-CM | POA: Diagnosis not present

## 2018-03-21 DIAGNOSIS — Z9181 History of falling: Secondary | ICD-10-CM | POA: Diagnosis not present

## 2018-03-21 DIAGNOSIS — Z87891 Personal history of nicotine dependence: Secondary | ICD-10-CM | POA: Diagnosis not present

## 2018-03-21 DIAGNOSIS — D649 Anemia, unspecified: Secondary | ICD-10-CM | POA: Diagnosis not present

## 2018-03-21 DIAGNOSIS — E785 Hyperlipidemia, unspecified: Secondary | ICD-10-CM | POA: Diagnosis not present

## 2018-03-21 DIAGNOSIS — Z8673 Personal history of transient ischemic attack (TIA), and cerebral infarction without residual deficits: Secondary | ICD-10-CM | POA: Diagnosis not present

## 2018-03-21 DIAGNOSIS — R296 Repeated falls: Secondary | ICD-10-CM | POA: Diagnosis not present

## 2018-03-21 DIAGNOSIS — Z79899 Other long term (current) drug therapy: Secondary | ICD-10-CM | POA: Diagnosis not present

## 2018-03-21 DIAGNOSIS — I6523 Occlusion and stenosis of bilateral carotid arteries: Secondary | ICD-10-CM | POA: Diagnosis not present

## 2018-03-21 DIAGNOSIS — S0231XS Fracture of orbital floor, right side, sequela: Secondary | ICD-10-CM | POA: Diagnosis not present

## 2018-03-21 DIAGNOSIS — H52511 Internal ophthalmoplegia (complete) (total), right eye: Secondary | ICD-10-CM | POA: Diagnosis not present

## 2018-03-21 DIAGNOSIS — H5461 Unqualified visual loss, right eye, normal vision left eye: Secondary | ICD-10-CM | POA: Diagnosis not present

## 2018-03-21 DIAGNOSIS — I480 Paroxysmal atrial fibrillation: Secondary | ICD-10-CM | POA: Diagnosis not present

## 2018-03-21 DIAGNOSIS — E119 Type 2 diabetes mellitus without complications: Secondary | ICD-10-CM | POA: Diagnosis not present

## 2018-03-21 DIAGNOSIS — I10 Essential (primary) hypertension: Secondary | ICD-10-CM | POA: Diagnosis not present

## 2018-03-21 DIAGNOSIS — I2510 Atherosclerotic heart disease of native coronary artery without angina pectoris: Secondary | ICD-10-CM | POA: Diagnosis not present

## 2018-03-21 DIAGNOSIS — I4891 Unspecified atrial fibrillation: Secondary | ICD-10-CM | POA: Diagnosis not present

## 2018-04-04 DIAGNOSIS — Z1231 Encounter for screening mammogram for malignant neoplasm of breast: Secondary | ICD-10-CM | POA: Diagnosis not present

## 2018-04-11 DIAGNOSIS — I6523 Occlusion and stenosis of bilateral carotid arteries: Secondary | ICD-10-CM | POA: Diagnosis not present

## 2018-04-11 DIAGNOSIS — S0240AD Malar fracture, right side, subsequent encounter for fracture with routine healing: Secondary | ICD-10-CM | POA: Diagnosis not present

## 2018-04-11 DIAGNOSIS — S0011XD Contusion of right eyelid and periocular area, subsequent encounter: Secondary | ICD-10-CM | POA: Diagnosis not present

## 2018-04-11 DIAGNOSIS — M7989 Other specified soft tissue disorders: Secondary | ICD-10-CM | POA: Diagnosis not present

## 2018-04-11 DIAGNOSIS — S0240ED Zygomatic fracture, right side, subsequent encounter for fracture with routine healing: Secondary | ICD-10-CM | POA: Diagnosis not present

## 2018-04-11 DIAGNOSIS — S02401D Maxillary fracture, unspecified, subsequent encounter for fracture with routine healing: Secondary | ICD-10-CM | POA: Diagnosis not present

## 2018-04-11 DIAGNOSIS — J320 Chronic maxillary sinusitis: Secondary | ICD-10-CM | POA: Diagnosis not present

## 2018-04-11 DIAGNOSIS — Z9889 Other specified postprocedural states: Secondary | ICD-10-CM | POA: Diagnosis not present

## 2018-04-11 DIAGNOSIS — I672 Cerebral atherosclerosis: Secondary | ICD-10-CM | POA: Diagnosis not present

## 2018-04-11 DIAGNOSIS — S15101D Unspecified injury of right vertebral artery, subsequent encounter: Secondary | ICD-10-CM | POA: Diagnosis not present

## 2018-04-24 DIAGNOSIS — I110 Hypertensive heart disease with heart failure: Secondary | ICD-10-CM | POA: Diagnosis not present

## 2018-04-24 DIAGNOSIS — J9601 Acute respiratory failure with hypoxia: Secondary | ICD-10-CM | POA: Diagnosis not present

## 2018-04-24 DIAGNOSIS — R7989 Other specified abnormal findings of blood chemistry: Secondary | ICD-10-CM | POA: Diagnosis not present

## 2018-04-24 DIAGNOSIS — R0602 Shortness of breath: Secondary | ICD-10-CM | POA: Diagnosis not present

## 2018-04-24 DIAGNOSIS — E039 Hypothyroidism, unspecified: Secondary | ICD-10-CM | POA: Diagnosis not present

## 2018-04-24 DIAGNOSIS — Z9289 Personal history of other medical treatment: Secondary | ICD-10-CM

## 2018-04-24 DIAGNOSIS — R05 Cough: Secondary | ICD-10-CM | POA: Diagnosis not present

## 2018-04-24 DIAGNOSIS — D649 Anemia, unspecified: Secondary | ICD-10-CM | POA: Diagnosis not present

## 2018-04-24 DIAGNOSIS — R42 Dizziness and giddiness: Secondary | ICD-10-CM | POA: Diagnosis not present

## 2018-04-24 DIAGNOSIS — I509 Heart failure, unspecified: Secondary | ICD-10-CM | POA: Diagnosis not present

## 2018-04-24 DIAGNOSIS — I214 Non-ST elevation (NSTEMI) myocardial infarction: Secondary | ICD-10-CM | POA: Diagnosis not present

## 2018-04-24 DIAGNOSIS — I959 Hypotension, unspecified: Secondary | ICD-10-CM | POA: Diagnosis not present

## 2018-04-24 DIAGNOSIS — R0902 Hypoxemia: Secondary | ICD-10-CM | POA: Diagnosis not present

## 2018-04-24 DIAGNOSIS — Z87891 Personal history of nicotine dependence: Secondary | ICD-10-CM | POA: Diagnosis not present

## 2018-04-24 DIAGNOSIS — Z743 Need for continuous supervision: Secondary | ICD-10-CM | POA: Diagnosis not present

## 2018-04-24 DIAGNOSIS — E1165 Type 2 diabetes mellitus with hyperglycemia: Secondary | ICD-10-CM | POA: Diagnosis not present

## 2018-04-24 DIAGNOSIS — K219 Gastro-esophageal reflux disease without esophagitis: Secondary | ICD-10-CM | POA: Diagnosis not present

## 2018-04-24 HISTORY — DX: Personal history of other medical treatment: Z92.89

## 2018-04-25 ENCOUNTER — Other Ambulatory Visit: Payer: Self-pay

## 2018-04-25 ENCOUNTER — Inpatient Hospital Stay (HOSPITAL_COMMUNITY)
Admission: EM | Admit: 2018-04-25 | Discharge: 2018-04-28 | DRG: 280 | Disposition: A | Discharge status: Home Health | Payer: Medicare Other | Source: Other Acute Inpatient Hospital | Attending: Cardiology | Admitting: Cardiology

## 2018-04-25 ENCOUNTER — Inpatient Hospital Stay (HOSPITAL_COMMUNITY): Payer: Medicare Other

## 2018-04-25 ENCOUNTER — Encounter (HOSPITAL_COMMUNITY): Payer: Self-pay | Admitting: General Practice

## 2018-04-25 DIAGNOSIS — E871 Hypo-osmolality and hyponatremia: Secondary | ICD-10-CM

## 2018-04-25 DIAGNOSIS — R7989 Other specified abnormal findings of blood chemistry: Secondary | ICD-10-CM | POA: Diagnosis present

## 2018-04-25 DIAGNOSIS — I447 Left bundle-branch block, unspecified: Secondary | ICD-10-CM | POA: Diagnosis present

## 2018-04-25 DIAGNOSIS — M810 Age-related osteoporosis without current pathological fracture: Secondary | ICD-10-CM | POA: Diagnosis not present

## 2018-04-25 DIAGNOSIS — Z9181 History of falling: Secondary | ICD-10-CM

## 2018-04-25 DIAGNOSIS — E785 Hyperlipidemia, unspecified: Secondary | ICD-10-CM | POA: Diagnosis present

## 2018-04-25 DIAGNOSIS — I361 Nonrheumatic tricuspid (valve) insufficiency: Secondary | ICD-10-CM | POA: Diagnosis not present

## 2018-04-25 DIAGNOSIS — E039 Hypothyroidism, unspecified: Secondary | ICD-10-CM | POA: Diagnosis not present

## 2018-04-25 DIAGNOSIS — I481 Persistent atrial fibrillation: Secondary | ICD-10-CM | POA: Diagnosis present

## 2018-04-25 DIAGNOSIS — Z888 Allergy status to other drugs, medicaments and biological substances status: Secondary | ICD-10-CM

## 2018-04-25 DIAGNOSIS — I110 Hypertensive heart disease with heart failure: Principal | ICD-10-CM | POA: Diagnosis present

## 2018-04-25 DIAGNOSIS — R05 Cough: Secondary | ICD-10-CM | POA: Diagnosis not present

## 2018-04-25 DIAGNOSIS — I083 Combined rheumatic disorders of mitral, aortic and tricuspid valves: Secondary | ICD-10-CM | POA: Diagnosis not present

## 2018-04-25 DIAGNOSIS — Z23 Encounter for immunization: Secondary | ICD-10-CM

## 2018-04-25 DIAGNOSIS — J189 Pneumonia, unspecified organism: Secondary | ICD-10-CM | POA: Diagnosis not present

## 2018-04-25 DIAGNOSIS — I21A1 Myocardial infarction type 2: Secondary | ICD-10-CM | POA: Diagnosis present

## 2018-04-25 DIAGNOSIS — H5440 Blindness, one eye, unspecified eye: Secondary | ICD-10-CM | POA: Diagnosis not present

## 2018-04-25 DIAGNOSIS — I2510 Atherosclerotic heart disease of native coronary artery without angina pectoris: Secondary | ICD-10-CM | POA: Diagnosis not present

## 2018-04-25 DIAGNOSIS — I214 Non-ST elevation (NSTEMI) myocardial infarction: Secondary | ICD-10-CM | POA: Diagnosis not present

## 2018-04-25 DIAGNOSIS — R059 Cough, unspecified: Secondary | ICD-10-CM

## 2018-04-25 DIAGNOSIS — Z87891 Personal history of nicotine dependence: Secondary | ICD-10-CM | POA: Diagnosis not present

## 2018-04-25 DIAGNOSIS — R0602 Shortness of breath: Secondary | ICD-10-CM | POA: Diagnosis not present

## 2018-04-25 DIAGNOSIS — E854 Organ-limited amyloidosis: Secondary | ICD-10-CM | POA: Diagnosis present

## 2018-04-25 DIAGNOSIS — Z880 Allergy status to penicillin: Secondary | ICD-10-CM

## 2018-04-25 DIAGNOSIS — I700 Atherosclerosis of aorta: Secondary | ICD-10-CM | POA: Diagnosis present

## 2018-04-25 DIAGNOSIS — Z681 Body mass index (BMI) 19 or less, adult: Secondary | ICD-10-CM

## 2018-04-25 DIAGNOSIS — D509 Iron deficiency anemia, unspecified: Secondary | ICD-10-CM | POA: Diagnosis not present

## 2018-04-25 DIAGNOSIS — I4819 Other persistent atrial fibrillation: Secondary | ICD-10-CM | POA: Diagnosis present

## 2018-04-25 DIAGNOSIS — D649 Anemia, unspecified: Secondary | ICD-10-CM | POA: Diagnosis not present

## 2018-04-25 DIAGNOSIS — J9601 Acute respiratory failure with hypoxia: Secondary | ICD-10-CM | POA: Diagnosis present

## 2018-04-25 DIAGNOSIS — L8990 Pressure ulcer of unspecified site, unspecified stage: Secondary | ICD-10-CM | POA: Diagnosis present

## 2018-04-25 DIAGNOSIS — I5031 Acute diastolic (congestive) heart failure: Secondary | ICD-10-CM | POA: Diagnosis not present

## 2018-04-25 DIAGNOSIS — R0600 Dyspnea, unspecified: Secondary | ICD-10-CM

## 2018-04-25 DIAGNOSIS — Z7901 Long term (current) use of anticoagulants: Secondary | ICD-10-CM

## 2018-04-25 DIAGNOSIS — Z885 Allergy status to narcotic agent status: Secondary | ICD-10-CM

## 2018-04-25 DIAGNOSIS — I5030 Unspecified diastolic (congestive) heart failure: Secondary | ICD-10-CM | POA: Diagnosis present

## 2018-04-25 DIAGNOSIS — J9621 Acute and chronic respiratory failure with hypoxia: Secondary | ICD-10-CM | POA: Diagnosis not present

## 2018-04-25 DIAGNOSIS — E1151 Type 2 diabetes mellitus with diabetic peripheral angiopathy without gangrene: Secondary | ICD-10-CM | POA: Diagnosis not present

## 2018-04-25 DIAGNOSIS — E876 Hypokalemia: Secondary | ICD-10-CM | POA: Diagnosis not present

## 2018-04-25 DIAGNOSIS — I10 Essential (primary) hypertension: Secondary | ICD-10-CM | POA: Diagnosis present

## 2018-04-25 DIAGNOSIS — I2729 Other secondary pulmonary hypertension: Secondary | ICD-10-CM | POA: Diagnosis present

## 2018-04-25 DIAGNOSIS — K219 Gastro-esophageal reflux disease without esophagitis: Secondary | ICD-10-CM | POA: Diagnosis not present

## 2018-04-25 DIAGNOSIS — Z79899 Other long term (current) drug therapy: Secondary | ICD-10-CM

## 2018-04-25 DIAGNOSIS — I43 Cardiomyopathy in diseases classified elsewhere: Secondary | ICD-10-CM | POA: Diagnosis not present

## 2018-04-25 DIAGNOSIS — Z85828 Personal history of other malignant neoplasm of skin: Secondary | ICD-10-CM

## 2018-04-25 DIAGNOSIS — I509 Heart failure, unspecified: Secondary | ICD-10-CM | POA: Diagnosis not present

## 2018-04-25 HISTORY — DX: Unspecified malignant neoplasm of skin, unspecified: C44.90

## 2018-04-25 HISTORY — DX: Hypothyroidism, unspecified: E03.9

## 2018-04-25 HISTORY — DX: Personal history of other medical treatment: Z92.89

## 2018-04-25 HISTORY — DX: Unspecified chronic bronchitis: J42

## 2018-04-25 LAB — CBC WITH DIFFERENTIAL/PLATELET
Abs Immature Granulocytes: 0 10*3/uL (ref 0.0–0.1)
BASOS ABS: 0 10*3/uL (ref 0.0–0.1)
BASOS PCT: 1 %
EOS ABS: 0.1 10*3/uL (ref 0.0–0.7)
Eosinophils Relative: 1 %
HCT: 30.3 % — ABNORMAL LOW (ref 36.0–46.0)
Hemoglobin: 9.2 g/dL — ABNORMAL LOW (ref 12.0–15.0)
IMMATURE GRANULOCYTES: 0 %
Lymphocytes Relative: 17 %
Lymphs Abs: 1.4 10*3/uL (ref 0.7–4.0)
MCH: 23.5 pg — ABNORMAL LOW (ref 26.0–34.0)
MCHC: 30.4 g/dL (ref 30.0–36.0)
MCV: 77.3 fL — AB (ref 78.0–100.0)
MONOS PCT: 10 %
Monocytes Absolute: 0.8 10*3/uL (ref 0.1–1.0)
NEUTROS ABS: 5.6 10*3/uL (ref 1.7–7.7)
Neutrophils Relative %: 71 %
PLATELETS: 334 10*3/uL (ref 150–400)
RBC: 3.92 MIL/uL (ref 3.87–5.11)
RDW: 17.7 % — AB (ref 11.5–15.5)
WBC: 7.8 10*3/uL (ref 4.0–10.5)

## 2018-04-25 LAB — COMPREHENSIVE METABOLIC PANEL
ALT: 45 U/L — ABNORMAL HIGH (ref 0–44)
AST: 60 U/L — AB (ref 15–41)
Albumin: 2.7 g/dL — ABNORMAL LOW (ref 3.5–5.0)
Alkaline Phosphatase: 144 U/L — ABNORMAL HIGH (ref 38–126)
Anion gap: 14 (ref 5–15)
BUN: 17 mg/dL (ref 8–23)
CHLORIDE: 100 mmol/L (ref 98–111)
CO2: 23 mmol/L (ref 22–32)
Calcium: 8.8 mg/dL — ABNORMAL LOW (ref 8.9–10.3)
Creatinine, Ser: 0.85 mg/dL (ref 0.44–1.00)
Glucose, Bld: 89 mg/dL (ref 70–99)
POTASSIUM: 3.8 mmol/L (ref 3.5–5.1)
SODIUM: 137 mmol/L (ref 135–145)
Total Bilirubin: 0.6 mg/dL (ref 0.3–1.2)
Total Protein: 6.6 g/dL (ref 6.5–8.1)

## 2018-04-25 LAB — BRAIN NATRIURETIC PEPTIDE: B NATRIURETIC PEPTIDE 5: 398.4 pg/mL — AB (ref 0.0–100.0)

## 2018-04-25 LAB — PROTIME-INR
INR: 1.37
PROTHROMBIN TIME: 16.8 s — AB (ref 11.4–15.2)

## 2018-04-25 LAB — MAGNESIUM: Magnesium: 2 mg/dL (ref 1.7–2.4)

## 2018-04-25 LAB — PROCALCITONIN: Procalcitonin: 0.15 ng/mL

## 2018-04-25 LAB — MRSA PCR SCREENING: MRSA by PCR: NEGATIVE

## 2018-04-25 LAB — TSH: TSH: 6.142 u[IU]/mL — ABNORMAL HIGH (ref 0.350–4.500)

## 2018-04-25 LAB — TROPONIN I: Troponin I: 0.75 ng/mL (ref <0.03)

## 2018-04-25 LAB — LACTIC ACID, PLASMA: Lactic Acid, Venous: 1.6 mmol/L (ref 0.5–1.9)

## 2018-04-25 MED ORDER — SODIUM CHLORIDE 0.9% FLUSH
3.0000 mL | Freq: Two times a day (BID) | INTRAVENOUS | Status: DC
  Administered 2018-04-25 – 2018-04-28 (×6): 3 mL via INTRAVENOUS

## 2018-04-25 MED ORDER — ACETAMINOPHEN 500 MG PO TABS
500.0000 mg | ORAL_TABLET | Freq: Four times a day (QID) | ORAL | Status: DC | PRN
  Administered 2018-04-27: 500 mg via ORAL
  Filled 2018-04-25: qty 1

## 2018-04-25 MED ORDER — HEPARIN (PORCINE) IN NACL 100-0.45 UNIT/ML-% IJ SOLN
1100.0000 [IU]/h | INTRAMUSCULAR | Status: DC
  Administered 2018-04-25: 1000 [IU]/h via INTRAVENOUS
  Administered 2018-04-26: 1100 [IU]/h via INTRAVENOUS
  Filled 2018-04-25 (×2): qty 250

## 2018-04-25 MED ORDER — INFLUENZA VAC SPLIT HIGH-DOSE 0.5 ML IM SUSY
0.5000 mL | PREFILLED_SYRINGE | INTRAMUSCULAR | Status: AC
  Administered 2018-04-28: 0.5 mL via INTRAMUSCULAR
  Filled 2018-04-25 (×2): qty 0.5

## 2018-04-25 MED ORDER — NITROGLYCERIN 2 % TD OINT
0.5000 [in_us] | TOPICAL_OINTMENT | Freq: Four times a day (QID) | TRANSDERMAL | Status: DC
  Administered 2018-04-26 – 2018-04-28 (×9): 0.5 [in_us] via TOPICAL
  Filled 2018-04-25: qty 30

## 2018-04-25 MED ORDER — SODIUM CHLORIDE 0.9% FLUSH: 3.0000 mL | INTRAVENOUS | Status: DC | PRN

## 2018-04-25 MED ORDER — ENSURE ENLIVE PO LIQD
237.0000 mL | Freq: Two times a day (BID) | ORAL | Status: DC
  Administered 2018-04-26 – 2018-04-28 (×4): 237 mL via ORAL

## 2018-04-25 MED ORDER — PANTOPRAZOLE SODIUM 40 MG PO TBEC
40.0000 mg | DELAYED_RELEASE_TABLET | Freq: Every day | ORAL | Status: DC
  Administered 2018-04-25 – 2018-04-28 (×3): 40 mg via ORAL
  Filled 2018-04-25 (×4): qty 1

## 2018-04-25 MED ORDER — FUROSEMIDE 10 MG/ML IJ SOLN
60.0000 mg | Freq: Every day | INTRAMUSCULAR | Status: DC
  Administered 2018-04-25 – 2018-04-27 (×3): 60 mg via INTRAVENOUS
  Filled 2018-04-25 (×4): qty 6

## 2018-04-25 MED ORDER — ACETAMINOPHEN 325 MG PO TABS: 650.0000 mg | ORAL_TABLET | ORAL | Status: DC | PRN

## 2018-04-25 MED ORDER — ONDANSETRON HCL 4 MG/2ML IJ SOLN: 4.0000 mg | Freq: Four times a day (QID) | INTRAMUSCULAR | Status: DC | PRN

## 2018-04-25 MED ORDER — ORAL CARE MOUTH RINSE
15.0000 mL | Freq: Two times a day (BID) | OROMUCOSAL | Status: DC
  Administered 2018-04-25 – 2018-04-28 (×5): 15 mL via OROMUCOSAL

## 2018-04-25 MED ORDER — AMIODARONE HCL 200 MG PO TABS
200.0000 mg | ORAL_TABLET | Freq: Every day | ORAL | Status: DC
  Administered 2018-04-25 – 2018-04-28 (×4): 200 mg via ORAL
  Filled 2018-04-25 (×4): qty 1

## 2018-04-25 MED ORDER — SODIUM CHLORIDE 0.9 % IV SOLN
250.0000 mL | INTRAVENOUS | Status: DC | PRN
  Administered 2018-04-25 – 2018-04-26 (×2): 250 mL via INTRAVENOUS

## 2018-04-25 MED ORDER — ESCITALOPRAM OXALATE 20 MG PO TABS
20.0000 mg | ORAL_TABLET | Freq: Every day | ORAL | Status: DC
  Administered 2018-04-25 – 2018-04-27 (×3): 20 mg via ORAL
  Filled 2018-04-25 (×3): qty 1

## 2018-04-25 NOTE — H&P (Addendum)
Cardiology Admission History and Physical:   Patient ID: Brooke Mullins MRN: [id number]; DOB: 09-19-1933   Admission date: 04/25/2018  Primary Care Provider: [name], MD Primary Cardiologist: Drue Dun Primary Electrophysiologist:  Curt Bears  Chief Complaint:  SOB  Patient Profile:   Brooke Mullins is a 84 y.o. female with y of persistent afib, chronic LBBB, carotid stensos, DM2, HL, PSVT, HTN. History of fall with bleeding of right eye and vision loss. History of right maxillary artery embolization recently at [facility] 01/2018. Admitted to [facility] with SOB.   History of Present Illness:   Brooke Mullins 84 yo female with history of persistent afib, chronic LBBB, carotid stensos, DM2, HL, PSVT, HTN. History of fall with bleeding of right eye and vision loss. History of right maxillary artery embolization recently at [facility] 01/2018.  Has been on amiodarone with prior cardioversion for her afib.   Family reporst 1 week of progressive SOB. Patient reports productive cough with yellow sputum, subjective fevers and chills. No chest pain. SYmptoms progress, severe on Saturday.   Admitted to [facility] 04/24/18 with dizziness, SOB, nonproductive cough. ER notes indicate she was initially hypotensive, bp's improved with IVFs 5110mL NS. BPs quickly increased to 160s, she was later given IV lasix.  Had hypoxia, started on bipap.   From notes patient discussed with on call Dr Gilmore Laroche, recs were for tranfer to [facility] but no beds available initially.    Labs: WBC 11.6 Hgb 8.3 Plt 418 Na 129 K 5 BUN 21 Cr 1.2 Ddimer 799 hTSH18.6 Free T4 2.49 proBNP 13700 AST 125 Procalcitonin 0.17 lactic acid 1.2  Blood cultures pending at [facility], neg thus far Trop 1.36-->1.23-->1.19--> CXR faint haziness RLL base superimponed on mild interstitial lung disease. Increased fullness in region of azygos vein, could be adenopathy or mass. \ 04/2018 CT head: no acute process 04/2018 CT PE: no  PE, patchy hazy pattern edema vs infection vs inflammatory process EKG SR, LBBB   09/2017 echo LVEF 63-78%, grade I diastolic dysfunction,mod TR  Past Medical History:  Diagnosis Date  . Atrial fibrillation ([place])   . Carotid artery stenosis with cerebral infarction ([place])   . Carotid atherosclerosis, bilateral   . Depression   . Diabetes mellitus ([place])   . Dyslipidemia   . Edema   . Fracture of femoral neck, right ([place])   . Hyperplastic colon polyp   . Hypertension   . Osteoporosis   . Peripheral vascular disease of extremity ([place])   . PSVT (paroxysmal supraventricular tachycardia) ([place])   . Subclavian artery stenosis [facility])     Past Surgical History:  Procedure Laterality Date  . ABDOMINAL HYSTERECTOMY    . CARDIOVERSION N/A 10/17/2017   Procedure: CARDIOVERSION;  Surgeon: Sanda Klein, MD;  Location: [facility] ENDOSCOPY;  Service: Cardiovascular;  Laterality: N/A;  . CARDIOVERSION N/A 01/02/2018   Procedure: CARDIOVERSION;  Surgeon: Larey Dresser, MD;  Location: [facility] ENDOSCOPY;  Service: Cardiovascular;  Laterality: N/A;     Medications Prior to Admission: Prior to Admission medications   Medication Sig Start Date End Date Taking? Authorizing Provider  acetaminophen (TYLENOL) 500 MG tablet Take 500 mg by mouth every 6 (six) hours as needed for moderate pain or headache.    Yes [provider]  ELIQUIS 5 MG TABS tablet Take 5 mg by mouth 2 (two) times daily. 09/06/17  Yes [provider]  escitalopram (LEXAPRO) 20 MG tablet Take 20 mg by mouth at bedtime.   Yes [provider]  furosemide (LASIX) 20 MG tablet Take 20 mg by mouth daily as needed for fluid.  08/23/15  Yes [provider]  omeprazole (PRILOSEC) 20 MG capsule Take 20 mg by mouth at bedtime.  09/09/17  Yes [provider]  albuterol (PROAIR HFA) 108 (90 Base) MCG/ACT inhaler Inhale 1-2 puffs into the lungs every 6 (six) hours as needed for wheezing or shortness of breath.  08/23/15    [provider]  amiodarone (PACERONE) 200 MG tablet Take 1 tablet (200 mg total) by mouth daily. Patient not taking: Reported on 04/25/2018 11/30/17   Constance Haw, MD     Allergies:    Allergies  Allergen Reactions  . Penicillins Anaphylaxis and Other (See Comments)    Has patient had a PCN reaction causing immediate rash, facial/tongue/throat swelling, SOB or lightheadedness with hypotension: Yes Has patient had a PCN reaction causing severe rash involving mucus membranes or skin necrosis: No Has patient had a PCN reaction that required hospitalization: No Has patient had a PCN reaction occurring within the last 10 years: No If all of the above answers are "NO", then may proceed with Cephalosporin use.   . Atorvastatin Other (See Comments)    Muscle pain  . Codeine Other (See Comments)    Nausea/Vomiting  . Colesevelam Other (See Comments)    Unsure of exact reaction type  . Ezetimibe Other (See Comments)    Pain in legs    Social History:   Social History   Socioeconomic History  . Marital status: Married    Spouse name: Not on file  . Number of children: Not on file  . Years of education: Not on file  . Highest education level: Not on file  Occupational History  . Not on file  Social Needs  . Financial resource strain: Not on file  . Food insecurity:    Worry: Not on file    Inability: Not on file  . Transportation needs:    Medical: Not on file    Non-medical: Not on file  Tobacco Use  . Smoking status: Former Research scientist (life sciences)  . Smokeless tobacco: Never Used  Substance and Sexual Activity  . Alcohol use: No    Frequency: Never  . Drug use: No  . Sexual activity: Not on file  Lifestyle  . Physical activity:    Days per week: Not on file    Minutes per session: Not on file  . Stress: Not on file  Relationships  . Social connections:    Talks on phone: Not on file    Gets together: Not on file    Attends religious service: Not on file    Active  member of club or organization: Not on file    Attends meetings of clubs or organizations: Not on file    Relationship status: Not on file  . Intimate partner violence:    Fear of current or ex partner: Not on file    Emotionally abused: Not on file    Physically abused: Not on file    Forced sexual activity: Not on file  Other Topics Concern  . Not on file  Social History Narrative  . Not on file    Family History:   The patient's family history includes COPD in her father; Cancer in her brother; Heart failure in her mother; Hypertension in her brother.    ROS:  Please see the history of present illness.  All other ROS reviewed and negative.  Physical Exam/Data:   Vitals:   04/25/18 1812 04/25/18 1814  BP:  (!) 165/73  Pulse: 74   Resp: (!) 34 (!) 23  Temp:  97.7 F (36.5 C)  TempSrc:  Oral  SpO2: 98% 100%  Weight:  52.4 kg  Height:  5\' 4"  (1.626 m)   No intake or output data in the 24 hours ending 04/25/18 1853 Filed Weights   04/25/18 1814  Weight: 52.4 kg   Body mass index is 19.83 kg/m.  General:  Well nourished, well developed, in no acute distress HEENT: normal Lymph: no adenopathy Neck: elevated JVD Endocrine:  No thryomegaly Cardiac:  ; RRR; no m/r/g, Lungs:  Coarse bilaterally Abd: soft, nontender, no hepatomegaly  Ext: no edema Musculoskeletal:  No deformities, BUE and BLE strength normal and equal Skin: warm and dry  Neuro:  CNs 2-12 intact, no focal abnormalities noted Psych:  Normal affect   Laboratory Data:  ChemistryNo results for input(s): NA, K, CL, CO2, GLUCOSE, BUN, CREATININE, CALCIUM, GFRNONAA, GFRAA, ANIONGAP in the last 168 hours.  No results for input(s): PROT, ALBUMIN, AST, ALT, ALKPHOS, BILITOT in the last 168 hours. HematologyNo results for input(s): WBC, RBC, HGB, HCT, MCV, MCH, MCHC, RDW, PLT in the last 168 hours. Cardiac EnzymesNo results for input(s): TROPONINI in the last 168 hours. No results for input(s): TROPIPOC in  the last 168 hours.  BNPNo results for input(s): BNP, PROBNP in the last 168 hours.  DDimer No results for input(s): DDIMER in the last 168 hours.  Radiology/Studies:  No results found.  Assessment and Plan:   1. Hypoxia - diffuse haziness on CXR and [facility] at [facility], elevated BNP would suggest fluid overload, possibly diatolic HF based on her last echo 09/2017 - currently on bipap - likely multifactorial, heart failure is certanintly playing a role. She reports symptoms of productive cough, fevers, and chills that suggest a pneumonia could be playing a role as well.  - will diurese with IV lasix, dose 40mg  IV today. Will need echo tomorrow AM.  - check respiratory panel. Will consult medicine, looks to meet criteria for possible HCAP given [facility] admit 01/2018, will need assistance with management  2. Elevated troponin - in setting of volume overload, hypoxia, anemia. Trop trending down. Has not had chest pain.  - EKG chronic LBBB - f/u repeat echo. Once respiratory status improved and Hgb trends may require cath this admit.  - CT PE negative at OSH   3. Anemia - Hgb 09/2017 12.1, from June 2019 istat Hgb 12.2 - admit Hgb 8.3, repeat following day at [facility] 9.9. Transfused 1 unit - she denies any overt bleeding.  - follow H&H, continue heparin for now in setting of elevated troponin and possible ACS.  - check FOBT. Family reports negative at OSH, cannot find documentation.    I have consulted medicine service to help manage patients possible HCAP pneumonia as well as anemia.   For questions or updates, please contact [place] Please consult [email or web address] for contact info under        Signed, Carlyle Dolly, MD  04/25/2018 6:53 PM &P

## 2018-04-25 NOTE — Progress Notes (Signed)
CRITICAL VALUE ALERT  Critical Value:  Troponin 0.75  Date & Time Notied:  04/25/2018 2245  Provider Notified: Dr. Cherlynn Perches  Orders Received/Actions taken: none

## 2018-04-25 NOTE — Progress Notes (Signed)
ANTICOAGULATION CONSULT NOTE - Initial Consult  Pharmacy Consult for heparin Indication: chest pain/ACS and atrial fibrillation  Allergies  Allergen Reactions  . Penicillins Anaphylaxis and Other (See Comments)    Has patient had a PCN reaction causing immediate rash, facial/tongue/throat swelling, SOB or lightheadedness with hypotension: Yes Has patient had a PCN reaction causing severe rash involving mucus membranes or skin necrosis: No Has patient had a PCN reaction that required hospitalization: No Has patient had a PCN reaction occurring within the last 10 years: No If all of the above answers are "NO", then may proceed with Cephalosporin use.   . Atorvastatin Other (See Comments)    Muscle pain  . Codeine Other (See Comments)    Nausea/Vomiting  . Colesevelam Other (See Comments)    Unsure of exact reaction type  . Ezetimibe Other (See Comments)    Pain in legs    Patient Measurements: Height: 5\' 4"  (162.6 cm) Weight: 115 lb 8.3 oz (52.4 kg) IBW/kg (Calculated) : 54.7 Heparin Dosing Weight: 52 kg  Vital Signs: Temp: 97.7 F (36.5 C) (09/24 1814) Temp Source: Oral (09/24 1814) BP: 165/73 (09/24 1814) Pulse Rate: 74 (09/24 1812)  Labs: No results for input(s): HGB, HCT, PLT, APTT, LABPROT, INR, HEPARINUNFRC, HEPRLOWMOCWT, CREATININE, CKTOTAL, CKMB, TROPONINI in the last 72 hours.  CrCl cannot be calculated (Patient's most recent lab result is older than the maximum 21 days allowed.).   Medical History: Past Medical History:  Diagnosis Date  . Atrial fibrillation ([place])   . Carotid artery stenosis with cerebral infarction ([place])   . Carotid atherosclerosis, bilateral   . Depression   . Diabetes mellitus ([place])   . Dyslipidemia   . Edema   . Fracture of femoral neck, right ([place])   . Hyperplastic colon polyp   . Hypertension   . Osteoporosis   . Peripheral vascular disease of extremity ([place])   . PSVT (paroxysmal supraventricular tachycardia) ([place])   .  Subclavian artery stenosis [facility])    Assessment: 84 year old female transferred from [place] with invasive cardiac workup for elevated cardiac enzymes. Patient with history of afib and appears to be on apixaban prior to admission with a last dose of 04/22/18 per medication history. Heparin started at OSH and to be continued here.   Last aptt was at noon this morning and was low at 43s. Heparin appears to have been started at 800 units/hr and is currently running at 850 units/hr. Will increase to 1000 units/hr now and check heparin level and aptt in am. Hgb in 9s this morning, plt wnl. Bmet wnl.   D-dimer elevated but CTA negative for PE.   Goal of Therapy:  Heparin level 0.3-0.7 units/ml Monitor platelets by anticoagulation protocol: Yes   Plan:  Increase heparin infusion to 1000 units/hr Check anti-Xa level in 8 hours and daily while on heparin Continue to monitor H&H and platelets  Erin Hearing PharmD., BCPS Clinical Pharmacist 04/25/2018 8:13 PM

## 2018-04-25 NOTE — Progress Notes (Signed)
RT NOTES: Patient transferred from [facility] by [place] on bipap. Patient placed on hospital v60 bipap 10/5 60% (settings received from [facility] transporters). BBS clear to auscultation. Patient is tachypneic with rate of 34, but breathing is unlabored. Will continue to monitor respiratory status and waiting on further orders.

## 2018-04-26 ENCOUNTER — Inpatient Hospital Stay (HOSPITAL_COMMUNITY): Payer: Medicare Other

## 2018-04-26 DIAGNOSIS — I481 Persistent atrial fibrillation: Secondary | ICD-10-CM

## 2018-04-26 DIAGNOSIS — R059 Cough, unspecified: Secondary | ICD-10-CM

## 2018-04-26 DIAGNOSIS — R05 Cough: Secondary | ICD-10-CM

## 2018-04-26 DIAGNOSIS — D649 Anemia, unspecified: Secondary | ICD-10-CM

## 2018-04-26 DIAGNOSIS — I361 Nonrheumatic tricuspid (valve) insufficiency: Secondary | ICD-10-CM

## 2018-04-26 LAB — APTT
aPTT: 62 seconds — ABNORMAL HIGH (ref 24–36)
aPTT: 81 seconds — ABNORMAL HIGH (ref 24–36)

## 2018-04-26 LAB — LACTIC ACID, PLASMA: LACTIC ACID, VENOUS: 1.4 mmol/L (ref 0.5–1.9)

## 2018-04-26 LAB — BASIC METABOLIC PANEL
Anion gap: 15 (ref 5–15)
BUN: 18 mg/dL (ref 8–23)
CHLORIDE: 97 mmol/L — AB (ref 98–111)
CO2: 25 mmol/L (ref 22–32)
CREATININE: 0.88 mg/dL (ref 0.44–1.00)
Calcium: 8.7 mg/dL — ABNORMAL LOW (ref 8.9–10.3)
GFR calc Af Amer: >60 mL/min (ref >60)
GFR calc non Af Amer: 60 mL/min — ABNORMAL LOW (ref >60)
GLUCOSE: 99 mg/dL (ref 70–99)
Potassium: 3.5 mmol/L (ref 3.5–5.1)
SODIUM: 137 mmol/L (ref 135–145)

## 2018-04-26 LAB — ECHOCARDIOGRAM COMPLETE
HEIGHTINCHES: 64 in
Weight: 1975.32 oz

## 2018-04-26 LAB — RESPIRATORY PANEL BY PCR
ADENOVIRUS-RVPPCR: NOT DETECTED
BORDETELLA PERTUSSIS-RVPCR: NOT DETECTED
CORONAVIRUS 229E-RVPPCR: NOT DETECTED
CORONAVIRUS HKU1-RVPPCR: NOT DETECTED
CORONAVIRUS NL63-RVPPCR: NOT DETECTED
CORONAVIRUS OC43-RVPPCR: NOT DETECTED
Chlamydophila pneumoniae: NOT DETECTED
Influenza A: NOT DETECTED
Influenza B: NOT DETECTED
METAPNEUMOVIRUS-RVPPCR: NOT DETECTED
Mycoplasma pneumoniae: NOT DETECTED
PARAINFLUENZA VIRUS 1-RVPPCR: NOT DETECTED
PARAINFLUENZA VIRUS 2-RVPPCR: NOT DETECTED
PARAINFLUENZA VIRUS 3-RVPPCR: NOT DETECTED
Parainfluenza Virus 4: NOT DETECTED
RHINOVIRUS / ENTEROVIRUS - RVPPCR: NOT DETECTED
Respiratory Syncytial Virus: NOT DETECTED

## 2018-04-26 LAB — TROPONIN I
TROPONIN I: 0.69 ng/mL — AB (ref <0.03)
Troponin I: 0.6 ng/mL (ref <0.03)

## 2018-04-26 LAB — CBC
HEMATOCRIT: 29.3 % — AB (ref 36.0–46.0)
HEMOGLOBIN: 8.8 g/dL — AB (ref 12.0–15.0)
MCH: 23.2 pg — ABNORMAL LOW (ref 26.0–34.0)
MCHC: 30 g/dL (ref 30.0–36.0)
MCV: 77.1 fL — ABNORMAL LOW (ref 78.0–100.0)
Platelets: 308 10*3/uL (ref 150–400)
RBC: 3.8 MIL/uL — AB (ref 3.87–5.11)
RDW: 17.4 % — ABNORMAL HIGH (ref 11.5–15.5)
WBC: 6.2 10*3/uL (ref 4.0–10.5)

## 2018-04-26 LAB — HEPARIN LEVEL (UNFRACTIONATED): HEPARIN UNFRACTIONATED: 1.64 [IU]/mL — AB (ref 0.30–0.70)

## 2018-04-26 MED ORDER — ASPIRIN 81 MG PO CHEW
81.0000 mg | CHEWABLE_TABLET | Freq: Every day | ORAL | Status: DC
  Administered 2018-04-26 – 2018-04-27 (×2): 81 mg via ORAL
  Filled 2018-04-26 (×3): qty 1

## 2018-04-26 NOTE — Progress Notes (Signed)
ANTICOAGULATION CONSULT NOTE - Follow Up Consult  Pharmacy Consult for heparin Indication: Afib and r/o ACS   Labs: Recent Labs    04/25/18 2040 04/26/18 0225 04/26/18 0536  HGB 9.2*  --   --   HCT 30.3*  --   --   PLT 334  --   --   APTT  --   --  62*  LABPROT 16.8*  --   --   INR 1.37  --   --   HEPARINUNFRC  --   --  1.64*  CREATININE 0.85 0.88  --   TROPONINI 0.75* 0.69*  --     Assessment: 83yo female subtherapeutic on heparin after rate change.  Goal of Therapy:  aPTT 66-102 seconds   Plan:  Will increase heparin gtt by 2 units/kg/hr to 1100 units/hr and check PTT in 8 hours.    Wynona Neat, PharmD, BCPS  04/26/2018,7:22 AM

## 2018-04-26 NOTE — Progress Notes (Signed)
[place] for heparin Indication: chest pain/ACS and atrial fibrillation  Allergies  Allergen Reactions  . Penicillins Anaphylaxis and Other (See Comments)    Has patient had a PCN reaction causing immediate rash, facial/tongue/throat swelling, SOB or lightheadedness with hypotension: Yes Has patient had a PCN reaction causing severe rash involving mucus membranes or skin necrosis: No Has patient had a PCN reaction that required hospitalization: No Has patient had a PCN reaction occurring within the last 10 years: No If all of the above answers are "NO", then may proceed with Cephalosporin use.   . Atorvastatin Other (See Comments)    Muscle pain  . Codeine Other (See Comments)    Nausea/Vomiting  . Colesevelam Other (See Comments)    Unsure of exact reaction type  . Ezetimibe Other (See Comments)    Pain in legs    Patient Measurements: Height: 5\' 4"  (162.6 cm) Weight: 123 lb 7.3 oz (56 kg) IBW/kg (Calculated) : 54.7 Heparin Dosing Weight: 52 kg  Vital Signs: Temp: 98.2 F (36.8 C) (09/25 1231) Temp Source: Oral (09/25 1231) BP: 130/60 (09/25 1231) Pulse Rate: 81 (09/25 1231)  Labs: Recent Labs    04/25/18 2040 04/26/18 0225 04/26/18 0536 04/26/18 0705 04/26/18 1706  HGB 9.2*  --   --  8.8*  --   HCT 30.3*  --   --  29.3*  --   PLT 334  --   --  308  --   APTT  --   --  62*  --  81*  LABPROT 16.8*  --   --   --   --   INR 1.37  --   --   --   --   HEPARINUNFRC  --   --  1.64*  --   --   CREATININE 0.85 0.88  --   --   --   TROPONINI 0.75* 0.69*  --  0.60*  --     Estimated Creatinine Clearance: 43.3 mL/min (by C-G formula based on SCr of 0.88 mg/dL).   Medical History: Past Medical History:  Diagnosis Date  . Atrial fibrillation ([place])   . Carotid artery stenosis with cerebral infarction ([place])   . Carotid atherosclerosis, bilateral   . Chronic bronchitis ([place])   . Depression   . Dyslipidemia   . Edema   .  History of blood transfusion 04/24/2018   "low HgB" (04/25/2018)  . Hyperplastic colon polyp   . Hypertension   . Hypothyroidism   . Osteoporosis   . Peripheral vascular disease of extremity ([place])   . PSVT (paroxysmal supraventricular tachycardia) ([place])   . Skin cancer    "scraped off my neck and legs" (04/25/2018)  . Subclavian artery stenosis [facility])    Assessment: 84 year old female transferred from [place] with invasive cardiac workup for elevated cardiac enzymes. Patient with history of afib and appears to be on apixaban prior to admission with a last dose of 04/22/18 per medication history. Heparin started at OSH and to be continued here.  -aPTT= 81 and at goal on 1000 units/hr  Goal of Therapy:  Heparin level 0.3-0.7 units/ml Monitor platelets by anticoagulation protocol: Yes   Plan:  -No heparin changes needed -Daily heparin level and CBC  Hildred Laser, PharmD Clinical Pharmacist Please check Amion for pharmacy contact number

## 2018-04-26 NOTE — Consult Note (Signed)
Medical Consultation   Brooke Mullins  ZOX:[id number]  DOB: 02/06/1934  DOA: 04/25/2018  PCP: Paulina Fusi, MD     Requesting physician: *Dr [facility]  Reason for consultation: Cough and Anemia   History of Present Illness: Brooke Mullins is an 84 y.o. female with a history of heart failure dyslipidemia hypertension peripheral vascular disease who presented here for CHF exacerbation and NSTEMI.  Patient had cough and chills prior to coming to the hospital.  She also was found to be anemic and received 1 unit of packed red blood cells at [place] before being transferred here.  Per daughter she had a negative Hemoccult stool there.  One is been ordered here and is pending.  Patient did have a fall a few months ago and had a facial pseudoaneurysm that bled quite a lot.  Hemoglobin was normal prior to this.  Patient denies any melena or hematochezia or hematuria.     Review of Systems: Denies melena hematochezia hematuria abdominal pain dysphasia positive for cough chills shortness of breath   Past Medical History:   Past Medical History:  Diagnosis Date  . Atrial fibrillation (HCC)   . Carotid artery stenosis with cerebral infarction (HCC)   . Carotid atherosclerosis, bilateral   . Chronic bronchitis (HCC)   . Depression   . Dyslipidemia   . Edema   . History of blood transfusion 04/24/2018   "low HgB" (04/25/2018)  . Hyperplastic colon polyp   . Hypertension   . Hypothyroidism   . Osteoporosis   . Peripheral vascular disease of extremity (HCC)   . PSVT (paroxysmal supraventricular tachycardia) (HCC)   . Skin cancer    "scraped off my neck and legs" (04/25/2018)  . Subclavian artery stenosis [facility])     Past Surgical History: Past Surgical History:  Procedure Laterality Date  . ABDOMINAL HYSTERECTOMY    . APPENDECTOMY    . CARDIOVERSION N/A 10/17/2017   Procedure: CARDIOVERSION;  Surgeon: Thurmon Fair, MD;  Location: [facility] ENDOSCOPY;   Service: Cardiovascular;  Laterality: N/A;  . CARDIOVERSION N/A 01/02/2018   Procedure: CARDIOVERSION;  Surgeon: Laurey Morale, MD;  Location[facility] ENDOSCOPY;  Service: Cardiovascular;  Laterality: N/A;  . CATARACT EXTRACTION W/ INTRAOCULAR LENS  IMPLANT, BILATERAL Bilateral   . COLONOSCOPY W/ BIOPSIES AND POLYPECTOMY    . FRACTURE SURGERY    . HIP FRACTURE SURGERY Left 09/2006  . ORIF ORBITAL FRACTURE  01/07/2018   ORBITOTOMY ANTERIOR WITH DRAINAGE OF ORBITAL HEMATOMA, ORIF ORBITAL FRACTURE,ORBITAL DECOMPRESSION/care everywhere notes     Allergies:   Allergies  Allergen Reactions  . Penicillins Anaphylaxis and Other (See Comments)    Has patient had a PCN reaction causing immediate rash, facial/tongue/throat swelling, SOB or lightheadedness with hypotension: Yes Has patient had a PCN reaction causing severe rash involving mucus membranes or skin necrosis: No Has patient had a PCN reaction that required hospitalization: No Has patient had a PCN reaction occurring within the last 10 years: No If all of the above answers are "NO", then may proceed with Cephalosporin use.   . Atorvastatin Other (See Comments)    Muscle pain  . Codeine Other (See Comments)    Nausea/Vomiting  . Colesevelam Other (See Comments)    Unsure of exact reaction type  . Ezetimibe Other (See Comments)    Pain in legs     Social History:  reports that she has quit smoking.  Her smoking use included cigarettes. She has a 30.00 pack-year smoking history. She has never used smokeless tobacco. She reports that she does not drink alcohol or use drugs.   Family History: Family History  Problem Relation Age of Onset  . Heart failure Mother   . COPD Father   . Hypertension Brother   . Cancer Brother        Physical Exam: Vitals:   04/25/18 1812 04/25/18 1814 04/25/18 2022 04/26/18 0008  BP:  (!) 165/73 (!) 179/57   Pulse: 74  77   Resp: (!) 34 (!) 23    Temp:  97.7 F (36.5 C) 97.7 F (36.5 C) 97.8  F (36.6 C)  TempSrc:  Oral Tympanic Oral  SpO2: 98% 100%    Weight:  52.4 kg    Height:  5\' 4"  (1.626 m)      Constitutional: , appears generally weak but no acute distress alert and awake, oriented x3, not in any acute distress. Eyes:, EOMI, irises appear normal, anicteric sclera,  ENMT: external ears and nose appear normal,            Lips appears normal, oropharynx mucosa, tongue, posterior pharynx appear normal  CVS: Regular rhythm , + murmur + LE edema, palpable pedal pulses  Respiratory: Few scattered rales  Respiratory effort normal. No accessory muscle use.  Abdomen: soft nontender, nondistended, normal bowel sounds, no hepatosplenomegaly, no hernias   Data reviewed:  I have personally reviewed following labs and imaging studies Labs:  CBC: Recent Labs  Lab 04/25/18 2040  WBC 7.8  NEUTROABS 5.6  HGB 9.2*  HCT 30.3*  MCV 77.3*  PLT 334    Basic Metabolic Panel: Recent Labs  Lab 04/25/18 2040  NA 137  K 3.8  CL 100  CO2 23  GLUCOSE 89  BUN 17  CREATININE 0.85  CALCIUM 8.8*  MG 2.0   GFR Estimated Creatinine Clearance: 42.9 mL/min (by C-G formula based on SCr of 0.85 mg/dL). Liver Function Tests: Recent Labs  Lab 04/25/18 2040  AST 60*  ALT 45*  ALKPHOS 144*  BILITOT 0.6  PROT 6.6  ALBUMIN 2.7*   No results for input(s): LIPASE, AMYLASE in the last 168 hours. No results for input(s): AMMONIA in the last 168 hours. Coagulation profile Recent Labs  Lab 04/25/18 2040  INR 1.37    Cardiac Enzymes: Recent Labs  Lab 04/25/18 2040  TROPONINI 0.75*   BNP: Invalid input(s): POCBNP CBG: No results for input(s): GLUCAP in the last 168 hours. D-Dimer No results for input(s): DDIMER in the last 72 hours. Hgb A1c No results for input(s): HGBA1C in the last 72 hours. Lipid Profile No results for input(s): CHOL, HDL, LDLCALC, TRIG, CHOLHDL, LDLDIRECT in the last 72 hours. Thyroid function studies Recent Labs    04/25/18 2040  TSH 6.142*    Anemia work up No results for input(s): VITAMINB12, FOLATE, FERRITIN, TIBC, IRON, RETICCTPCT in the last 72 hours. Urinalysis No results found for: COLORURINE, APPEARANCEUR, LABSPEC, PHURINE, GLUCOSEU, HGBUR, BILIRUBINUR, KETONESUR, PROTEINUR, UROBILINOGEN, NITRITE, LEUKOCYTESUR   Microbiology Recent Results (from the past 240 hour(s))  MRSA PCR Screening     Status: None   Collection Time: 04/25/18 10:00 PM  Result Value Ref Range Status   MRSA by PCR NEGATIVE NEGATIVE Final    Comment:        The GeneXpert MRSA Assay (FDA approved for NASAL specimens only), is one component of a comprehensive MRSA colonization surveillance program. It is not intended to  diagnose MRSA infection nor to guide or monitor treatment for MRSA infections. Performed at [facility], [address], [place], [place] [address]        Inpatient Medications:   Scheduled Meds: . amiodarone  200 mg Oral Daily  . escitalopram  20 mg Oral QHS  . feeding supplement (ENSURE ENLIVE)  237 mL Oral BID BM  . furosemide  60 mg Intravenous Daily  . Influenza vac split quadrivalent PF  0.5 mL Intramuscular Tomorrow-1000  . mouth rinse  15 mL Mouth Rinse BID  . nitroGLYCERIN  0.5 inch Topical Q6H  . pantoprazole  40 mg Oral Daily  . sodium chloride flush  3 mL Intravenous Q12H   Continuous Infusions: . sodium chloride 250 mL (04/25/18 2044)  . heparin 1,000 Units/hr (04/25/18 2118)     Radiological Exams on Admission: Dg Chest Port 1 View  Result Date: 04/25/2018 CLINICAL DATA:  84 year old female with shortness of breath. EXAM: PORTABLE CHEST 1 VIEW COMPARISON:  Chest CT dated 04/24/2018 FINDINGS: Diffuse chronic interstitial coarsening. Bilateral mid to lower lung field hazy densities noted which may be chronic or represent edema or inflammatory/infectious etiology. No focal consolidation, pleural effusion, or pneumothorax. There is mild cardiomegaly. Atherosclerotic calcification of the aortic  arch. Osteopenia with degenerative changes of the spine and midthoracic vertebroplasty. No acute osseous pathology. IMPRESSION: 1. No new consolidation. 2. Chronic parenchymal disease. Bilateral lower lung field hazy densities may be chronic or represent edema or inflammatory/infectious etiology. Electronically Signed   By: Elgie Collard M.D.   On: 04/25/2018 23:56    Impression/Recommendations Active Problems:   Acute diastolic heart failure (HCC)   Cough   Anemia  -Do not think patient has pneumonia at this time.  Objective evidence does not support this.  Unremarkable lactic acid ,procalcitonin, and no leukocytosis.  Chest x-ray shows no focal infiltrates.  No antibiotic at this time, follow. -Suspect patient's blood loss was from her facial pseudoaneurysm a few months back.  Patient has received blood transfusion so unable to take iron studies at this time.  Will check Hemoccult stools. patient will need follow-up and  iron studies as outpatient will follow for now   Thank you for this consultation.  Our [facility] hospitalist team will follow the patient with you.   Time Spent: 25 min  Fallon Howerter Johnson-Pitts M.D. [facility] 04/26/2018, 1:11 AM

## 2018-04-26 NOTE — Progress Notes (Signed)
  Echocardiogram 2D Echocardiogram has been performed.  Bobbye Charleston 04/26/2018, 1:56 PM

## 2018-04-26 NOTE — Progress Notes (Addendum)
**Note Brooke-Identified via Obfuscation** Progress Note  Patient Name: Brooke Mullins Date of Encounter: 04/26/2018  Primary Cardiologist: Kraswoski/Camnitz   Subjective   Pt feeling well today. Currently off Bipap and ventilating well. Denies chest pain or palpitations.   Inpatient Medications    Scheduled Meds: . amiodarone  200 mg Oral Daily  . aspirin  81 mg Oral Daily  . escitalopram  20 mg Oral QHS  . feeding supplement (ENSURE ENLIVE)  237 mL Oral BID BM  . furosemide  60 mg Intravenous Daily  . Influenza vac split quadrivalent PF  0.5 mL Intramuscular Tomorrow-1000  . mouth rinse  15 mL Mouth Rinse BID  . nitroGLYCERIN  0.5 inch Topical Q6H  . pantoprazole  40 mg Oral Daily  . sodium chloride flush  3 mL Intravenous Q12H   Continuous Infusions: . sodium chloride 10 mL/hr at 04/26/18 0400  . heparin 1,100 Units/hr (04/26/18 0806)   PRN Meds: sodium chloride, acetaminophen, ondansetron (ZOFRAN) IV, sodium chloride flush   Vital Signs    Vitals:   04/26/18 0502 04/26/18 0804 04/26/18 0811 04/26/18 1128  BP: (!) 146/68 (!) 147/77 (!) 147/77   Pulse: 76 72 71   Resp:  20 (!) 25   Temp: 97.6 F (36.4 C)     TempSrc: Oral     SpO2: 98% 99% 99% 97%  Weight: 56 kg     Height:        Intake/Output Summary (Last 24 hours) at 04/26/2018 1158 Last data filed at 04/26/2018 0445 Gross per 24 hour  Intake 54 ml  Output 1700 ml  Net -1646 ml   Filed Weights   04/25/18 1814 04/26/18 0502  Weight: 52.4 kg 56 kg    Physical Exam   General: Elderly, NAD Skin: Warm, dry, intact  Head: Normocephalic, atraumatic, clear, moist mucus membranes. Neck: Negative for carotid bruits. No JVD Lungs: Bilateral lower lobe crackles. No wheezes, rales, or rhonchi. Breathing is unlabored. Cardiovascular: RRR with S1 S2. + murmur. No rubs, gallops, or LV heave appreciated. Abdomen: Soft, non-tender, non-distended with normoactive bowel sounds. No obvious abdominal masses. MSK: Strength and tone appear normal for age.  5/5 in all extremities Extremities: No edema. No clubbing or cyanosis. DP/PT pulses 2+ bilaterally Neuro: Alert and oriented. No focal deficits. No facial asymmetry. MAE spontaneously. Psych: Responds to questions appropriately with normal affect.    Labs    Chemistry Recent Labs  Lab 04/25/18 2040 04/26/18 0225  NA 137 137  K 3.8 3.5  CL 100 97*  CO2 23 25  GLUCOSE 89 99  BUN 17 18  CREATININE 0.85 0.88  CALCIUM 8.8* 8.7*  PROT 6.6  --   ALBUMIN 2.7*  --   AST 60*  --   ALT 45*  --   ALKPHOS 144*  --   BILITOT 0.6  --   GFRNONAA >60 60*  GFRAA >60 >60  ANIONGAP 14 15     Hematology Recent Labs  Lab 04/25/18 2040 04/26/18 0705  WBC 7.8 6.2  RBC 3.92 3.80*  HGB 9.2* 8.8*  HCT 30.3* 29.3*  MCV 77.3* 77.1*  MCH 23.5* 23.2*  MCHC 30.4 30.0  RDW 17.7* 17.4*  PLT 334 308   Cardiac Enzymes Recent Labs  Lab 04/25/18 2040 04/26/18 0225 04/26/18 0705  TROPONINI 0.75* 0.69* 0.60*   No results for input(s): TROPIPOC in the last 168 hours.   BNP Recent Labs  Lab 04/25/18 2040  BNP 398.4*     DDimer No  results for input(s): DDIMER in the last 168 hours.   Radiology    Dg Chest Port 1 View  Result Date: 04/25/2018 CLINICAL DATA:  84 year old female with shortness of breath. EXAM: PORTABLE CHEST 1 VIEW COMPARISON:  Chest CT dated 04/24/2018 FINDINGS: Diffuse chronic interstitial coarsening. Bilateral mid to lower lung field hazy densities noted which may be chronic or represent edema or inflammatory/infectious etiology. No focal consolidation, pleural effusion, or pneumothorax. There is mild cardiomegaly. Atherosclerotic calcification of the aortic arch. Osteopenia with degenerative changes of the spine and midthoracic vertebroplasty. No acute osseous pathology. IMPRESSION: 1. No new consolidation. 2. Chronic parenchymal disease. Bilateral lower lung field hazy densities may be chronic or represent edema or inflammatory/infectious etiology. Electronically Signed    By: Anner Crete M.D.   On: 04/25/2018 23:56   Telemetry    04/26/18 Atrial fibrillation HR 70's   - Personally Reviewed  ECG    No new tracing as of 04/26/18 - Personally Reviewed  Cardiac Studies   Stress test: 09/05/17 Negative stress test 09/05/17  Echocardiogram: Pending this admission   Patient Profile     84 y.o. female with a hx of persistent afib, chronic LBBB, carotid stensis, DM2, HL, PSVT, HTN. History of fall with bleeding of right eye and vision loss. History of right maxillary artery embolization recently at [facility] 01/2018. Admitted to [facility] with SOB>>transferred to [facility] for further evaluation.   Assessment & Plan    1.  Hypoxia: -CXR with bilateral lower lung field hazy densities may be chronic or represent edema or inflammatory/infectious etiology upon presentation along with elevated BNP at 398 on admission  -More recent echo with pending results -Last echo with normal LVEF and G1DD>>>fluid volume overload could be in the setting of acute diastolic dysfucntion  -Given IV Lasix 40mg  with good diuresis -Weight, 123lb, 115lb on admission   -I&O, net negative 2.4L  -Continue IV Lasix for now and monitor fluid volume closely  -Currently of Bipap and ventilating well>>[place] 2L  -Will add diet   2.  Elevated troponin: -Trop, 0.69>0.60 without chest pain or ACS symptoms>>likely in the setting of demand ischemia  -Continue IV Hep gtt and monitor CE's>>if continues to down trend, will d/c and change to Eliquis   3.  Anemia: -Pt found to be anemic prior to transfer to [facility]>>received 1 unit PRBC -CBC with Hb 8.8 this admission -No outward s/s of acute bleeding -Will follow with daily CBC  4. Persistent atrial fibrillation: -Remains in AF with rate control -Eliquis currently on hold>>Hep gtt in place -Will add Eliquis tomorrow if down trending CE's   Signed, Kathyrn Drown NP-C [facility] Pager: [phone] 04/26/2018, 11:58 AM     For  questions or updates, please contact   Please consult [email or web address] for contact info under Cardiology/STEMI.  Personally seen and examined. Agree with above.  Starting to feel better, less short of breath but no chest pain.  Off BiPAP, now very dry.  Extensive note reviewed from outside hospital, history and physical.  GEN: Well nourished, well developed, in no acute distress, elderly HEENT: normal unilateral eye blindness Neck: no JVD, carotid bruits, or masses Cardiac: RRR; no murmurs, rubs, or gallops,no edema  Respiratory:  clear to auscultation bilaterally, normal work of breathing GI: soft, nontender, nondistended, + BS MS: no deformity or atrophy  Skin: warm and dry, no rash Neuro:  Alert and Oriented x 3, Strength and sensation are intact, tremor noted  Acute diastolic heart failure - Continue with IV  diuresis.  Feeling better.  See above for further details.  BiPAP has stopped.  Okay to eat.  Appreciate internal medicine consult, they do not believe that this is pneumonia.  Elevated troponin -Likely demand ischemia, type II MI in the setting of anemia, possible underlying coronary artery disease in the setting of hypoxic respiratory failure/diastolic heart failure.  She is not having any chest discomfort.  Back in March she had a nuclear stress test which was low risk, no ischemia at [place].  For now, continue with aggressive medical management.  One could consider a catheterization at a later date however she has had several risks for potential complications.  Persistent atrial fibrillation - On heparin IV currently.  Tomorrow, we will likely change back to Eliquis as cardiac markers are trending downward.  Anemia -She was transfused 1 unit at [facility].  FOBT by internal medicine team.  Candee Furbish, MD

## 2018-04-27 DIAGNOSIS — J9621 Acute and chronic respiratory failure with hypoxia: Secondary | ICD-10-CM

## 2018-04-27 LAB — RETICULOCYTES
RBC.: 4.08 MIL/uL (ref 3.87–5.11)
RETIC COUNT ABSOLUTE: 73.4 10*3/uL (ref 19.0–186.0)
RETIC CT PCT: 1.8 % (ref 0.4–3.1)

## 2018-04-27 LAB — CBC
HCT: 28.5 % — ABNORMAL LOW (ref 36.0–46.0)
HEMOGLOBIN: 8.8 g/dL — AB (ref 12.0–15.0)
MCH: 23.5 pg — AB (ref 26.0–34.0)
MCHC: 30.9 g/dL (ref 30.0–36.0)
MCV: 76.2 fL — AB (ref 78.0–100.0)
Platelets: 330 10*3/uL (ref 150–400)
RBC: 3.74 MIL/uL — AB (ref 3.87–5.11)
RDW: 17.6 % — ABNORMAL HIGH (ref 11.5–15.5)
WBC: 7.1 10*3/uL (ref 4.0–10.5)

## 2018-04-27 LAB — BASIC METABOLIC PANEL
ANION GAP: 9 (ref 5–15)
BUN: 25 mg/dL — AB (ref 8–23)
CO2: 27 mmol/L (ref 22–32)
Calcium: 8.6 mg/dL — ABNORMAL LOW (ref 8.9–10.3)
Chloride: 97 mmol/L — ABNORMAL LOW (ref 98–111)
Creatinine, Ser: 0.83 mg/dL (ref 0.44–1.00)
GFR calc Af Amer: >60 mL/min (ref >60)
GLUCOSE: 130 mg/dL — AB (ref 70–99)
POTASSIUM: 3.5 mmol/L (ref 3.5–5.1)
Sodium: 133 mmol/L — ABNORMAL LOW (ref 135–145)

## 2018-04-27 LAB — IRON AND TIBC
IRON: 26 ug/dL — AB (ref 28–170)
SATURATION RATIOS: 9 % — AB (ref 10.4–31.8)
TIBC: 294 ug/dL (ref 250–450)
UIBC: 268 ug/dL

## 2018-04-27 LAB — HEPARIN LEVEL (UNFRACTIONATED): Heparin Unfractionated: 1.02 IU/mL — ABNORMAL HIGH (ref 0.30–0.70)

## 2018-04-27 LAB — FERRITIN: FERRITIN: 137 ng/mL (ref 11–307)

## 2018-04-27 LAB — VITAMIN B12: Vitamin B-12: 798 pg/mL (ref 180–914)

## 2018-04-27 LAB — APTT: APTT: 81 s — AB (ref 24–36)

## 2018-04-27 LAB — FOLATE: Folate: 17.5 ng/mL (ref >5.9)

## 2018-04-27 MED ORDER — APIXABAN 5 MG PO TABS: 5.0000 mg | ORAL_TABLET | Freq: Two times a day (BID) | ORAL | Status: DC

## 2018-04-27 MED ORDER — POLYSACCHARIDE IRON COMPLEX 150 MG PO CAPS
150.0000 mg | ORAL_CAPSULE | Freq: Every day | ORAL | Status: DC
  Administered 2018-04-27 – 2018-04-28 (×2): 150 mg via ORAL
  Filled 2018-04-27 (×2): qty 1

## 2018-04-27 MED ORDER — APIXABAN 2.5 MG PO TABS
2.5000 mg | ORAL_TABLET | Freq: Two times a day (BID) | ORAL | Status: DC
  Administered 2018-04-27 – 2018-04-28 (×3): 2.5 mg via ORAL
  Filled 2018-04-27 (×3): qty 1

## 2018-04-27 NOTE — Progress Notes (Signed)
[place] for heparin>>apixaban Indication: chest pain/ACS and atrial fibrillation  Allergies  Allergen Reactions  . Penicillins Anaphylaxis and Other (See Comments)    Has patient had a PCN reaction causing immediate rash, facial/tongue/throat swelling, SOB or lightheadedness with hypotension: Yes Has patient had a PCN reaction causing severe rash involving mucus membranes or skin necrosis: No Has patient had a PCN reaction that required hospitalization: No Has patient had a PCN reaction occurring within the last 10 years: No If all of the above answers are "NO", then may proceed with Cephalosporin use.   . Atorvastatin Other (See Comments)    Muscle pain  . Codeine Other (See Comments)    Nausea/Vomiting  . Colesevelam Other (See Comments)    Unsure of exact reaction type  . Ezetimibe Other (See Comments)    Pain in legs    Patient Measurements: Height: 5\' 4"  (162.6 cm) Weight: 134 lb 9.6 oz (61.1 kg) IBW/kg (Calculated) : 54.7 Heparin Dosing Weight: 52 kg  Vital Signs: Temp: 98 F (36.7 C) (09/26 1144) Temp Source: Oral (09/26 1144) BP: 110/58 (09/26 1144) Pulse Rate: 83 (09/26 1144)  Labs: Recent Labs    04/25/18 2040 04/26/18 0225 04/26/18 0536 04/26/18 0705 04/26/18 1706 04/27/18 0319  HGB 9.2*  --   --  8.8*  --  8.8*  HCT 30.3*  --   --  29.3*  --  28.5*  PLT 334  --   --  308  --  330  APTT  --   --  62*  --  81* 81*  LABPROT 16.8*  --   --   --   --   --   INR 1.37  --   --   --   --   --   HEPARINUNFRC  --   --  1.64*  --   --  1.02*  CREATININE 0.85 0.88  --   --   --  0.83  TROPONINI 0.75* 0.69*  --  0.60*  --   --     Estimated Creatinine Clearance: 45.9 mL/min (by C-G formula based on SCr of 0.83 mg/dL).  Assessment: 84 year old female transferred from [place] with invasive cardiac workup for elevated cardiac enzymes. Patient with history of afib and appears to be on apixaban prior to admission with a  last dose of 04/22/18 per medication history. Heparin started at OSH and to be continued here.    Transition back apixaban today. Will use reduced dose apixaban with age >63 yo and wt in the 50-60kg range on admit. Renal function is normal.   Erin Hearing PharmD., BCPS Clinical Pharmacist 04/27/2018 12:26 PM    Please check AMION for all [facility] numbers  04/27/2018 12:25 PM

## 2018-04-27 NOTE — Progress Notes (Signed)
[facility] Consult  Leata A Geerdes 84 year old female with medical history of heart failure, hypertension, PVD who was admitted to the cardiology service for CHF exacerbation and NSTEMI.  Patient reported cough and chills prior to coming to the hospital was found to be anemic and received 1 unit of PRBCs at [facility] before transfer here.  Internal medicine was consulted for hypoxia and anemia.  S: Patient seen and examined, her saturations remain at 99 to 100% on current oxygen supplementation.  Upon me entering in the room patient was on 6 L high flow nasal cannula.  I decreased her oxygen to 3.5 L during my evaluation patient after speaking with the patient for about 10 to 15 minutes patient remained with saturation of 97%.  He denies chest pain, shortness of breath or palpitations.  Exam: Blood pressure (!) 102/57, pulse 92, temperature 98 F (36.7 C), temperature source Oral, resp. rate (!) 21, height 5\' 4"  (1.626 m), weight 61.1 kg, SpO2 96 %.  General: Pt is alert, awake, not in acute distress Cardiovascular: RRR, S1/S2 +, no rubs, no gallops Respiratory: Decreased breath sounds bilaterally, bibasilar crackles Abdominal: Soft, NT, ND, bowel sounds + Extremities: Right lower extremity trace edema.   A&P Acute respiratory failure with hypoxia secondary to cardiomyopathy. Chest x-ray is more consistent with fluid overload, unremarkable lactic acid, negative procalcitonin and no leukocytosis argue against infectious process at this time.  Patient weaning of oxygen well.  Recommend to continue to decrease O2 as able, check pulse ox with ambulation.  Out of bed as tolerated continue diuresis.  Management per primary team.  Iron deficiency anemia Patient received 1 unit PRBC, FOBT was negative at [facility].  Start iron supplement. Hgb stable, no signs of overt bleeding.   PAF/NSTEMI Per primary - cardiology   Labs, images and chart was reviewed, case discussed with  patient and family members.   At this point patient seen medically stable and improving, cardiology managing heart issues.  Internal medicine will sign off.  Please call back if further help needed  Chipper Oman, MD

## 2018-04-27 NOTE — Progress Notes (Addendum)
**Note Brooke-Identified via Obfuscation** Progress Note  Patient Name: Brooke Mullins Date of Encounter: 04/27/2018  Primary Cardiologist: Kraswoski/Camnitz   Subjective   Pt feeling well today. Breathing improved. Will d/c heparin and start Eliquis. Echocardiogram withLVEF 65-70%, severe concentric LV wall thickening - speckled intraventricular septum suggestive of infiltrative cardiomyopathy such as amyloidosis. Consider PYP nuclear scan.   Inpatient Medications    Scheduled Meds: . amiodarone  200 mg Oral Daily  . aspirin  81 mg Oral Daily  . escitalopram  20 mg Oral QHS  . feeding supplement (ENSURE ENLIVE)  237 mL Oral BID BM  . furosemide  60 mg Intravenous Daily  . Influenza vac split quadrivalent PF  0.5 mL Intramuscular Tomorrow-1000  . mouth rinse  15 mL Mouth Rinse BID  . nitroGLYCERIN  0.5 inch Topical Q6H  . pantoprazole  40 mg Oral Daily  . sodium chloride flush  3 mL Intravenous Q12H   Continuous Infusions: . sodium chloride 250 mL (04/26/18 1718)  . heparin 1,100 Units/hr (04/26/18 1717)   PRN Meds: sodium chloride, acetaminophen, ondansetron (ZOFRAN) IV, sodium chloride flush   Vital Signs    Vitals:   04/27/18 0300 04/27/18 0421 04/27/18 0749 04/27/18 0753  BP:  117/66 (!) 127/105 (!) 118/100  Pulse:  80    Resp:  20 20   Temp:  98.5 F (36.9 C) 98 F (36.7 C)   TempSrc:  Oral Oral   SpO2: 98%  100%   Weight:  61.1 kg    Height:        Intake/Output Summary (Last 24 hours) at 04/27/2018 1139 Last data filed at 04/27/2018 0954 Gross per 24 hour  Intake 363 ml  Output 1500 ml  Net -1137 ml   Filed Weights   04/25/18 1814 04/26/18 0502 04/27/18 0421  Weight: 52.4 kg 56 kg 61.1 kg   Physical Exam   General: Elderly, NAD Skin: Warm, dry, intact  Head: Normocephalic, atraumatic, clear, moist mucus membranes. Neck: Negative for carotid bruits. No JVD Lungs:Clear to ausculation bilaterally. No wheezes, rales, or rhonchi. Breathing is unlabored. Cardiovascular: Irregularly  irregular with S1 S2. No murmurs, rubs, gallops, or LV heave appreciated. Abdomen: Soft, non-tender, non-distended with normoactive bowel sounds. No obvious abdominal masses. MSK: Strength and tone appear normal for age. 5/5 in all extremities Extremities: No edema. No clubbing or cyanosis. DP/PT pulses 2+ bilaterally Neuro: Alert and oriented. No focal deficits. No facial asymmetry. MAE spontaneously. Psych: Responds to questions appropriately with normal affect.    Labs    Chemistry Recent Labs  Lab 04/25/18 2040 04/26/18 0225 04/27/18 0319  NA 137 137 133*  K 3.8 3.5 3.5  CL 100 97* 97*  CO2 _0 GLUCOSE 89 99 130*  BUN 17 18 25*  CREATININE 0.85 0.88 0.83  CALCIUM 8.8* 8.7* 8.6*  PROT 6.6  --   --   ALBUMIN 2.7*  --   --   AST 60*  --   --   ALT 45*  --   --   ALKPHOS 144*  --   --   BILITOT 0.6  --   --   GFRNONAA >60 60* >60  GFRAA >60 >60 >60  ANIONGAP _1 Hematology Recent Labs  Lab 04/25/18 2040 04/26/18 0705 04/27/18 0319 04/27/18 0931  WBC 7.8 6.2 7.1  --   RBC 3.92 3.80* 3.74* 4.08  HGB 9.2* 8.8* 8.8*  --   HCT 30.3* 29.3* 28.5*  --  MCV 77.3* 77.1* 76.2*  --   MCH 23.5* 23.2* 23.5*  --   MCHC 30.4 30.0 30.9  --   RDW 17.7* 17.4* 17.6*  --   PLT 334 308 330  --    Cardiac Enzymes Recent Labs  Lab 04/25/18 2040 04/26/18 0225 04/26/18 0705  TROPONINI 0.75* 0.69* 0.60*   No results for input(s): TROPIPOC in the last 168 hours.   BNP Recent Labs  Lab 04/25/18 2040  BNP 398.4*    DDimer No results for input(s): DDIMER in the last 168 hours.   Radiology    Dg Chest Port 1 View  Result Date: 04/25/2018 CLINICAL DATA:  84 year old female with shortness of breath. EXAM: PORTABLE CHEST 1 VIEW COMPARISON:  Chest CT dated 04/24/2018 FINDINGS: Diffuse chronic interstitial coarsening. Bilateral mid to lower lung field hazy densities noted which may be chronic or represent edema or inflammatory/infectious etiology. No focal  consolidation, pleural effusion, or pneumothorax. There is mild cardiomegaly. Atherosclerotic calcification of the aortic arch. Osteopenia with degenerative changes of the spine and midthoracic vertebroplasty. No acute osseous pathology. IMPRESSION: 1. No new consolidation. 2. Chronic parenchymal disease. Bilateral lower lung field hazy densities may be chronic or represent edema or inflammatory/infectious etiology. Electronically Signed   By: Anner Crete M.D.   On: 04/25/2018 23:56   Telemetry    04/27/18 Atrial fibrillation HR 90-100's - Personally Reviewed  ECG    No new tracing as of 04/27/18- Personally Reviewed  Cardiac Studies   Stress test: 09/05/17 Negative stress test 09/05/17  Echocardiogram: 04/26/18 Study Conclusions  - Left ventricle: The cavity size was normal. There was severe   concentric hypertrophy. Systolic function was vigorous. The   estimated ejection fraction was in the range of 65% to 70%. Wall   motion was normal; there were no regional wall motion   abnormalities. The study is not technically sufficient to allow   evaluation of LV diastolic function. - Aortic valve: Sclerosis without stenosis. There was trivial   regurgitation. - Mitral valve: Calcified annulus. Mildly thickened leaflets .   There was trivial regurgitation. - Left atrium: Moderately dilated. - Right ventricle: The cavity size was mildly dilated. Mildly   reduced systolic function. - Tricuspid valve: There was moderate regurgitation. - Pulmonary arteries: PA peak pressure: 59 mm Hg (S). - Inferior vena cava: The vessel was normal in size. The   respirophasic diameter changes were in the normal range (>= 50%),   consistent with normal central venous pressure.  Impressions:  - LVEF 65-70%, severe concentric LV wall thickening - speckled   intraventricular septum suggestive of infiltrative cardiomyopathy   such as amyloidosis. Consider PYP nuclear scan. Grossly normal   wall  motion, aortic sclerosis with trivial AI, MAC with trivial   MR, moderate LAE, mildly dilated RV with reduced systolic   function, moderate TR, RVSP 59 mmHg, normal IVC.  Patient Profile     84 y.o. female with a hx of persistent afib, chronic LBBB, carotid stensis, DM2, HL, PSVT, HTN. History of fall with bleeding of right eye and vision loss. History of right maxillary artery embolization recently at [facility] 01/2018. Admitted to [facility] with SOB>>transferred to [facility] for further evaluation.   Assessment & Plan    1.  Hypoxia>>improving: -CXR with bilateral lower lung field hazy densities may be chronic or represent edema or inflammatory/infectious etiology upon presentation along with elevated BNP at 398 on admission  -Last echo with normal LVEF and G1DD>>>fluid volume overload could  be in the setting of acute diastolic dysfucntion  -Echocardiogram from 04/26/18 with LVEF 65-70%, severe concentric LV wall thickening - speckled intraventricular septum suggestive of infiltrative cardiomyopathy such as amyloidosis. Consider PYP nuclear scan. Grossly normal wall motion, aortic sclerosis with trivial AI, MAC with trivial MR, moderate LAE, mildly dilated RV with reduced systolic function, moderate TR, RVSP 59 mmHg, normal IVC -Given IV Lasix 39m with good diuresis -Weight, 134lb, 115lb on admission   -I&O, net negative 2.7L since admission  -Continue IV Lasix for now and monitor fluid volume closely>>creatinine stable   2.  Elevated troponin: -Trop, 0.69>0.60 without chest pain or ACS symptoms>>likely in the setting of demand ischemia  -Stop IV Hep gtt and change to Eliquis 560mPo twice daily    3.  Anemia: -Pt found to be anemic prior to transfer to [facility]>>received 1 unit PRBC -CBC with Hb 8.8 today  -No outward s/s of acute bleeding -Will follow with daily CBC  4. Persistent atrial fibrillation: -Remains in AF with rate control -Will stop Hep gtt and restart Eliquis  today  Signed, JiKathyrn DrownP-C [facility] Pager: [phone]/26/2019, 11:39 AM     For questions or updates, please contact   Please consult [email or web address] for contact info under Cardiology/STEMI.  Personally seen and examined. Agree with above. Feeling little bit better.  Asking when home is insight. Still short of breath but improved.  No chest pain.  GEN: Elderly, thin, resting tremor, no significant distress HEENT: normal  Neck: no JVD, carotid bruits, or masses Cardiac: Irregularly irregular; no murmurs, rubs, or gallops,no edema  Respiratory: Crackles heard bilaterally, fibrotic-like  GI: soft, nontender, nondistended, + BS MS: no deformity or atrophy  Skin: warm and dry, no rash Neuro:  Alert and Oriented x 3, Strength and sensation are intact Psych: euthymic mood, full affect   Echocardiogram with thickening of the interventricular septum, consider PYP scan reported.  Assessment and plan:  Hypoxia - Continuing the diuresis.  Creatinine stable.  BUN slightly elevated today.  Watch for increases.  Continue with IV Lasix.  Bed weight is inaccurate.  Permanent atrial fibrillation -Continue with rate control.  Mildly elevated heart rate.  Chronic anticoagulation - Agree with restarting Eliquis.  Elevated troponin -Likely demand ischemia in the setting of her underlying illness.  Secondary pulmonary hypertension -Likely underlying lung pathology as well as elevated left heart pressures.  In regards to consideration of PYP scan looking for amyloidosis, given her advanced age, I will defer discussion with her primary cardiologist for risks versus benefits.  Hopeful discharge tomorrow if stable.  MaCandee FurbishMD

## 2018-04-27 NOTE — Progress Notes (Signed)
[place] for heparin Indication: chest pain/ACS and atrial fibrillation  Allergies  Allergen Reactions  . Penicillins Anaphylaxis and Other (See Comments)    Has patient had a PCN reaction causing immediate rash, facial/tongue/throat swelling, SOB or lightheadedness with hypotension: Yes Has patient had a PCN reaction causing severe rash involving mucus membranes or skin necrosis: No Has patient had a PCN reaction that required hospitalization: No Has patient had a PCN reaction occurring within the last 10 years: No If all of the above answers are "NO", then may proceed with Cephalosporin use.   . Atorvastatin Other (See Comments)    Muscle pain  . Codeine Other (See Comments)    Nausea/Vomiting  . Colesevelam Other (See Comments)    Unsure of exact reaction type  . Ezetimibe Other (See Comments)    Pain in legs    Patient Measurements: Height: 5\' 4"  (162.6 cm) Weight: 134 lb 9.6 oz (61.1 kg) IBW/kg (Calculated) : 54.7 Heparin Dosing Weight: 52 kg  Vital Signs: Temp: 98 F (36.7 C) (09/26 0749) Temp Source: Oral (09/26 0749) BP: 118/100 (09/26 0753) Pulse Rate: 80 (09/26 0421)  Labs: Recent Labs    04/25/18 2040 04/26/18 0225 04/26/18 0536 04/26/18 0705 04/26/18 1706 04/27/18 0319  HGB 9.2*  --   --  8.8*  --  8.8*  HCT 30.3*  --   --  29.3*  --  28.5*  PLT 334  --   --  308  --  330  APTT  --   --  62*  --  81* 81*  LABPROT 16.8*  --   --   --   --   --   INR 1.37  --   --   --   --   --   HEPARINUNFRC  --   --  1.64*  --   --  1.02*  CREATININE 0.85 0.88  --   --   --  0.83  TROPONINI 0.75* 0.69*  --  0.60*  --   --     Estimated Creatinine Clearance: 45.9 mL/min (by C-G formula based on SCr of 0.83 mg/dL).  Assessment: 84 year old female transferred from [place] with invasive cardiac workup for elevated cardiac enzymes. Patient with history of afib and appears to be on apixaban prior to admission with a last dose of  04/22/18 per medication history. Heparin started at OSH and to be continued here.  -aPTT= 81 and at goal on 1100 units/hr  Goal of Therapy:  Heparin level 0.3-0.7 units/ml Monitor platelets by anticoagulation protocol: Yes   Plan:  continue heparin 1100 units/hr Daily HL aPTT CBC (likely can stop aPTT fri am) F/U cards plans - plan to convert back to apixaban today?  Levester Fresh, PharmD, BCPS, BCCCP Clinical Pharmacist [phone]  Please check AMION for all [place] numbers  04/27/2018 8:01 AM

## 2018-04-28 DIAGNOSIS — Z7901 Long term (current) use of anticoagulants: Secondary | ICD-10-CM

## 2018-04-28 DIAGNOSIS — L8990 Pressure ulcer of unspecified site, unspecified stage: Secondary | ICD-10-CM

## 2018-04-28 DIAGNOSIS — Z23 Encounter for immunization: Secondary | ICD-10-CM | POA: Diagnosis not present

## 2018-04-28 DIAGNOSIS — E871 Hypo-osmolality and hyponatremia: Secondary | ICD-10-CM

## 2018-04-28 DIAGNOSIS — E876 Hypokalemia: Secondary | ICD-10-CM

## 2018-04-28 LAB — BASIC METABOLIC PANEL
ANION GAP: 13 (ref 5–15)
BUN: 23 mg/dL (ref 8–23)
CALCIUM: 8.7 mg/dL — AB (ref 8.9–10.3)
CO2: 25 mmol/L (ref 22–32)
Chloride: 95 mmol/L — ABNORMAL LOW (ref 98–111)
Creatinine, Ser: 0.86 mg/dL (ref 0.44–1.00)
GFR calc non Af Amer: >60 mL/min (ref >60)
GLUCOSE: 127 mg/dL — AB (ref 70–99)
POTASSIUM: 3.3 mmol/L — AB (ref 3.5–5.1)
Sodium: 133 mmol/L — ABNORMAL LOW (ref 135–145)

## 2018-04-28 MED ORDER — POTASSIUM CHLORIDE CRYS ER 20 MEQ PO TBCR
40.0000 meq | EXTENDED_RELEASE_TABLET | Freq: Once | ORAL | Status: AC
  Administered 2018-04-28: 40 meq via ORAL
  Filled 2018-04-28: qty 2

## 2018-04-28 MED ORDER — APIXABAN 2.5 MG PO TABS: 2.5000 mg | ORAL_TABLET | Freq: Two times a day (BID) | ORAL | 4 refills | Status: AC

## 2018-04-28 MED ORDER — POLYSACCHARIDE IRON COMPLEX 150 MG PO CAPS: 150.0000 mg | ORAL_CAPSULE | Freq: Every day | ORAL | 4 refills | Status: AC

## 2018-04-28 MED ORDER — POTASSIUM CHLORIDE CRYS ER 20 MEQ PO TBCR: 20.0000 meq | EXTENDED_RELEASE_TABLET | Freq: Every day | ORAL | Status: DC

## 2018-04-28 MED ORDER — POTASSIUM CHLORIDE CRYS ER 20 MEQ PO TBCR: 20.0000 meq | EXTENDED_RELEASE_TABLET | Freq: Every day | ORAL | 4 refills | Status: DC

## 2018-04-28 NOTE — Care Management Important Message (Signed)
Important Message  Patient Details  Name: Brooke Mullins MRN: [id number] Date of Birth: [birth year]   Medicare Important Message Given:  Yes    Barb Merino Tenessa Marsee 04/28/2018, 4:38 PM

## 2018-04-28 NOTE — Progress Notes (Signed)
SATURATION QUALIFICATIONS: (This note is used to comply with regulatory documentation for home oxygen)  Patient Saturations on Room Air at Rest = 90%  Patient Saturations on Room Air while Ambulating = 84%  Patient Saturations on 4 Liters of oxygen while Ambulating = 90%  Please briefly explain why patient needs home oxygen: Pt with decreased oxygen sats during ambulation on RA and requiring 4L and pursed lip breathing to return to 88-93%. Required supplemental oxygen to maintain oxygen saturations at appropriate level.   Leighton Ruff, PT, DPT  Acute Rehabilitation Services  Pager: [phone] Office: [phone]

## 2018-04-28 NOTE — Progress Notes (Addendum)
**Note Brooke-Identified via Obfuscation** Progress Note  Patient Name: Brooke Mullins Date of Encounter: 04/28/2018  Primary Cardiologist: [name], MD    Subjective   Laying comfortably in bed.  She feels as though she is ready to go home.  She lives with her husband.  She was able to get in her chair yesterday and spend a few hours there.  No chest pain, no shortness of breath.  Inpatient Medications    Scheduled Meds: . amiodarone  200 mg Oral Daily  . apixaban  2.5 mg Oral BID  . aspirin  81 mg Oral Daily  . escitalopram  20 mg Oral QHS  . feeding supplement (ENSURE ENLIVE)  237 mL Oral BID BM  . furosemide  60 mg Intravenous Daily  . Influenza vac split quadrivalent PF  0.5 mL Intramuscular Tomorrow-1000  . iron polysaccharides  150 mg Oral Daily  . mouth rinse  15 mL Mouth Rinse BID  . nitroGLYCERIN  0.5 inch Topical Q6H  . pantoprazole  40 mg Oral Daily  . sodium chloride flush  3 mL Intravenous Q12H   Continuous Infusions: . sodium chloride 250 mL (04/26/18 1718)   PRN Meds: sodium chloride, acetaminophen, ondansetron (ZOFRAN) IV, sodium chloride flush   Vital Signs    Vitals:   04/28/18 0346 04/28/18 0508 04/28/18 0509 04/28/18 0716  BP: (!) 117/50   (!) 94/52  Pulse: (!) 103   84  Resp: (!) 23 19 (!) 23 18  Temp: 98.1 F (36.7 C)   98.7 F (37.1 C)  TempSrc: Oral   Oral  SpO2: 96% 98% 96% 94%  Weight: 61.7 kg     Height:        Intake/Output Summary (Last 24 hours) at 04/28/2018 0928 Last data filed at 04/28/2018 0401 Gross per 24 hour  Intake 483 ml  Output 1800 ml  Net -1317 ml   Filed Weights   04/26/18 0502 04/27/18 0421 04/28/18 0346  Weight: 56 kg 61.1 kg 61.7 kg    Telemetry    Atrial fibrillation, left bundle branch block- Personally Reviewed  ECG    Atrial fibrillation left bundle branch block- Personally Reviewed  Physical Exam   GEN: No acute distress.  Elderly Neck: No JVD Cardiac:  Irregularly irregular, no murmurs, rubs, or gallops.  Respiratory: Clear  to auscultation bilaterally. GI: Soft, nontender, non-distended  MS: No edema; No deformity.  Mild pressure ulcer noted by nursing Neuro:  Nonfocal  Psych: Normal affect   Labs    Chemistry Recent Labs  Lab 04/25/18 2040 04/26/18 0225 04/27/18 0319 04/28/18 0334  NA 137 137 133* 133*  K 3.8 3.5 3.5 3.3*  CL 100 97* 97* 95*  CO2 23 25 27 25   GLUCOSE 89 99 130* 127*  BUN 17 18 25* 23  CREATININE 0.85 0.88 0.83 0.86  CALCIUM 8.8* 8.7* 8.6* 8.7*  PROT 6.6  --   --   --   ALBUMIN 2.7*  --   --   --   AST 60*  --   --   --   ALT 45*  --   --   --   ALKPHOS 144*  --   --   --   BILITOT 0.6  --   --   --   GFRNONAA >60 60* >60 >60  GFRAA >60 >60 >60 >60  ANIONGAP 14 15 9 13      Hematology Recent Labs  Lab 04/25/18 2040 04/26/18 0705 04/27/18 0319 04/27/18 0931  WBC 7.8  6.2 7.1  --   RBC 3.92 3.80* 3.74* 4.08  HGB 9.2* 8.8* 8.8*  --   HCT 30.3* 29.3* 28.5*  --   MCV 77.3* 77.1* 76.2*  --   MCH 23.5* 23.2* 23.5*  --   MCHC 30.4 30.0 30.9  --   RDW 17.7* 17.4* 17.6*  --   PLT 334 308 330  --     Cardiac Enzymes Recent Labs  Lab 04/25/18 2040 04/26/18 0225 04/26/18 0705  TROPONINI 0.75* 0.69* 0.60*   No results for input(s): TROPIPOC in the last 168 hours.   BNP Recent Labs  Lab 04/25/18 2040  BNP 398.4*     DDimer No results for input(s): DDIMER in the last 168 hours.   Radiology    No results found.  Cardiac Studies   EF 65 to 70% with severe concentric LV wall thickening, speckled interventricular septum suggestive of possible infiltrative cardiomyopathy such as amyloidosis.  Patient Profile     84 y.o. female with permanent atrial fibrillation left bundle branch block diabetes hyperlipidemia prior fall with bleeding into the right eye and vision loss with right maxillary artery embolization at [facility] in July 2019 transferred from [facility] with shortness of breath.  Assessment & Plan    Acute diastolic heart  failure -Hospitalist team consulted, did not think that she had pneumonia. -BNP 398 on admission, EF normal -On IV Lasix.  Replete potassium. - We will discharge on Lasix 20 mg daily.  With 20 mEq of potassium daily as well. - Out 3.7 L in total.  Weight 61.7 kg. - Discussion and echo report about potentially obtaining a PYP scan.  Given her advanced age, I will defer to her primary cardiologist to decide whether or not this would be valuable.  Elevated troponin - Demand ischemia in the setting of underlying illness  Permanent atrial fibrillation -Rate control, continue with amiodarone for assistance with this.  Blood pressures have been soft in the past.  Chronic anticoagulation -Eliquis only, no aspirin.  She is at increased bleeding risk  Secondary pulmonary hypertension - Likely secondary to underlying lung pathology as well as elevated left heart pressures from diastolic dysfunction.  Hyponatremia - 133.  Stable.  Free water restriction.  Pressure ulcer noted by nursing team-see their note.  Mild, continue to treat.  I will have physical therapy assess her prior to discharge.  I anticipate that she will be able to go home with her husband with potential home health.  For questions or updates, please contact [place] Please consult [email or web address] for contact info under   Anticipate discharge later today after physical therapy assessment    Signed, Candee Furbish, MD  04/28/2018, 9:28 AM

## 2018-04-28 NOTE — Progress Notes (Signed)
04/28/2018 4:36 PM Discharge AVS meds taken today and those due this evening reviewed.  Follow-up appointments and when to call md reviewed.  D/C IV and TELE.  Questions and concerns addressed.   D/C home per orders. [place]

## 2018-04-28 NOTE — Care Management Note (Addendum)
Case Management Note Marvetta Gibbons RN, BSN Unit 4E- RN Care Coordinator  [phone]  Patient Details  Name: Brooke Mullins MRN: [id number] Date of Birth: Jun 18, 1934  Subjective/Objective:    Pt admitted with acute on chronic HF, NSTEMI                Action/Plan: PTA pt lived at home, per PT eval recommendation for [facility] and home 02. Bedside RN to call for orders, spoke with pt at bedside- choice offered for [facility] agency- per pt she has used [facility] in past and would like to use them again, no preference for DME- will use [facility] for home 02. Pt's daughter to come take pt home, Call made to Donna/Brad with [facility] for home 02 needs- once approved portable tanks to be delivered to room prior to discharge. Referral called to [facility], spoke with Ivin Booty who accepted referral, needed paperwork along with orders faxed to [phone] via epic. Address and phone confirmed with pt in epic along with PCP.   Expected Discharge Date:  04/28/18               Expected Discharge Plan:  [place]  In-House Referral:     Discharge planning Services  CM Consult  Post Acute Care Choice:  Durable Medical Equipment, Home Health Choice offered to:  Patient  DME Arranged:  Oxygen DME Agency:  [place] Arranged:   RN, PT [facility] Agency:  [place]  Status of Service:  Completed, signed off  If discussed at [place] of Stay Meetings, dates discussed:    Discharge Disposition: home/home health   Additional Comments:  Dawayne Patricia, RN 04/28/2018, 2:00 PM

## 2018-04-28 NOTE — Evaluation (Signed)
Physical Therapy Evaluation Patient Details Name: Brooke Mullins MRN: [id number] DOB: 03-05-1934 Today's Date: 04/28/2018   History of Present Illness  Pt is an 84 y/o female admitted from [facility] secondary to worsening SOB. Found to have acute diastolic heart failure. PMH includes a fib, pulmonary HTN, DM, PVD, and osteoporosis.   Clinical Impression  Pt admitted secondary to problem above with deficits below. Pt mildly unsteady during gait with RW and required min guard A for mobility. Pt's oxygen sats decreasing to 84% on RA, and required 4L to maintain sats between 88-93%. Educated about use of RW at home. Will continue to follow acutely to maximize functional mobility independence and safety.     Follow Up Recommendations Home health PT;Supervision for mobility/OOB    Equipment Recommendations  None recommended by PT    Recommendations for Other Services       Precautions / Restrictions Precautions Precautions: Fall;Other (comment) Precaution Comments: watch oxygen sats Restrictions Weight Bearing Restrictions: No      Mobility  Bed Mobility               General bed mobility comments: In chair upon entry.   Transfers Overall transfer level: Needs assistance Equipment used: Rolling walker (2 wheeled) Transfers: Sit to/from Stand Sit to Stand: Min guard         General transfer comment: Min guard for safety. Verbal cues for safe hand placement.   Ambulation/Gait Ambulation/Gait assistance: Min guard Gait Distance (Feet): 100 Feet Assistive device: Rolling walker (2 wheeled) Gait Pattern/deviations: Step-through pattern;Decreased stride length Gait velocity: Decreased    General Gait Details: Slow, mildly unsteady gait requiring min guard A with use of RW. Oxygen sats decreasing to 84% on RA, and required 4L and pursed lip breathing to return to 88-93%. Educated about use of RW at home.   Stairs            Wheelchair Mobility    Modified  Rankin (Stroke Patients Only)       Balance Overall balance assessment: Needs assistance Sitting-balance support: No upper extremity supported;Feet supported Sitting balance-Leahy Scale: Good     Standing balance support: Bilateral upper extremity supported;During functional activity Standing balance-Leahy Scale: Poor Standing balance comment: Reliant on BUE support.                              Pertinent Vitals/Pain Pain Assessment: No/denies pain    Home Living Family/patient expects to be discharged to:: Private residence Living Arrangements: Spouse/significant other;Children Available Help at Discharge: Family;Available 24 hours/day Type of Home: House Home Access: Ramped entrance     Home Layout: One level Home Equipment: Walker - 2 wheels;Cane - single point;Bedside commode;Shower seat      Prior Function Level of Independence: Needs assistance   Gait / Transfers Assistance Needed: Reports she was independent with ambulation inside of home, however, required use of cane outside of home.   ADL's / Homemaking Assistance Needed: Needs assist with ADL tasks.         Hand Dominance        Extremity/Trunk Assessment   Upper Extremity Assessment Upper Extremity Assessment: Generalized weakness    Lower Extremity Assessment Lower Extremity Assessment: Generalized weakness    Cervical / Trunk Assessment Cervical / Trunk Assessment: Kyphotic  Communication   Communication: No difficulties  Cognition Arousal/Alertness: Awake/alert Behavior During Therapy: WFL for tasks assessed/performed Overall Cognitive Status: Within Functional Limits for tasks assessed  General Comments      Exercises     Assessment/Plan    PT Assessment Patient needs continued PT services  PT Problem List Decreased strength;Decreased balance;Decreased mobility;Decreased knowledge of use of DME;Decreased knowledge  of precautions;Cardiopulmonary status limiting activity       PT Treatment Interventions DME instruction;Gait training;Functional mobility training;Therapeutic activities;Therapeutic exercise;Balance training;Patient/family education    PT Goals (Current goals can be found in the Care Plan section)  Acute Rehab PT Goals Patient Stated Goal: to go home  PT Goal Formulation: With patient Time For Goal Achievement: 05/12/18 Potential to Achieve Goals: Good    Frequency Min 3X/week   Barriers to discharge        Co-evaluation               AM-PAC PT "6 Clicks" Daily Activity  Outcome Measure Difficulty turning over in bed (including adjusting bedclothes, sheets and blankets)?: A Little Difficulty moving from lying on back to sitting on the side of the bed? : Unable Difficulty sitting down on and standing up from a chair with arms (e.g., wheelchair, bedside commode, etc,.)?: Unable Help needed moving to and from a bed to chair (including a wheelchair)?: A Little Help needed walking in hospital room?: A Little Help needed climbing 3-5 steps with a railing? : A Lot 6 Click Score: 13    End of Session Equipment Utilized During Treatment: Gait belt;Oxygen Activity Tolerance: Patient tolerated treatment well Patient left: in chair;with call bell/phone within reach Nurse Communication: Mobility status PT Visit Diagnosis: Unsteadiness on feet (R26.81);Other abnormalities of gait and mobility (R26.89);Muscle weakness (generalized) (M62.81)    Time: 8101-7510 PT Time Calculation (min) (ACUTE ONLY): 21 min   Charges:   PT Evaluation $PT Eval Low Complexity: [place], PT, DPT  Acute Rehabilitation Services  Pager: [phone] Office: [phone]   Rudean Hitt 04/28/2018, 2:09 PM

## 2018-04-28 NOTE — Discharge Summary (Addendum)
Discharge Summary    Patient ID: Brooke Mullins MRN: [id number]; DOB: [birth year]  Admit date: 04/25/2018 Discharge date: 04/28/2018  Primary Care Provider: [name], MD  Primary Cardiologist: [name], MD  Primary Electrophysiologist:  Will Meredith Leeds, MD   Discharge Diagnoses    Principal Problem:   Acute diastolic heart failure [facility]) Active Problems:   Dyslipidemia   Hypertension   Persistent atrial fibrillation (HCC)   Cough   Anemia   Pressure injury of skin   Chronic anticoagulation   Hypokalemia   Hyponatremia  Allergies Allergies  Allergen Reactions  . Penicillins Anaphylaxis and Other (See Comments)    Has patient had a PCN reaction causing immediate rash, facial/tongue/throat swelling, SOB or lightheadedness with hypotension: Yes Has patient had a PCN reaction causing severe rash involving mucus membranes or skin necrosis: No Has patient had a PCN reaction that required hospitalization: No Has patient had a PCN reaction occurring within the last 10 years: No If all of the above answers are "NO", then may proceed with Cephalosporin use.   . Atorvastatin Other (See Comments)    Muscle pain  . Codeine Other (See Comments)    Nausea/Vomiting  . Colesevelam Other (See Comments)    Unsure of exact reaction type  . Ezetimibe Other (See Comments)    Pain in legs   Diagnostic Studies/Procedures    Echocardiogram 04/26/18: Study Conclusions  - Left ventricle: The cavity size was normal. There was severe   concentric hypertrophy. Systolic function was vigorous. The   estimated ejection fraction was in the range of 65% to 70%. Wall   motion was normal; there were no regional wall motion   abnormalities. The study is not technically sufficient to allow   evaluation of LV diastolic function. - Aortic valve: Sclerosis without stenosis. There was trivial   regurgitation. - Mitral valve: Calcified annulus. Mildly thickened leaflets .   There  was trivial regurgitation. - Left atrium: Moderately dilated. - Right ventricle: The cavity size was mildly dilated. Mildly   reduced systolic function. - Tricuspid valve: There was moderate regurgitation. - Pulmonary arteries: PA peak pressure: 59 mm Hg (S). - Inferior vena cava: The vessel was normal in size. The   respirophasic diameter changes were in the normal range (>= 50%),   consistent with normal central venous pressure.  Impressions:  - LVEF 65-70%, severe concentric LV wall thickening - speckled   intraventricular septum suggestive of infiltrative cardiomyopathy   such as amyloidosis. Consider PYP nuclear scan. Grossly normal   wall motion, aortic sclerosis with trivial AI, MAC with trivial   MR, moderate LAE, mildly dilated RV with reduced systolic   function, moderate TR, RVSP 59 mmHg, normal IVC.  History of Present Illness     84 y.o. female with a hx of persistent afib, chronic LBBB, carotid stensis, DM2, HL, PSVT, HTN. History of fall with bleeding of right eye and vision loss. History of right maxillary artery embolization recently at [facility] 01/2018. Admitted to [facility] with SOB>>transferred to [facility] for further evaluation.   Brooke Mullins is an 84yo female with history as stated above. Family reported approximately one week progressive shortness of breath which worsened on Saturday prior to admission. Patient stated that she had a productive cough with yellow sputum production, subjective fevers and chills. She denied chest pain. She was initially admitted to [facility] 04/24/18 with dizziness, SOB, nonproductive cough. ER notes indicate she was initially hypotensive, however BP's improved quickly with IVFs,  requiring IV Lasix. Additionally, she was hypoxic on presentation, requiring Bipap ventilation. Plan was made to transfer pt to [facility] ([place]) however there were initially no beds available, making their wait at [facility] much longer than anticipated.    Hospital  Course     On 04/25/18 pt was transferred to [facility] given diffuse haziness on CXR and CT at [facility], elevated BNP (398) suggesting fluid overload and  possibly diatolic HF based on her last echo 09/2017. She was initially placed on Bipap with improvement in aeration which was eventually weaned down to nasal cannula with adequate O2 saturations. Given productive cough, fevers and chills, hospitalist service was consulted for possible PNA (recent PNA admission to [facility] 01/2018). She was diuresed with IV Lasix 20m. Troponin leveles were elevated (0.69>0.60), however were flat and stable with no anginal symptoms, likely in the setting of fluid volume overload as well as hypoxia. Chest CT negative for PE. Plans were for echocardiogram performed on 04/26/18 which showed severe concentric hypertrophy, LVEF of 65% to 70% with severe concentric LV wall thickening with speckled intraventricular septum suggestive of infiltrative cardiomyopathy such as amyloidosis. Consider PYP nuclear scan. Grossly normal wall motion, aortic sclerosis with trivial AI, MAC with trivial MR, moderate LAE, mildly dilated RV with reduced systolic function, moderate TR, RVSP 59 mmHg, normal IVC.   Pt was evaluated with PT and recommends HH PT and RN. I will place those orders, as well as for orders for home 02 4L to keep saturations up with ambulation.   -Weight on discharge 136lb, there have been some significant weight indiscretions since admission  -Net negative 3.7L on day of discharge   Medication Plan: -Lasix 29mPO daily along with 2056mK+ -Iron supplementation added  -Eliquis decreased from full dose to 2.5mg15mice daily  -Will need BMET and CBC at follow up   Other hospital problems include:  -Persistent atrial fibrillation: -Remains in AF with rate control during hospital course  -Eliquis held>>anticoagulated with Hep gtt initially  -Eliquis added 04/27/18 and Hep gtt stopped   -Anemia -Hgb 09/2017 12.1, from June  2019 istat Hgb 12.2 -Admit Hgb 8.3, repeat following day at [facility].  -She was transfused 1 unit PRBC -Hb on discharge, 8.8 -No outward s/s of acute bleeding   Secondary pulmonary hypertension -Likely secondary to underlying lung pathology as well as elevated left heart pressures from diastolic dysfunction.  Hyponatremia'hypokalemia -133 on day of discharge, asymptomatic - Free water restriction -K+, 3.3. 20me79mur added to regimen   -Pressure ulcer noted by nursing team -Mild, continue to keep direct pressure off site -No skin breakdown   Consultants: Hospitalist   The patient has been seen and examined by Dr. SkainMarlou Porchfeels that she is stable and ready for discharge on 04/28/2017 after PT assessment. Up to chair on evaluation on day of discharge without complaints of chest pain or shortness of breath.  _____________  Discharge Vitals Blood pressure 106/64, pulse 88, temperature 98.6 F (37 C), temperature source Oral, resp. rate (!) 23, height _0  (1.626 m), weight 61.7 kg, SpO2 96 %.  Filed Weights   04/26/18 0502 04/27/18 0421 04/28/18 0346  Weight: 56 kg 61.1 kg 61.7 kg   Labs & Radiologic Studies    CBC Recent Labs    04/25/18 2040 04/26/18 0705 04/27/18 0319  WBC 7.8 6.2 7.1  NEUTROABS 5.6  --   --   HGB 9.2* 8.8* 8.8*  HCT 30.3* 29.3* 28.5*  MCV 77.3* 77.1* 76.2*  PLT  334 308 283   Basic Metabolic Panel Recent Labs    04/25/18 2040  04/27/18 0319 04/28/18 0334  NA 137   < > 133* 133*  K 3.8   < > 3.5 3.3*  CL 100   < > 97* 95*  CO2 23   < > 27 25  GLUCOSE 89   < > 130* 127*  BUN 17   < > 25* 23  CREATININE 0.85   < > 0.83 0.86  CALCIUM 8.8*   < > 8.6* 8.7*  MG 2.0  --   --   --    < > = values in this interval not displayed.   Liver Function Tests Recent Labs    04/25/18 2040  AST 60*  ALT 45*  ALKPHOS 144*  BILITOT 0.6  PROT 6.6  ALBUMIN 2.7*   Cardiac Enzymes Recent Labs    04/25/18 2040 04/26/18 0225 04/26/18 0705    TROPONINI 0.75* 0.69* 0.60*   Thyroid Function Tests Recent Labs    04/25/18 2040  TSH 6.142*   _____________  Dg Chest Port 1 View  Result Date: 04/25/2018 CLINICAL DATA:  84 year old female with shortness of breath. EXAM: PORTABLE CHEST 1 VIEW COMPARISON:  Chest CT dated 04/24/2018 FINDINGS: Diffuse chronic interstitial coarsening. Bilateral mid to lower lung field hazy densities noted which may be chronic or represent edema or inflammatory/infectious etiology. No focal consolidation, pleural effusion, or pneumothorax. There is mild cardiomegaly. Atherosclerotic calcification of the aortic arch. Osteopenia with degenerative changes of the spine and midthoracic vertebroplasty. No acute osseous pathology. IMPRESSION: 1. No new consolidation. 2. Chronic parenchymal disease. Bilateral lower lung field hazy densities may be chronic or represent edema or inflammatory/infectious etiology. Electronically Signed   By: Anner Crete M.D.   On: 04/25/2018 23:56   Disposition   Pt is being discharged home today in good condition.  Follow-up Plans & Appointments    Follow-up Information    Park Liter, MD Follow up on 05/05/2018.   Specialty:  Cardiology Why:  Your follow-up appointment with with Dr. Agustin Cree is on 05/05/2018 at 1:20 PM. Contact information: [place] [address] [phone]          Discharge Instructions    ([place]) Call MD:  Anytime you have any of the following symptoms: 1) 3 pound weight gain in 24 hours or 5 pounds in 1 week 2) shortness of breath, with or without a dry hacking cough 3) swelling in the hands, feet or stomach 4) if you have to sleep on extra pillows at night in order to breathe.   Complete by:  As directed    Call MD for:  difficulty breathing, headache or visual disturbances   Complete by:  As directed    Call MD for:  extreme fatigue   Complete by:  As directed    Call MD for:  hives   Complete  by:  As directed    Call MD for:  persistant dizziness or light-headedness   Complete by:  As directed    Call MD for:  persistant nausea and vomiting   Complete by:  As directed    Call MD for:  redness, tenderness, or signs of infection (pain, swelling, redness, odor or green/yellow discharge around incision site)   Complete by:  As directed    Call MD for:  severe uncontrolled pain   Complete by:  As directed    Call MD for:  temperature >100.4  Complete by:  As directed    Diet - low sodium heart healthy   Complete by:  As directed    Discharge instructions   Complete by:  As directed    If you notice any bleeding such as blood in stool, black tarry stools, blood in urine, nosebleeds or any other unusual bleeding, call your doctor immediately.  One of your heart tests showed weakness of the heart muscle this admission. This may make you more susceptible to weight gain from fluid retention, which can lead to symptoms that we call heart failure. Please follow these special instructions:  1. Follow a low-salt diet - you are allowed no more than 2,038m of sodium per day. Watch your fluid intake. In general, you should not be taking in more than 2 liters of fluid per day (no more than 8 glasses per day). This includes sources of water in foods like soup, coffee, tea, milk, etc. 2. Weigh yourself on the same scale at same time of day and keep a log. 3. Call your doctor: (Anytime you feel any of the following symptoms)  - 3lb weight gain overnight or 5lb within a few days - Shortness of breath, with or without a dry hacking cough  - Swelling in the hands, feet or stomach  - If you have to sleep on extra pillows at night in order to breathe   IT IS IMPORTANT TO LET YOUR DOCTOR KNOW EARLY ON IF YOU ARE HAVING SYMPTOMS SO WE CAN HELP YOU!   Increase activity slowly   Complete by:  As directed       Discharge Medications   Allergies as of 04/28/2018      Reactions   Penicillins  Anaphylaxis, Other (See Comments)   Has patient had a PCN reaction causing immediate rash, facial/tongue/throat swelling, SOB or lightheadedness with hypotension: Yes Has patient had a PCN reaction causing severe rash involving mucus membranes or skin necrosis: No Has patient had a PCN reaction that required hospitalization: No Has patient had a PCN reaction occurring within the last 10 years: No If all of the above answers are "NO", then may proceed with Cephalosporin use.   Atorvastatin Other (See Comments)   Muscle pain   Codeine Other (See Comments)   Nausea/Vomiting   Colesevelam Other (See Comments)   Unsure of exact reaction type   Ezetimibe Other (See Comments)   Pain in legs      Medication List    TAKE these medications   acetaminophen 500 MG tablet Commonly known as:  TYLENOL Take 500 mg by mouth every 6 (six) hours as needed for moderate pain or headache.   amiodarone 200 MG tablet Commonly known as:  PACERONE Take 1 tablet (200 mg total) by mouth daily.   apixaban 2.5 MG Tabs tablet Commonly known as:  ELIQUIS Take 1 tablet (2.5 mg total) by mouth 2 (two) times daily. What changed:    medication strength  how much to take   escitalopram 20 MG tablet Commonly known as:  LEXAPRO Take 20 mg by mouth at bedtime.   furosemide 20 MG tablet Commonly known as:  LASIX Take 20 mg by mouth daily as needed for fluid.   iron polysaccharides 150 MG capsule Commonly known as:  NIFEREX Take 1 capsule (150 mg total) by mouth daily. Start taking on:  04/29/2018   omeprazole 20 MG capsule Commonly known as:  PRILOSEC Take 20 mg by mouth at bedtime.   potassium chloride SA 20 MEQ  tablet Commonly known as:  K-DUR,KLOR-CON Take 1 tablet (20 mEq total) by mouth daily.   PROAIR HFA 108 (90 Base) MCG/ACT inhaler Generic drug:  albuterol Inhale 1-2 puffs into the lungs every 6 (six) hours as needed for wheezing or shortness of breath.        Acute coronary syndrome  (MI, NSTEMI, STEMI, etc) this admission?: No.    Outstanding Labs/Studies   Will need BMET and CBC at follow up   Duration of Discharge Encounter   Greater than 30 minutes including physician time.  Signed, Kathyrn Drown, NP 04/28/2018, 12:36 PM    Personally seen and examined. Agree with above. Feeling much better.  Ready to go home. Alert and oriented x3 in no acute distress regular rate and rhythm no murmurs rubs or gallops lungs are clear abdomen soft no edema    Acute diastolic heart failure -Hospitalist team consulted, did not think that she had pneumonia. -BNP 398 on admission, EF normal -On IV Lasix.  Replete potassium. - We will discharge on Lasix 20 mg daily.  With 20 mEq of potassium daily as well. - Out 3.7 L in total.  Weight 61.7 kg. - Discussion and echo report about potentially obtaining a PYP scan.  Given her advanced age, I will defer to her primary cardiologist to decide whether or not this would be valuable.  Elevated troponin - Demand ischemia in the setting of underlying illness  Permanent atrial fibrillation -Rate control, continue with amiodarone for assistance with this.  Blood pressures have been soft in the past.  Chronic anticoagulation -Eliquis only, no aspirin.  She is at increased bleeding risk  Secondary pulmonary hypertension - Likely secondary to underlying lung pathology as well as elevated left heart pressures from diastolic dysfunction.  Hyponatremia - 133.  Stable.  Free water restriction.  Pressure ulcer noted by nursing team-see their note.  Mild, continue to treat.  Physical therapy-home health reasonable.

## 2018-04-29 DIAGNOSIS — Z7901 Long term (current) use of anticoagulants: Secondary | ICD-10-CM | POA: Diagnosis not present

## 2018-04-29 DIAGNOSIS — Z9981 Dependence on supplemental oxygen: Secondary | ICD-10-CM | POA: Diagnosis not present

## 2018-04-29 DIAGNOSIS — Z8673 Personal history of transient ischemic attack (TIA), and cerebral infarction without residual deficits: Secondary | ICD-10-CM | POA: Diagnosis not present

## 2018-04-29 DIAGNOSIS — I272 Pulmonary hypertension, unspecified: Secondary | ICD-10-CM | POA: Diagnosis not present

## 2018-04-29 DIAGNOSIS — I4891 Unspecified atrial fibrillation: Secondary | ICD-10-CM | POA: Diagnosis not present

## 2018-04-29 DIAGNOSIS — Z87891 Personal history of nicotine dependence: Secondary | ICD-10-CM | POA: Diagnosis not present

## 2018-04-29 DIAGNOSIS — R296 Repeated falls: Secondary | ICD-10-CM | POA: Diagnosis not present

## 2018-04-29 DIAGNOSIS — Z9181 History of falling: Secondary | ICD-10-CM | POA: Diagnosis not present

## 2018-04-29 DIAGNOSIS — S0231XS Fracture of orbital floor, right side, sequela: Secondary | ICD-10-CM | POA: Diagnosis not present

## 2018-04-29 DIAGNOSIS — D649 Anemia, unspecified: Secondary | ICD-10-CM | POA: Diagnosis not present

## 2018-04-29 DIAGNOSIS — H52511 Internal ophthalmoplegia (complete) (total), right eye: Secondary | ICD-10-CM | POA: Diagnosis not present

## 2018-04-29 DIAGNOSIS — H5461 Unqualified visual loss, right eye, normal vision left eye: Secondary | ICD-10-CM | POA: Diagnosis not present

## 2018-04-29 DIAGNOSIS — I447 Left bundle-branch block, unspecified: Secondary | ICD-10-CM | POA: Diagnosis not present

## 2018-04-29 DIAGNOSIS — I6523 Occlusion and stenosis of bilateral carotid arteries: Secondary | ICD-10-CM | POA: Diagnosis not present

## 2018-04-29 DIAGNOSIS — E871 Hypo-osmolality and hyponatremia: Secondary | ICD-10-CM | POA: Diagnosis not present

## 2018-04-29 DIAGNOSIS — E876 Hypokalemia: Secondary | ICD-10-CM | POA: Diagnosis not present

## 2018-04-29 DIAGNOSIS — I5031 Acute diastolic (congestive) heart failure: Secondary | ICD-10-CM | POA: Diagnosis not present

## 2018-04-29 DIAGNOSIS — I471 Supraventricular tachycardia: Secondary | ICD-10-CM | POA: Diagnosis not present

## 2018-04-29 DIAGNOSIS — I2510 Atherosclerotic heart disease of native coronary artery without angina pectoris: Secondary | ICD-10-CM | POA: Diagnosis not present

## 2018-04-29 DIAGNOSIS — I110 Hypertensive heart disease with heart failure: Secondary | ICD-10-CM | POA: Diagnosis not present

## 2018-05-01 DIAGNOSIS — I2510 Atherosclerotic heart disease of native coronary artery without angina pectoris: Secondary | ICD-10-CM | POA: Diagnosis not present

## 2018-05-01 DIAGNOSIS — Z8673 Personal history of transient ischemic attack (TIA), and cerebral infarction without residual deficits: Secondary | ICD-10-CM | POA: Diagnosis not present

## 2018-05-01 DIAGNOSIS — I4891 Unspecified atrial fibrillation: Secondary | ICD-10-CM | POA: Diagnosis not present

## 2018-05-01 DIAGNOSIS — Z9981 Dependence on supplemental oxygen: Secondary | ICD-10-CM | POA: Diagnosis not present

## 2018-05-01 DIAGNOSIS — R296 Repeated falls: Secondary | ICD-10-CM | POA: Diagnosis not present

## 2018-05-01 DIAGNOSIS — E871 Hypo-osmolality and hyponatremia: Secondary | ICD-10-CM | POA: Diagnosis not present

## 2018-05-01 DIAGNOSIS — H5461 Unqualified visual loss, right eye, normal vision left eye: Secondary | ICD-10-CM | POA: Diagnosis not present

## 2018-05-01 DIAGNOSIS — I5031 Acute diastolic (congestive) heart failure: Secondary | ICD-10-CM | POA: Diagnosis not present

## 2018-05-01 DIAGNOSIS — I6523 Occlusion and stenosis of bilateral carotid arteries: Secondary | ICD-10-CM | POA: Diagnosis not present

## 2018-05-01 DIAGNOSIS — D649 Anemia, unspecified: Secondary | ICD-10-CM | POA: Diagnosis not present

## 2018-05-01 DIAGNOSIS — Z7901 Long term (current) use of anticoagulants: Secondary | ICD-10-CM | POA: Diagnosis not present

## 2018-05-01 DIAGNOSIS — I471 Supraventricular tachycardia: Secondary | ICD-10-CM | POA: Diagnosis not present

## 2018-05-01 DIAGNOSIS — S0231XS Fracture of orbital floor, right side, sequela: Secondary | ICD-10-CM | POA: Diagnosis not present

## 2018-05-01 DIAGNOSIS — I110 Hypertensive heart disease with heart failure: Secondary | ICD-10-CM | POA: Diagnosis not present

## 2018-05-01 DIAGNOSIS — Z9181 History of falling: Secondary | ICD-10-CM | POA: Diagnosis not present

## 2018-05-01 DIAGNOSIS — E876 Hypokalemia: Secondary | ICD-10-CM | POA: Diagnosis not present

## 2018-05-01 DIAGNOSIS — I272 Pulmonary hypertension, unspecified: Secondary | ICD-10-CM | POA: Diagnosis not present

## 2018-05-01 DIAGNOSIS — Z87891 Personal history of nicotine dependence: Secondary | ICD-10-CM | POA: Diagnosis not present

## 2018-05-01 DIAGNOSIS — I447 Left bundle-branch block, unspecified: Secondary | ICD-10-CM | POA: Diagnosis not present

## 2018-05-01 DIAGNOSIS — H52511 Internal ophthalmoplegia (complete) (total), right eye: Secondary | ICD-10-CM | POA: Diagnosis not present

## 2018-05-02 DIAGNOSIS — Z9181 History of falling: Secondary | ICD-10-CM | POA: Diagnosis not present

## 2018-05-02 DIAGNOSIS — I482 Chronic atrial fibrillation, unspecified: Secondary | ICD-10-CM | POA: Diagnosis not present

## 2018-05-02 DIAGNOSIS — Z9981 Dependence on supplemental oxygen: Secondary | ICD-10-CM | POA: Diagnosis not present

## 2018-05-02 DIAGNOSIS — I2510 Atherosclerotic heart disease of native coronary artery without angina pectoris: Secondary | ICD-10-CM | POA: Diagnosis not present

## 2018-05-02 DIAGNOSIS — I110 Hypertensive heart disease with heart failure: Secondary | ICD-10-CM | POA: Diagnosis not present

## 2018-05-02 DIAGNOSIS — I5033 Acute on chronic diastolic (congestive) heart failure: Secondary | ICD-10-CM | POA: Diagnosis not present

## 2018-05-02 DIAGNOSIS — I6523 Occlusion and stenosis of bilateral carotid arteries: Secondary | ICD-10-CM | POA: Diagnosis not present

## 2018-05-02 DIAGNOSIS — I447 Left bundle-branch block, unspecified: Secondary | ICD-10-CM | POA: Diagnosis not present

## 2018-05-02 DIAGNOSIS — R296 Repeated falls: Secondary | ICD-10-CM | POA: Diagnosis not present

## 2018-05-02 DIAGNOSIS — Z8673 Personal history of transient ischemic attack (TIA), and cerebral infarction without residual deficits: Secondary | ICD-10-CM | POA: Diagnosis not present

## 2018-05-02 DIAGNOSIS — J9621 Acute and chronic respiratory failure with hypoxia: Secondary | ICD-10-CM | POA: Diagnosis not present

## 2018-05-02 DIAGNOSIS — S0231XS Fracture of orbital floor, right side, sequela: Secondary | ICD-10-CM | POA: Diagnosis not present

## 2018-05-02 DIAGNOSIS — D649 Anemia, unspecified: Secondary | ICD-10-CM | POA: Diagnosis not present

## 2018-05-02 DIAGNOSIS — J449 Chronic obstructive pulmonary disease, unspecified: Secondary | ICD-10-CM | POA: Diagnosis not present

## 2018-05-02 DIAGNOSIS — Z7901 Long term (current) use of anticoagulants: Secondary | ICD-10-CM | POA: Diagnosis not present

## 2018-05-02 DIAGNOSIS — E871 Hypo-osmolality and hyponatremia: Secondary | ICD-10-CM | POA: Diagnosis not present

## 2018-05-02 DIAGNOSIS — H5461 Unqualified visual loss, right eye, normal vision left eye: Secondary | ICD-10-CM | POA: Diagnosis not present

## 2018-05-02 DIAGNOSIS — H52511 Internal ophthalmoplegia (complete) (total), right eye: Secondary | ICD-10-CM | POA: Diagnosis not present

## 2018-05-02 DIAGNOSIS — I471 Supraventricular tachycardia: Secondary | ICD-10-CM | POA: Diagnosis not present

## 2018-05-02 DIAGNOSIS — I272 Pulmonary hypertension, unspecified: Secondary | ICD-10-CM | POA: Diagnosis not present

## 2018-05-02 DIAGNOSIS — Z87891 Personal history of nicotine dependence: Secondary | ICD-10-CM | POA: Diagnosis not present

## 2018-05-02 DIAGNOSIS — E876 Hypokalemia: Secondary | ICD-10-CM | POA: Diagnosis not present

## 2018-05-04 DIAGNOSIS — E871 Hypo-osmolality and hyponatremia: Secondary | ICD-10-CM | POA: Diagnosis not present

## 2018-05-04 DIAGNOSIS — J9621 Acute and chronic respiratory failure with hypoxia: Secondary | ICD-10-CM | POA: Diagnosis not present

## 2018-05-04 DIAGNOSIS — I110 Hypertensive heart disease with heart failure: Secondary | ICD-10-CM | POA: Diagnosis not present

## 2018-05-04 DIAGNOSIS — H5461 Unqualified visual loss, right eye, normal vision left eye: Secondary | ICD-10-CM | POA: Diagnosis not present

## 2018-05-04 DIAGNOSIS — J449 Chronic obstructive pulmonary disease, unspecified: Secondary | ICD-10-CM | POA: Diagnosis not present

## 2018-05-04 DIAGNOSIS — I482 Chronic atrial fibrillation, unspecified: Secondary | ICD-10-CM | POA: Diagnosis not present

## 2018-05-04 DIAGNOSIS — I272 Pulmonary hypertension, unspecified: Secondary | ICD-10-CM | POA: Diagnosis not present

## 2018-05-04 DIAGNOSIS — Z7901 Long term (current) use of anticoagulants: Secondary | ICD-10-CM | POA: Diagnosis not present

## 2018-05-04 DIAGNOSIS — E876 Hypokalemia: Secondary | ICD-10-CM | POA: Diagnosis not present

## 2018-05-04 DIAGNOSIS — I2510 Atherosclerotic heart disease of native coronary artery without angina pectoris: Secondary | ICD-10-CM | POA: Diagnosis not present

## 2018-05-04 DIAGNOSIS — I471 Supraventricular tachycardia: Secondary | ICD-10-CM | POA: Diagnosis not present

## 2018-05-04 DIAGNOSIS — Z87891 Personal history of nicotine dependence: Secondary | ICD-10-CM | POA: Diagnosis not present

## 2018-05-04 DIAGNOSIS — S0231XS Fracture of orbital floor, right side, sequela: Secondary | ICD-10-CM | POA: Diagnosis not present

## 2018-05-04 DIAGNOSIS — I6523 Occlusion and stenosis of bilateral carotid arteries: Secondary | ICD-10-CM | POA: Diagnosis not present

## 2018-05-04 DIAGNOSIS — I447 Left bundle-branch block, unspecified: Secondary | ICD-10-CM | POA: Diagnosis not present

## 2018-05-04 DIAGNOSIS — Z9181 History of falling: Secondary | ICD-10-CM | POA: Diagnosis not present

## 2018-05-04 DIAGNOSIS — R296 Repeated falls: Secondary | ICD-10-CM | POA: Diagnosis not present

## 2018-05-04 DIAGNOSIS — Z9981 Dependence on supplemental oxygen: Secondary | ICD-10-CM | POA: Diagnosis not present

## 2018-05-04 DIAGNOSIS — H52511 Internal ophthalmoplegia (complete) (total), right eye: Secondary | ICD-10-CM | POA: Diagnosis not present

## 2018-05-04 DIAGNOSIS — I5033 Acute on chronic diastolic (congestive) heart failure: Secondary | ICD-10-CM | POA: Diagnosis not present

## 2018-05-04 DIAGNOSIS — D649 Anemia, unspecified: Secondary | ICD-10-CM | POA: Diagnosis not present

## 2018-05-04 DIAGNOSIS — Z8673 Personal history of transient ischemic attack (TIA), and cerebral infarction without residual deficits: Secondary | ICD-10-CM | POA: Diagnosis not present

## 2018-05-05 ENCOUNTER — Ambulatory Visit: Payer: Medicare Other | Admitting: Cardiology

## 2018-05-08 DIAGNOSIS — I6523 Occlusion and stenosis of bilateral carotid arteries: Secondary | ICD-10-CM | POA: Diagnosis not present

## 2018-05-08 DIAGNOSIS — Z8673 Personal history of transient ischemic attack (TIA), and cerebral infarction without residual deficits: Secondary | ICD-10-CM | POA: Diagnosis not present

## 2018-05-08 DIAGNOSIS — H5461 Unqualified visual loss, right eye, normal vision left eye: Secondary | ICD-10-CM | POA: Diagnosis not present

## 2018-05-08 DIAGNOSIS — Z9181 History of falling: Secondary | ICD-10-CM | POA: Diagnosis not present

## 2018-05-08 DIAGNOSIS — Z87891 Personal history of nicotine dependence: Secondary | ICD-10-CM | POA: Diagnosis not present

## 2018-05-08 DIAGNOSIS — H52511 Internal ophthalmoplegia (complete) (total), right eye: Secondary | ICD-10-CM | POA: Diagnosis not present

## 2018-05-08 DIAGNOSIS — R296 Repeated falls: Secondary | ICD-10-CM | POA: Diagnosis not present

## 2018-05-08 DIAGNOSIS — E876 Hypokalemia: Secondary | ICD-10-CM | POA: Diagnosis not present

## 2018-05-08 DIAGNOSIS — D649 Anemia, unspecified: Secondary | ICD-10-CM | POA: Diagnosis not present

## 2018-05-08 DIAGNOSIS — Z7901 Long term (current) use of anticoagulants: Secondary | ICD-10-CM | POA: Diagnosis not present

## 2018-05-08 DIAGNOSIS — J449 Chronic obstructive pulmonary disease, unspecified: Secondary | ICD-10-CM | POA: Diagnosis not present

## 2018-05-08 DIAGNOSIS — I2510 Atherosclerotic heart disease of native coronary artery without angina pectoris: Secondary | ICD-10-CM | POA: Diagnosis not present

## 2018-05-08 DIAGNOSIS — Z9981 Dependence on supplemental oxygen: Secondary | ICD-10-CM | POA: Diagnosis not present

## 2018-05-08 DIAGNOSIS — I482 Chronic atrial fibrillation, unspecified: Secondary | ICD-10-CM | POA: Diagnosis not present

## 2018-05-08 DIAGNOSIS — I272 Pulmonary hypertension, unspecified: Secondary | ICD-10-CM | POA: Diagnosis not present

## 2018-05-08 DIAGNOSIS — S0231XS Fracture of orbital floor, right side, sequela: Secondary | ICD-10-CM | POA: Diagnosis not present

## 2018-05-08 DIAGNOSIS — I447 Left bundle-branch block, unspecified: Secondary | ICD-10-CM | POA: Diagnosis not present

## 2018-05-08 DIAGNOSIS — I110 Hypertensive heart disease with heart failure: Secondary | ICD-10-CM | POA: Diagnosis not present

## 2018-05-08 DIAGNOSIS — E871 Hypo-osmolality and hyponatremia: Secondary | ICD-10-CM | POA: Diagnosis not present

## 2018-05-08 DIAGNOSIS — Z6823 Body mass index (BMI) 23.0-23.9, adult: Secondary | ICD-10-CM | POA: Diagnosis not present

## 2018-05-08 DIAGNOSIS — I471 Supraventricular tachycardia: Secondary | ICD-10-CM | POA: Diagnosis not present

## 2018-05-08 DIAGNOSIS — J9621 Acute and chronic respiratory failure with hypoxia: Secondary | ICD-10-CM | POA: Diagnosis not present

## 2018-05-08 DIAGNOSIS — D62 Acute posthemorrhagic anemia: Secondary | ICD-10-CM | POA: Diagnosis not present

## 2018-05-08 DIAGNOSIS — I5033 Acute on chronic diastolic (congestive) heart failure: Secondary | ICD-10-CM | POA: Diagnosis not present

## 2018-05-09 DIAGNOSIS — J9621 Acute and chronic respiratory failure with hypoxia: Secondary | ICD-10-CM | POA: Diagnosis not present

## 2018-05-09 DIAGNOSIS — H52511 Internal ophthalmoplegia (complete) (total), right eye: Secondary | ICD-10-CM | POA: Diagnosis not present

## 2018-05-09 DIAGNOSIS — E871 Hypo-osmolality and hyponatremia: Secondary | ICD-10-CM | POA: Diagnosis not present

## 2018-05-09 DIAGNOSIS — I110 Hypertensive heart disease with heart failure: Secondary | ICD-10-CM | POA: Diagnosis not present

## 2018-05-09 DIAGNOSIS — S0231XS Fracture of orbital floor, right side, sequela: Secondary | ICD-10-CM | POA: Diagnosis not present

## 2018-05-09 DIAGNOSIS — J449 Chronic obstructive pulmonary disease, unspecified: Secondary | ICD-10-CM | POA: Diagnosis not present

## 2018-05-09 DIAGNOSIS — H5461 Unqualified visual loss, right eye, normal vision left eye: Secondary | ICD-10-CM | POA: Diagnosis not present

## 2018-05-09 DIAGNOSIS — Z7901 Long term (current) use of anticoagulants: Secondary | ICD-10-CM | POA: Diagnosis not present

## 2018-05-09 DIAGNOSIS — I482 Chronic atrial fibrillation, unspecified: Secondary | ICD-10-CM | POA: Diagnosis not present

## 2018-05-09 DIAGNOSIS — I447 Left bundle-branch block, unspecified: Secondary | ICD-10-CM | POA: Diagnosis not present

## 2018-05-09 DIAGNOSIS — I2510 Atherosclerotic heart disease of native coronary artery without angina pectoris: Secondary | ICD-10-CM | POA: Diagnosis not present

## 2018-05-09 DIAGNOSIS — E876 Hypokalemia: Secondary | ICD-10-CM | POA: Diagnosis not present

## 2018-05-09 DIAGNOSIS — D649 Anemia, unspecified: Secondary | ICD-10-CM | POA: Diagnosis not present

## 2018-05-09 DIAGNOSIS — Z8673 Personal history of transient ischemic attack (TIA), and cerebral infarction without residual deficits: Secondary | ICD-10-CM | POA: Diagnosis not present

## 2018-05-09 DIAGNOSIS — R296 Repeated falls: Secondary | ICD-10-CM | POA: Diagnosis not present

## 2018-05-09 DIAGNOSIS — Z9181 History of falling: Secondary | ICD-10-CM | POA: Diagnosis not present

## 2018-05-09 DIAGNOSIS — Z87891 Personal history of nicotine dependence: Secondary | ICD-10-CM | POA: Diagnosis not present

## 2018-05-09 DIAGNOSIS — I471 Supraventricular tachycardia: Secondary | ICD-10-CM | POA: Diagnosis not present

## 2018-05-09 DIAGNOSIS — Z9981 Dependence on supplemental oxygen: Secondary | ICD-10-CM | POA: Diagnosis not present

## 2018-05-09 DIAGNOSIS — I5033 Acute on chronic diastolic (congestive) heart failure: Secondary | ICD-10-CM | POA: Diagnosis not present

## 2018-05-09 DIAGNOSIS — I272 Pulmonary hypertension, unspecified: Secondary | ICD-10-CM | POA: Diagnosis not present

## 2018-05-09 DIAGNOSIS — I6523 Occlusion and stenosis of bilateral carotid arteries: Secondary | ICD-10-CM | POA: Diagnosis not present

## 2018-05-10 ENCOUNTER — Other Ambulatory Visit: Payer: Self-pay

## 2018-05-10 NOTE — Patient Outreach (Signed)
[facility]) Care Management  05/10/2018  WAFAA DEEMER 04-29-1934 [id number]   Medication Adherence call to Mrs. Rene Kocher spoke with patient she is no longer taking quinapril 40 mg doctor took her off because of low blood pressure. Mrs. Flavell is showing past due under [place].   [place] Management Direct Dial [phone]  Fax [phone] [email or web address]

## 2018-05-11 DIAGNOSIS — Z9981 Dependence on supplemental oxygen: Secondary | ICD-10-CM | POA: Diagnosis not present

## 2018-05-11 DIAGNOSIS — I110 Hypertensive heart disease with heart failure: Secondary | ICD-10-CM | POA: Diagnosis not present

## 2018-05-11 DIAGNOSIS — Z7901 Long term (current) use of anticoagulants: Secondary | ICD-10-CM | POA: Diagnosis not present

## 2018-05-11 DIAGNOSIS — I471 Supraventricular tachycardia: Secondary | ICD-10-CM | POA: Diagnosis not present

## 2018-05-11 DIAGNOSIS — J9621 Acute and chronic respiratory failure with hypoxia: Secondary | ICD-10-CM | POA: Diagnosis not present

## 2018-05-11 DIAGNOSIS — E871 Hypo-osmolality and hyponatremia: Secondary | ICD-10-CM | POA: Diagnosis not present

## 2018-05-11 DIAGNOSIS — H5461 Unqualified visual loss, right eye, normal vision left eye: Secondary | ICD-10-CM | POA: Diagnosis not present

## 2018-05-11 DIAGNOSIS — D649 Anemia, unspecified: Secondary | ICD-10-CM | POA: Diagnosis not present

## 2018-05-11 DIAGNOSIS — I5033 Acute on chronic diastolic (congestive) heart failure: Secondary | ICD-10-CM | POA: Diagnosis not present

## 2018-05-11 DIAGNOSIS — I6523 Occlusion and stenosis of bilateral carotid arteries: Secondary | ICD-10-CM | POA: Diagnosis not present

## 2018-05-11 DIAGNOSIS — Z8673 Personal history of transient ischemic attack (TIA), and cerebral infarction without residual deficits: Secondary | ICD-10-CM | POA: Diagnosis not present

## 2018-05-11 DIAGNOSIS — E876 Hypokalemia: Secondary | ICD-10-CM | POA: Diagnosis not present

## 2018-05-11 DIAGNOSIS — H52511 Internal ophthalmoplegia (complete) (total), right eye: Secondary | ICD-10-CM | POA: Diagnosis not present

## 2018-05-11 DIAGNOSIS — I482 Chronic atrial fibrillation, unspecified: Secondary | ICD-10-CM | POA: Diagnosis not present

## 2018-05-11 DIAGNOSIS — I447 Left bundle-branch block, unspecified: Secondary | ICD-10-CM | POA: Diagnosis not present

## 2018-05-11 DIAGNOSIS — J449 Chronic obstructive pulmonary disease, unspecified: Secondary | ICD-10-CM | POA: Diagnosis not present

## 2018-05-11 DIAGNOSIS — Z87891 Personal history of nicotine dependence: Secondary | ICD-10-CM | POA: Diagnosis not present

## 2018-05-11 DIAGNOSIS — R296 Repeated falls: Secondary | ICD-10-CM | POA: Diagnosis not present

## 2018-05-11 DIAGNOSIS — S0231XS Fracture of orbital floor, right side, sequela: Secondary | ICD-10-CM | POA: Diagnosis not present

## 2018-05-11 DIAGNOSIS — Z9181 History of falling: Secondary | ICD-10-CM | POA: Diagnosis not present

## 2018-05-11 DIAGNOSIS — I272 Pulmonary hypertension, unspecified: Secondary | ICD-10-CM | POA: Diagnosis not present

## 2018-05-11 DIAGNOSIS — I2510 Atherosclerotic heart disease of native coronary artery without angina pectoris: Secondary | ICD-10-CM | POA: Diagnosis not present

## 2018-05-12 ENCOUNTER — Ambulatory Visit (INDEPENDENT_AMBULATORY_CARE_PROVIDER_SITE_OTHER): Payer: Medicare Other | Admitting: Cardiology

## 2018-05-12 ENCOUNTER — Encounter: Payer: Self-pay | Admitting: Cardiology

## 2018-05-12 VITALS — BP 124/74 | HR 64 | Ht 65.0 in | Wt 137.1 lb

## 2018-05-12 DIAGNOSIS — E785 Hyperlipidemia, unspecified: Secondary | ICD-10-CM

## 2018-05-12 DIAGNOSIS — Z7901 Long term (current) use of anticoagulants: Secondary | ICD-10-CM | POA: Diagnosis not present

## 2018-05-12 DIAGNOSIS — I4819 Other persistent atrial fibrillation: Secondary | ICD-10-CM

## 2018-05-12 DIAGNOSIS — I5032 Chronic diastolic (congestive) heart failure: Secondary | ICD-10-CM

## 2018-05-12 NOTE — Progress Notes (Signed)
Cardiology Office Note:    Date:  05/12/2018   ID:  Brooke Mullins, DOB 1933-11-27, MRN [id number]  PCP:  [name], MD  Cardiologist:  [name], MD    Referring MD: [name], MD   Chief Complaint  Patient presents with  . Follow-up  Being in the hospital  History of Present Illness:    Brooke Mullins is a 84 y.o. female with very complex past medical history that include persistent atrial fibrillation, evidence of left ventricle hypertrophy with some suspicion for infiltrative cardiomyopathy, also history of nosebleeding with maxillary artery embolization.  Chronically anticoagulated, recent admission to [facility] because of decompensated congestive heart failure.  She comes today to my office for follow-up also she was seen by EP team with consideration of cardioversion to sinus rhythm she was put on amiodarone however because of all different issues that been going all cardioversion look like did not happen.  She comes today to my office for follow-up overall she says she is doing better she says she is able to cook and walk some she is with oxygen.  She denies having any chest pain tightness squeezing pressure burning chest no swelling of lower extremities.  Past Medical History:  Diagnosis Date  . Atrial fibrillation ([place])   . Carotid artery stenosis with cerebral infarction ([place])   . Carotid atherosclerosis, bilateral   . Chronic bronchitis ([place])   . Depression   . Dyslipidemia   . Edema   . History of blood transfusion 04/24/2018   "low HgB" (04/25/2018)  . Hyperplastic colon polyp   . Hypertension   . Hypothyroidism   . Osteoporosis   . Peripheral vascular disease of extremity ([place])   . PSVT (paroxysmal supraventricular tachycardia) ([place])   . Skin cancer    "scraped off my neck and legs" (04/25/2018)  . Subclavian artery stenosis [facility])     Past Surgical History:  Procedure Laterality Date  . ABDOMINAL HYSTERECTOMY    . APPENDECTOMY    .  CARDIOVERSION N/A 10/17/2017   Procedure: CARDIOVERSION;  Surgeon: Sanda Klein, MD;  Location: [place] ENDOSCOPY;  Service: Cardiovascular;  Laterality: N/A;  . CARDIOVERSION N/A 01/02/2018   Procedure: CARDIOVERSION;  Surgeon: Larey Dresser, MD;  Location: [facility] ENDOSCOPY;  Service: Cardiovascular;  Laterality: N/A;  . CATARACT EXTRACTION W/ INTRAOCULAR LENS  IMPLANT, BILATERAL Bilateral   . COLONOSCOPY W/ BIOPSIES AND POLYPECTOMY    . FRACTURE SURGERY    . HIP FRACTURE SURGERY Left 09/2006  . ORIF ORBITAL FRACTURE  01/07/2018   ORBITOTOMY ANTERIOR WITH DRAINAGE OF ORBITAL HEMATOMA, ORIF ORBITAL FRACTURE,ORBITAL DECOMPRESSION/care everywhere notes    Current Medications: Current Meds  Medication Sig  . amiodarone (PACERONE) 200 MG tablet Take 1 tablet (200 mg total) by mouth daily.  Marland Kitchen apixaban (ELIQUIS) 2.5 MG TABS tablet Take 1 tablet (2.5 mg total) by mouth 2 (two) times daily.  Marland Kitchen escitalopram (LEXAPRO) 20 MG tablet Take 20 mg by mouth at bedtime.  . furosemide (LASIX) 20 MG tablet Take 20 mg by mouth daily as needed for fluid.   Marland Kitchen iron polysaccharides (NIFEREX) 150 MG capsule Take 1 capsule (150 mg total) by mouth daily. (Patient taking differently: Take 300 mg by mouth daily. )  . omeprazole (PRILOSEC) 20 MG capsule Take 20 mg by mouth at bedtime.      Allergies:   Penicillins; Atorvastatin; Codeine; Colesevelam; and Ezetimibe   Social History   Socioeconomic History  . Marital status: Married  Spouse name: Not on file  . Number of children: Not on file  . Years of education: Not on file  . Highest education level: Not on file  Occupational History  . Not on file  Social Needs  . Financial resource strain: Not on file  . Food insecurity:    Worry: Not on file    Inability: Not on file  . Transportation needs:    Medical: Not on file    Non-medical: Not on file  Tobacco Use  . Smoking status: Former Smoker    Packs/day: 1.00    Years: 30.00    Pack years: 30.00     Types: Cigarettes  . Smokeless tobacco: Never Used  . Tobacco comment: 04/25/2018 "quit smoking in the 1980s"  Substance and Sexual Activity  . Alcohol use: Never    Frequency: Never  . Drug use: Never  . Sexual activity: Not on file  Lifestyle  . Physical activity:    Days per week: Not on file    Minutes per session: Not on file  . Stress: Not on file  Relationships  . Social connections:    Talks on phone: Not on file    Gets together: Not on file    Attends religious service: Not on file    Active member of club or organization: Not on file    Attends meetings of clubs or organizations: Not on file    Relationship status: Not on file  Other Topics Concern  . Not on file  Social History Narrative  . Not on file     Family History: The patient's family history includes COPD in her father; Cancer in her brother; Heart failure in her mother; Hypertension in her brother. ROS:   Please see the history of present illness.    All 14 point review of systems negative except as described per history of present illness  EKGs/Labs/Other Studies Reviewed:      Recent Labs: 04/25/2018: ALT 45; B Natriuretic Peptide 398.4; Magnesium 2.0; TSH 6.142 04/27/2018: Hemoglobin 8.8; Platelets 330 04/28/2018: BUN 23; Creatinine, Ser 0.86; Potassium 3.3; Sodium 133  Recent Lipid Panel No results found for: CHOL, TRIG, HDL, CHOLHDL, VLDL, LDLCALC, LDLDIRECT  Physical Exam:    VS:  BP 124/74   Pulse 64   Ht 5\' 5"  (1.651 m)   Wt 137 lb 1.9 oz (62.2 kg)   SpO2 98% Comment: 4 lt of O2  BMI 22.82 kg/m     Wt Readings from Last 3 Encounters:  05/12/18 137 lb 1.9 oz (62.2 kg)  04/28/18 136 lb (61.7 kg)  03/08/18 140 lb 12.8 oz (63.9 kg)     GEN:  Well nourished, well developed in no acute distress HEENT: Normal NECK: No JVD; No carotid bruits LYMPHATICS: No lymphadenopathy CARDIAC: Irregularly irregular.  No murmurs, no rubs, no gallops RESPIRATORY:  Clear to auscultation without  rales, wheezing or rhonchi  ABDOMEN: Soft, non-tender, non-distended MUSCULOSKELETAL:  No edema; No deformity  SKIN: Warm and dry LOWER EXTREMITIES: no swelling NEUROLOGIC:  Alert and oriented x 3 PSYCHIATRIC:  Normal affect   ASSESSMENT:    1. Persistent atrial fibrillation   2. Chronic anticoagulation   3. Dyslipidemia   4. Chronic diastolic congestive heart failure, NYHA class 3 (HCC)    PLAN:    In order of problems listed above:  1. Persistent atrial fibrillation.  We will do EKG to confirm the rhythm.  Continue anticoagulation.  Plan will be if she still in atrial  fibrillation to bring her back in about 3 to 4 weeks and talk about potentially cardioverting her to sinus rhythm 2. Chronic anticoagulation will continue. 3. Dyslipidemia on statin which I will continue. 4. Chronic diastolic congestive heart failure.  On appropriate medication seems to be stable.  I will check Chem-7 proBNP today.  We discussed the issue of salt avoidance being careful with fluids and also I asked her to let us know if her weight started increasing with no particular reason also described to her signs and symptoms of congestive heart failure.  She seems to be very knowledgeable about that. 5. Possibility of infiltrative cardiomyopathy.  Will consider doing MRI however that issue will be discussed next time when I see her   Medication Adjustments/Labs and Tests Ordered: Current medicines are reviewed at length with the patient today.  Concerns regarding medicines are outlined above.  No orders of the defined types were placed in this encounter.  Medication changes: No orders of the defined types were placed in this encounter.   Signed, Park Liter, MD, [facility] 05/12/2018 9:48 AM    [place]

## 2018-05-12 NOTE — Patient Instructions (Signed)
Medication Instructions:  Your physician recommends that you continue on your current medications as directed. Please refer to the Current Medication list given to you today.  If you need a refill on your cardiac medications before your next appointment, please call your pharmacy.   Lab work: Your physician recommends that you return for lab work today: BMP and BNP   If you have labs (blood work) drawn today and your tests are completely normal, you will receive your results only by: Marland Kitchen MyChart Message (if you have MyChart) OR . A paper copy in the mail If you have any lab test that is abnormal or we need to change your treatment, we will call you to review the results.  Testing/Procedures: None  Follow-Up: At [facility], you and your health needs are our priority.  As part of our continuing mission to provide you with exceptional heart care, we have created designated Provider Care Teams.  These Care Teams include your primary Cardiologist (physician) and Advanced Practice Providers (APPs -  Physician Assistants and Nurse Practitioners) who all work together to provide you with the care you need, when you need it. You will need a follow up appointment in 4 weeks.  Please call our office 2 months in advance to schedule this appointment.  You may see [name], MD or another member of our [place] Provider Team in [place]: Shirlee More, MD . Jyl Heinz, MD  Any Other Special Instructions Will Be Listed Below (If Applicable).

## 2018-05-13 LAB — BASIC METABOLIC PANEL
BUN / CREAT RATIO: 16 (ref 12–28)
BUN: 14 mg/dL (ref 8–27)
CO2: 24 mmol/L (ref 20–29)
CREATININE: 0.9 mg/dL (ref 0.57–1.00)
Calcium: 9.1 mg/dL (ref 8.7–10.3)
Chloride: 98 mmol/L (ref 96–106)
GFR calc non Af Amer: 60 mL/min/{1.73_m2} (ref >59)
GFR, EST AFRICAN AMERICAN: 69 mL/min/{1.73_m2} (ref >59)
Glucose: 101 mg/dL — ABNORMAL HIGH (ref 65–99)
Potassium: 4.6 mmol/L (ref 3.5–5.2)
Sodium: 139 mmol/L (ref 134–144)

## 2018-05-13 LAB — PRO B NATRIURETIC PEPTIDE: NT-Pro BNP: 2040 pg/mL — ABNORMAL HIGH (ref 0–738)

## 2018-05-15 ENCOUNTER — Encounter: Payer: Self-pay | Admitting: Emergency Medicine

## 2018-05-15 DIAGNOSIS — Z8673 Personal history of transient ischemic attack (TIA), and cerebral infarction without residual deficits: Secondary | ICD-10-CM | POA: Diagnosis not present

## 2018-05-15 DIAGNOSIS — E871 Hypo-osmolality and hyponatremia: Secondary | ICD-10-CM | POA: Diagnosis not present

## 2018-05-15 DIAGNOSIS — I447 Left bundle-branch block, unspecified: Secondary | ICD-10-CM | POA: Diagnosis not present

## 2018-05-15 DIAGNOSIS — I471 Supraventricular tachycardia: Secondary | ICD-10-CM | POA: Diagnosis not present

## 2018-05-15 DIAGNOSIS — E875 Hyperkalemia: Secondary | ICD-10-CM | POA: Diagnosis not present

## 2018-05-15 DIAGNOSIS — I6523 Occlusion and stenosis of bilateral carotid arteries: Secondary | ICD-10-CM | POA: Diagnosis not present

## 2018-05-15 DIAGNOSIS — E876 Hypokalemia: Secondary | ICD-10-CM | POA: Diagnosis not present

## 2018-05-15 DIAGNOSIS — I272 Pulmonary hypertension, unspecified: Secondary | ICD-10-CM | POA: Diagnosis not present

## 2018-05-15 DIAGNOSIS — J449 Chronic obstructive pulmonary disease, unspecified: Secondary | ICD-10-CM | POA: Diagnosis not present

## 2018-05-15 DIAGNOSIS — J9621 Acute and chronic respiratory failure with hypoxia: Secondary | ICD-10-CM | POA: Diagnosis not present

## 2018-05-15 DIAGNOSIS — D509 Iron deficiency anemia, unspecified: Secondary | ICD-10-CM | POA: Diagnosis not present

## 2018-05-15 DIAGNOSIS — Z87891 Personal history of nicotine dependence: Secondary | ICD-10-CM | POA: Diagnosis not present

## 2018-05-15 DIAGNOSIS — Z9981 Dependence on supplemental oxygen: Secondary | ICD-10-CM | POA: Diagnosis not present

## 2018-05-15 DIAGNOSIS — H52511 Internal ophthalmoplegia (complete) (total), right eye: Secondary | ICD-10-CM | POA: Diagnosis not present

## 2018-05-15 DIAGNOSIS — D649 Anemia, unspecified: Secondary | ICD-10-CM | POA: Diagnosis not present

## 2018-05-15 DIAGNOSIS — S0231XS Fracture of orbital floor, right side, sequela: Secondary | ICD-10-CM | POA: Diagnosis not present

## 2018-05-15 DIAGNOSIS — Z7901 Long term (current) use of anticoagulants: Secondary | ICD-10-CM | POA: Diagnosis not present

## 2018-05-15 DIAGNOSIS — I5033 Acute on chronic diastolic (congestive) heart failure: Secondary | ICD-10-CM | POA: Diagnosis not present

## 2018-05-15 DIAGNOSIS — I110 Hypertensive heart disease with heart failure: Secondary | ICD-10-CM | POA: Diagnosis not present

## 2018-05-15 DIAGNOSIS — R296 Repeated falls: Secondary | ICD-10-CM | POA: Diagnosis not present

## 2018-05-15 DIAGNOSIS — H5461 Unqualified visual loss, right eye, normal vision left eye: Secondary | ICD-10-CM | POA: Diagnosis not present

## 2018-05-15 DIAGNOSIS — I2510 Atherosclerotic heart disease of native coronary artery without angina pectoris: Secondary | ICD-10-CM | POA: Diagnosis not present

## 2018-05-15 DIAGNOSIS — Z9181 History of falling: Secondary | ICD-10-CM | POA: Diagnosis not present

## 2018-05-15 DIAGNOSIS — I482 Chronic atrial fibrillation, unspecified: Secondary | ICD-10-CM | POA: Diagnosis not present

## 2018-05-17 DIAGNOSIS — E1165 Type 2 diabetes mellitus with hyperglycemia: Secondary | ICD-10-CM | POA: Diagnosis not present

## 2018-05-17 DIAGNOSIS — R0602 Shortness of breath: Secondary | ICD-10-CM | POA: Diagnosis not present

## 2018-05-17 DIAGNOSIS — J159 Unspecified bacterial pneumonia: Secondary | ICD-10-CM | POA: Diagnosis not present

## 2018-05-17 DIAGNOSIS — D638 Anemia in other chronic diseases classified elsewhere: Secondary | ICD-10-CM | POA: Diagnosis not present

## 2018-05-17 DIAGNOSIS — Z888 Allergy status to other drugs, medicaments and biological substances status: Secondary | ICD-10-CM | POA: Diagnosis not present

## 2018-05-17 DIAGNOSIS — Z9181 History of falling: Secondary | ICD-10-CM | POA: Diagnosis not present

## 2018-05-17 DIAGNOSIS — J449 Chronic obstructive pulmonary disease, unspecified: Secondary | ICD-10-CM | POA: Diagnosis not present

## 2018-05-17 DIAGNOSIS — I480 Paroxysmal atrial fibrillation: Secondary | ICD-10-CM | POA: Diagnosis not present

## 2018-05-17 DIAGNOSIS — E871 Hypo-osmolality and hyponatremia: Secondary | ICD-10-CM | POA: Diagnosis not present

## 2018-05-17 DIAGNOSIS — I959 Hypotension, unspecified: Secondary | ICD-10-CM | POA: Diagnosis not present

## 2018-05-17 DIAGNOSIS — I6523 Occlusion and stenosis of bilateral carotid arteries: Secondary | ICD-10-CM | POA: Diagnosis not present

## 2018-05-17 DIAGNOSIS — Z9981 Dependence on supplemental oxygen: Secondary | ICD-10-CM | POA: Diagnosis not present

## 2018-05-17 DIAGNOSIS — I272 Pulmonary hypertension, unspecified: Secondary | ICD-10-CM | POA: Diagnosis not present

## 2018-05-17 DIAGNOSIS — I4891 Unspecified atrial fibrillation: Secondary | ICD-10-CM | POA: Diagnosis not present

## 2018-05-17 DIAGNOSIS — H52511 Internal ophthalmoplegia (complete) (total), right eye: Secondary | ICD-10-CM | POA: Diagnosis not present

## 2018-05-17 DIAGNOSIS — I447 Left bundle-branch block, unspecified: Secondary | ICD-10-CM | POA: Diagnosis not present

## 2018-05-17 DIAGNOSIS — R7989 Other specified abnormal findings of blood chemistry: Secondary | ICD-10-CM | POA: Diagnosis not present

## 2018-05-17 DIAGNOSIS — I2510 Atherosclerotic heart disease of native coronary artery without angina pectoris: Secondary | ICD-10-CM | POA: Diagnosis not present

## 2018-05-17 DIAGNOSIS — R296 Repeated falls: Secondary | ICD-10-CM | POA: Diagnosis not present

## 2018-05-17 DIAGNOSIS — D649 Anemia, unspecified: Secondary | ICD-10-CM | POA: Diagnosis not present

## 2018-05-17 DIAGNOSIS — I509 Heart failure, unspecified: Secondary | ICD-10-CM | POA: Diagnosis not present

## 2018-05-17 DIAGNOSIS — I10 Essential (primary) hypertension: Secondary | ICD-10-CM | POA: Diagnosis not present

## 2018-05-17 DIAGNOSIS — A419 Sepsis, unspecified organism: Secondary | ICD-10-CM | POA: Diagnosis not present

## 2018-05-17 DIAGNOSIS — E876 Hypokalemia: Secondary | ICD-10-CM | POA: Diagnosis not present

## 2018-05-17 DIAGNOSIS — I482 Chronic atrial fibrillation, unspecified: Secondary | ICD-10-CM | POA: Diagnosis not present

## 2018-05-17 DIAGNOSIS — I110 Hypertensive heart disease with heart failure: Secondary | ICD-10-CM | POA: Diagnosis not present

## 2018-05-17 DIAGNOSIS — Z885 Allergy status to narcotic agent status: Secondary | ICD-10-CM | POA: Diagnosis not present

## 2018-05-17 DIAGNOSIS — Z87891 Personal history of nicotine dependence: Secondary | ICD-10-CM | POA: Diagnosis not present

## 2018-05-17 DIAGNOSIS — Z8673 Personal history of transient ischemic attack (TIA), and cerebral infarction without residual deficits: Secondary | ICD-10-CM | POA: Diagnosis not present

## 2018-05-17 DIAGNOSIS — I5023 Acute on chronic systolic (congestive) heart failure: Secondary | ICD-10-CM | POA: Diagnosis not present

## 2018-05-17 DIAGNOSIS — Z880 Allergy status to penicillin: Secondary | ICD-10-CM | POA: Diagnosis not present

## 2018-05-17 DIAGNOSIS — R0689 Other abnormalities of breathing: Secondary | ICD-10-CM | POA: Diagnosis not present

## 2018-05-17 DIAGNOSIS — Z79899 Other long term (current) drug therapy: Secondary | ICD-10-CM | POA: Diagnosis not present

## 2018-05-17 DIAGNOSIS — I5033 Acute on chronic diastolic (congestive) heart failure: Secondary | ICD-10-CM | POA: Diagnosis not present

## 2018-05-17 DIAGNOSIS — Z7901 Long term (current) use of anticoagulants: Secondary | ICD-10-CM | POA: Diagnosis not present

## 2018-05-17 DIAGNOSIS — J9601 Acute respiratory failure with hypoxia: Secondary | ICD-10-CM | POA: Diagnosis not present

## 2018-05-17 DIAGNOSIS — H5461 Unqualified visual loss, right eye, normal vision left eye: Secondary | ICD-10-CM | POA: Diagnosis not present

## 2018-05-17 DIAGNOSIS — J9621 Acute and chronic respiratory failure with hypoxia: Secondary | ICD-10-CM | POA: Diagnosis not present

## 2018-05-17 DIAGNOSIS — R799 Abnormal finding of blood chemistry, unspecified: Secondary | ICD-10-CM

## 2018-05-17 DIAGNOSIS — J440 Chronic obstructive pulmonary disease with acute lower respiratory infection: Secondary | ICD-10-CM | POA: Diagnosis not present

## 2018-05-17 DIAGNOSIS — K219 Gastro-esophageal reflux disease without esophagitis: Secondary | ICD-10-CM | POA: Diagnosis not present

## 2018-05-17 DIAGNOSIS — Z743 Need for continuous supervision: Secondary | ICD-10-CM | POA: Diagnosis not present

## 2018-05-17 DIAGNOSIS — I471 Supraventricular tachycardia: Secondary | ICD-10-CM | POA: Diagnosis not present

## 2018-05-17 DIAGNOSIS — S0231XS Fracture of orbital floor, right side, sequela: Secondary | ICD-10-CM | POA: Diagnosis not present

## 2018-05-17 DIAGNOSIS — E119 Type 2 diabetes mellitus without complications: Secondary | ICD-10-CM | POA: Diagnosis not present

## 2018-05-19 ENCOUNTER — Ambulatory Visit: Payer: Medicare Other | Admitting: Cardiology

## 2018-05-25 DIAGNOSIS — J449 Chronic obstructive pulmonary disease, unspecified: Secondary | ICD-10-CM | POA: Diagnosis not present

## 2018-05-25 DIAGNOSIS — I447 Left bundle-branch block, unspecified: Secondary | ICD-10-CM | POA: Diagnosis not present

## 2018-05-25 DIAGNOSIS — D649 Anemia, unspecified: Secondary | ICD-10-CM | POA: Diagnosis not present

## 2018-05-25 DIAGNOSIS — Z7901 Long term (current) use of anticoagulants: Secondary | ICD-10-CM | POA: Diagnosis not present

## 2018-05-25 DIAGNOSIS — J9621 Acute and chronic respiratory failure with hypoxia: Secondary | ICD-10-CM | POA: Diagnosis not present

## 2018-05-25 DIAGNOSIS — E871 Hypo-osmolality and hyponatremia: Secondary | ICD-10-CM | POA: Diagnosis not present

## 2018-05-25 DIAGNOSIS — I5033 Acute on chronic diastolic (congestive) heart failure: Secondary | ICD-10-CM | POA: Diagnosis not present

## 2018-05-25 DIAGNOSIS — I2510 Atherosclerotic heart disease of native coronary artery without angina pectoris: Secondary | ICD-10-CM | POA: Diagnosis not present

## 2018-05-25 DIAGNOSIS — I482 Chronic atrial fibrillation, unspecified: Secondary | ICD-10-CM | POA: Diagnosis not present

## 2018-05-25 DIAGNOSIS — I471 Supraventricular tachycardia: Secondary | ICD-10-CM | POA: Diagnosis not present

## 2018-05-25 DIAGNOSIS — Z9981 Dependence on supplemental oxygen: Secondary | ICD-10-CM | POA: Diagnosis not present

## 2018-05-25 DIAGNOSIS — I272 Pulmonary hypertension, unspecified: Secondary | ICD-10-CM | POA: Diagnosis not present

## 2018-05-25 DIAGNOSIS — Z8673 Personal history of transient ischemic attack (TIA), and cerebral infarction without residual deficits: Secondary | ICD-10-CM | POA: Diagnosis not present

## 2018-05-25 DIAGNOSIS — E876 Hypokalemia: Secondary | ICD-10-CM | POA: Diagnosis not present

## 2018-05-25 DIAGNOSIS — Z9181 History of falling: Secondary | ICD-10-CM | POA: Diagnosis not present

## 2018-05-25 DIAGNOSIS — R296 Repeated falls: Secondary | ICD-10-CM | POA: Diagnosis not present

## 2018-05-25 DIAGNOSIS — S0231XS Fracture of orbital floor, right side, sequela: Secondary | ICD-10-CM | POA: Diagnosis not present

## 2018-05-25 DIAGNOSIS — Z87891 Personal history of nicotine dependence: Secondary | ICD-10-CM | POA: Diagnosis not present

## 2018-05-25 DIAGNOSIS — I6523 Occlusion and stenosis of bilateral carotid arteries: Secondary | ICD-10-CM | POA: Diagnosis not present

## 2018-05-25 DIAGNOSIS — I110 Hypertensive heart disease with heart failure: Secondary | ICD-10-CM | POA: Diagnosis not present

## 2018-05-25 DIAGNOSIS — H5461 Unqualified visual loss, right eye, normal vision left eye: Secondary | ICD-10-CM | POA: Diagnosis not present

## 2018-05-25 DIAGNOSIS — H52511 Internal ophthalmoplegia (complete) (total), right eye: Secondary | ICD-10-CM | POA: Diagnosis not present

## 2018-05-26 DIAGNOSIS — Z9181 History of falling: Secondary | ICD-10-CM | POA: Diagnosis not present

## 2018-05-26 DIAGNOSIS — Z9981 Dependence on supplemental oxygen: Secondary | ICD-10-CM | POA: Diagnosis not present

## 2018-05-26 DIAGNOSIS — I6523 Occlusion and stenosis of bilateral carotid arteries: Secondary | ICD-10-CM | POA: Diagnosis not present

## 2018-05-26 DIAGNOSIS — E871 Hypo-osmolality and hyponatremia: Secondary | ICD-10-CM | POA: Diagnosis not present

## 2018-05-26 DIAGNOSIS — Z87891 Personal history of nicotine dependence: Secondary | ICD-10-CM | POA: Diagnosis not present

## 2018-05-26 DIAGNOSIS — I482 Chronic atrial fibrillation, unspecified: Secondary | ICD-10-CM | POA: Diagnosis not present

## 2018-05-26 DIAGNOSIS — D649 Anemia, unspecified: Secondary | ICD-10-CM | POA: Diagnosis not present

## 2018-05-26 DIAGNOSIS — H5461 Unqualified visual loss, right eye, normal vision left eye: Secondary | ICD-10-CM | POA: Diagnosis not present

## 2018-05-26 DIAGNOSIS — Z7901 Long term (current) use of anticoagulants: Secondary | ICD-10-CM | POA: Diagnosis not present

## 2018-05-26 DIAGNOSIS — I447 Left bundle-branch block, unspecified: Secondary | ICD-10-CM | POA: Diagnosis not present

## 2018-05-26 DIAGNOSIS — R296 Repeated falls: Secondary | ICD-10-CM | POA: Diagnosis not present

## 2018-05-26 DIAGNOSIS — Z8673 Personal history of transient ischemic attack (TIA), and cerebral infarction without residual deficits: Secondary | ICD-10-CM | POA: Diagnosis not present

## 2018-05-26 DIAGNOSIS — I110 Hypertensive heart disease with heart failure: Secondary | ICD-10-CM | POA: Diagnosis not present

## 2018-05-26 DIAGNOSIS — I272 Pulmonary hypertension, unspecified: Secondary | ICD-10-CM | POA: Diagnosis not present

## 2018-05-26 DIAGNOSIS — I2510 Atherosclerotic heart disease of native coronary artery without angina pectoris: Secondary | ICD-10-CM | POA: Diagnosis not present

## 2018-05-26 DIAGNOSIS — J9621 Acute and chronic respiratory failure with hypoxia: Secondary | ICD-10-CM | POA: Diagnosis not present

## 2018-05-26 DIAGNOSIS — I471 Supraventricular tachycardia: Secondary | ICD-10-CM | POA: Diagnosis not present

## 2018-05-26 DIAGNOSIS — H52511 Internal ophthalmoplegia (complete) (total), right eye: Secondary | ICD-10-CM | POA: Diagnosis not present

## 2018-05-26 DIAGNOSIS — E876 Hypokalemia: Secondary | ICD-10-CM | POA: Diagnosis not present

## 2018-05-26 DIAGNOSIS — J449 Chronic obstructive pulmonary disease, unspecified: Secondary | ICD-10-CM | POA: Diagnosis not present

## 2018-05-26 DIAGNOSIS — S0231XS Fracture of orbital floor, right side, sequela: Secondary | ICD-10-CM | POA: Diagnosis not present

## 2018-05-26 DIAGNOSIS — I5033 Acute on chronic diastolic (congestive) heart failure: Secondary | ICD-10-CM | POA: Diagnosis not present

## 2018-05-28 DIAGNOSIS — I5031 Acute diastolic (congestive) heart failure: Secondary | ICD-10-CM | POA: Diagnosis not present

## 2018-05-29 DIAGNOSIS — E871 Hypo-osmolality and hyponatremia: Secondary | ICD-10-CM | POA: Diagnosis not present

## 2018-05-29 DIAGNOSIS — Z7901 Long term (current) use of anticoagulants: Secondary | ICD-10-CM | POA: Diagnosis not present

## 2018-05-29 DIAGNOSIS — Z8673 Personal history of transient ischemic attack (TIA), and cerebral infarction without residual deficits: Secondary | ICD-10-CM | POA: Diagnosis not present

## 2018-05-29 DIAGNOSIS — I471 Supraventricular tachycardia: Secondary | ICD-10-CM | POA: Diagnosis not present

## 2018-05-29 DIAGNOSIS — R296 Repeated falls: Secondary | ICD-10-CM | POA: Diagnosis not present

## 2018-05-29 DIAGNOSIS — I2510 Atherosclerotic heart disease of native coronary artery without angina pectoris: Secondary | ICD-10-CM | POA: Diagnosis not present

## 2018-05-29 DIAGNOSIS — E876 Hypokalemia: Secondary | ICD-10-CM | POA: Diagnosis not present

## 2018-05-29 DIAGNOSIS — S0231XS Fracture of orbital floor, right side, sequela: Secondary | ICD-10-CM | POA: Diagnosis not present

## 2018-05-29 DIAGNOSIS — I447 Left bundle-branch block, unspecified: Secondary | ICD-10-CM | POA: Diagnosis not present

## 2018-05-29 DIAGNOSIS — I272 Pulmonary hypertension, unspecified: Secondary | ICD-10-CM | POA: Diagnosis not present

## 2018-05-29 DIAGNOSIS — I6523 Occlusion and stenosis of bilateral carotid arteries: Secondary | ICD-10-CM | POA: Diagnosis not present

## 2018-05-29 DIAGNOSIS — H5461 Unqualified visual loss, right eye, normal vision left eye: Secondary | ICD-10-CM | POA: Diagnosis not present

## 2018-05-29 DIAGNOSIS — Z9981 Dependence on supplemental oxygen: Secondary | ICD-10-CM | POA: Diagnosis not present

## 2018-05-29 DIAGNOSIS — I5033 Acute on chronic diastolic (congestive) heart failure: Secondary | ICD-10-CM | POA: Diagnosis not present

## 2018-05-29 DIAGNOSIS — Z87891 Personal history of nicotine dependence: Secondary | ICD-10-CM | POA: Diagnosis not present

## 2018-05-29 DIAGNOSIS — J9621 Acute and chronic respiratory failure with hypoxia: Secondary | ICD-10-CM | POA: Diagnosis not present

## 2018-05-29 DIAGNOSIS — J449 Chronic obstructive pulmonary disease, unspecified: Secondary | ICD-10-CM | POA: Diagnosis not present

## 2018-05-29 DIAGNOSIS — I110 Hypertensive heart disease with heart failure: Secondary | ICD-10-CM | POA: Diagnosis not present

## 2018-05-29 DIAGNOSIS — H52511 Internal ophthalmoplegia (complete) (total), right eye: Secondary | ICD-10-CM | POA: Diagnosis not present

## 2018-05-29 DIAGNOSIS — D649 Anemia, unspecified: Secondary | ICD-10-CM | POA: Diagnosis not present

## 2018-05-29 DIAGNOSIS — Z9181 History of falling: Secondary | ICD-10-CM | POA: Diagnosis not present

## 2018-05-29 DIAGNOSIS — I482 Chronic atrial fibrillation, unspecified: Secondary | ICD-10-CM | POA: Diagnosis not present

## 2018-05-30 DIAGNOSIS — J449 Chronic obstructive pulmonary disease, unspecified: Secondary | ICD-10-CM | POA: Diagnosis not present

## 2018-05-30 DIAGNOSIS — H5461 Unqualified visual loss, right eye, normal vision left eye: Secondary | ICD-10-CM | POA: Diagnosis not present

## 2018-05-30 DIAGNOSIS — E876 Hypokalemia: Secondary | ICD-10-CM | POA: Diagnosis not present

## 2018-05-30 DIAGNOSIS — Z9981 Dependence on supplemental oxygen: Secondary | ICD-10-CM | POA: Diagnosis not present

## 2018-05-30 DIAGNOSIS — I5033 Acute on chronic diastolic (congestive) heart failure: Secondary | ICD-10-CM | POA: Diagnosis not present

## 2018-05-30 DIAGNOSIS — Z7901 Long term (current) use of anticoagulants: Secondary | ICD-10-CM | POA: Diagnosis not present

## 2018-05-30 DIAGNOSIS — I482 Chronic atrial fibrillation, unspecified: Secondary | ICD-10-CM | POA: Diagnosis not present

## 2018-05-30 DIAGNOSIS — R296 Repeated falls: Secondary | ICD-10-CM | POA: Diagnosis not present

## 2018-05-30 DIAGNOSIS — S0231XS Fracture of orbital floor, right side, sequela: Secondary | ICD-10-CM | POA: Diagnosis not present

## 2018-05-30 DIAGNOSIS — I6523 Occlusion and stenosis of bilateral carotid arteries: Secondary | ICD-10-CM | POA: Diagnosis not present

## 2018-05-30 DIAGNOSIS — J9621 Acute and chronic respiratory failure with hypoxia: Secondary | ICD-10-CM | POA: Diagnosis not present

## 2018-05-30 DIAGNOSIS — Z8673 Personal history of transient ischemic attack (TIA), and cerebral infarction without residual deficits: Secondary | ICD-10-CM | POA: Diagnosis not present

## 2018-05-30 DIAGNOSIS — H52511 Internal ophthalmoplegia (complete) (total), right eye: Secondary | ICD-10-CM | POA: Diagnosis not present

## 2018-05-30 DIAGNOSIS — D649 Anemia, unspecified: Secondary | ICD-10-CM | POA: Diagnosis not present

## 2018-05-30 DIAGNOSIS — I110 Hypertensive heart disease with heart failure: Secondary | ICD-10-CM | POA: Diagnosis not present

## 2018-05-30 DIAGNOSIS — I272 Pulmonary hypertension, unspecified: Secondary | ICD-10-CM | POA: Diagnosis not present

## 2018-05-30 DIAGNOSIS — Z9181 History of falling: Secondary | ICD-10-CM | POA: Diagnosis not present

## 2018-05-30 DIAGNOSIS — E871 Hypo-osmolality and hyponatremia: Secondary | ICD-10-CM | POA: Diagnosis not present

## 2018-05-30 DIAGNOSIS — I447 Left bundle-branch block, unspecified: Secondary | ICD-10-CM | POA: Diagnosis not present

## 2018-05-30 DIAGNOSIS — Z87891 Personal history of nicotine dependence: Secondary | ICD-10-CM | POA: Diagnosis not present

## 2018-05-30 DIAGNOSIS — I2510 Atherosclerotic heart disease of native coronary artery without angina pectoris: Secondary | ICD-10-CM | POA: Diagnosis not present

## 2018-05-30 DIAGNOSIS — I471 Supraventricular tachycardia: Secondary | ICD-10-CM | POA: Diagnosis not present

## 2018-06-01 DIAGNOSIS — Z8673 Personal history of transient ischemic attack (TIA), and cerebral infarction without residual deficits: Secondary | ICD-10-CM | POA: Diagnosis not present

## 2018-06-01 DIAGNOSIS — E871 Hypo-osmolality and hyponatremia: Secondary | ICD-10-CM | POA: Diagnosis not present

## 2018-06-01 DIAGNOSIS — J449 Chronic obstructive pulmonary disease, unspecified: Secondary | ICD-10-CM | POA: Diagnosis not present

## 2018-06-01 DIAGNOSIS — J9621 Acute and chronic respiratory failure with hypoxia: Secondary | ICD-10-CM | POA: Diagnosis not present

## 2018-06-01 DIAGNOSIS — H52511 Internal ophthalmoplegia (complete) (total), right eye: Secondary | ICD-10-CM | POA: Diagnosis not present

## 2018-06-01 DIAGNOSIS — Z7901 Long term (current) use of anticoagulants: Secondary | ICD-10-CM | POA: Diagnosis not present

## 2018-06-01 DIAGNOSIS — I2510 Atherosclerotic heart disease of native coronary artery without angina pectoris: Secondary | ICD-10-CM | POA: Diagnosis not present

## 2018-06-01 DIAGNOSIS — I447 Left bundle-branch block, unspecified: Secondary | ICD-10-CM | POA: Diagnosis not present

## 2018-06-01 DIAGNOSIS — I110 Hypertensive heart disease with heart failure: Secondary | ICD-10-CM | POA: Diagnosis not present

## 2018-06-01 DIAGNOSIS — S0231XS Fracture of orbital floor, right side, sequela: Secondary | ICD-10-CM | POA: Diagnosis not present

## 2018-06-01 DIAGNOSIS — Z9981 Dependence on supplemental oxygen: Secondary | ICD-10-CM | POA: Diagnosis not present

## 2018-06-01 DIAGNOSIS — D649 Anemia, unspecified: Secondary | ICD-10-CM | POA: Diagnosis not present

## 2018-06-01 DIAGNOSIS — Z87891 Personal history of nicotine dependence: Secondary | ICD-10-CM | POA: Diagnosis not present

## 2018-06-01 DIAGNOSIS — I6523 Occlusion and stenosis of bilateral carotid arteries: Secondary | ICD-10-CM | POA: Diagnosis not present

## 2018-06-01 DIAGNOSIS — H5461 Unqualified visual loss, right eye, normal vision left eye: Secondary | ICD-10-CM | POA: Diagnosis not present

## 2018-06-01 DIAGNOSIS — Z9181 History of falling: Secondary | ICD-10-CM | POA: Diagnosis not present

## 2018-06-01 DIAGNOSIS — I471 Supraventricular tachycardia: Secondary | ICD-10-CM | POA: Diagnosis not present

## 2018-06-01 DIAGNOSIS — R296 Repeated falls: Secondary | ICD-10-CM | POA: Diagnosis not present

## 2018-06-01 DIAGNOSIS — I272 Pulmonary hypertension, unspecified: Secondary | ICD-10-CM | POA: Diagnosis not present

## 2018-06-01 DIAGNOSIS — E876 Hypokalemia: Secondary | ICD-10-CM | POA: Diagnosis not present

## 2018-06-01 DIAGNOSIS — I5033 Acute on chronic diastolic (congestive) heart failure: Secondary | ICD-10-CM | POA: Diagnosis not present

## 2018-06-01 DIAGNOSIS — I482 Chronic atrial fibrillation, unspecified: Secondary | ICD-10-CM | POA: Diagnosis not present

## 2018-06-02 DIAGNOSIS — I5033 Acute on chronic diastolic (congestive) heart failure: Secondary | ICD-10-CM | POA: Diagnosis not present

## 2018-06-02 DIAGNOSIS — J189 Pneumonia, unspecified organism: Secondary | ICD-10-CM | POA: Diagnosis not present

## 2018-06-02 DIAGNOSIS — E039 Hypothyroidism, unspecified: Secondary | ICD-10-CM | POA: Diagnosis not present

## 2018-06-02 DIAGNOSIS — J441 Chronic obstructive pulmonary disease with (acute) exacerbation: Secondary | ICD-10-CM | POA: Diagnosis not present

## 2018-06-05 DIAGNOSIS — J9611 Chronic respiratory failure with hypoxia: Secondary | ICD-10-CM | POA: Diagnosis not present

## 2018-06-05 DIAGNOSIS — J449 Chronic obstructive pulmonary disease, unspecified: Secondary | ICD-10-CM | POA: Diagnosis not present

## 2018-06-05 DIAGNOSIS — I110 Hypertensive heart disease with heart failure: Secondary | ICD-10-CM | POA: Diagnosis not present

## 2018-06-05 DIAGNOSIS — J9621 Acute and chronic respiratory failure with hypoxia: Secondary | ICD-10-CM | POA: Diagnosis not present

## 2018-06-05 DIAGNOSIS — E871 Hypo-osmolality and hyponatremia: Secondary | ICD-10-CM | POA: Diagnosis not present

## 2018-06-05 DIAGNOSIS — J301 Allergic rhinitis due to pollen: Secondary | ICD-10-CM | POA: Diagnosis not present

## 2018-06-05 DIAGNOSIS — H5461 Unqualified visual loss, right eye, normal vision left eye: Secondary | ICD-10-CM | POA: Diagnosis not present

## 2018-06-05 DIAGNOSIS — D649 Anemia, unspecified: Secondary | ICD-10-CM | POA: Diagnosis not present

## 2018-06-05 DIAGNOSIS — I447 Left bundle-branch block, unspecified: Secondary | ICD-10-CM | POA: Diagnosis not present

## 2018-06-05 DIAGNOSIS — G4733 Obstructive sleep apnea (adult) (pediatric): Secondary | ICD-10-CM | POA: Diagnosis not present

## 2018-06-05 DIAGNOSIS — I6523 Occlusion and stenosis of bilateral carotid arteries: Secondary | ICD-10-CM | POA: Diagnosis not present

## 2018-06-05 DIAGNOSIS — E876 Hypokalemia: Secondary | ICD-10-CM | POA: Diagnosis not present

## 2018-06-05 DIAGNOSIS — I272 Pulmonary hypertension, unspecified: Secondary | ICD-10-CM | POA: Diagnosis not present

## 2018-06-05 DIAGNOSIS — Z9181 History of falling: Secondary | ICD-10-CM | POA: Diagnosis not present

## 2018-06-05 DIAGNOSIS — Z9981 Dependence on supplemental oxygen: Secondary | ICD-10-CM | POA: Diagnosis not present

## 2018-06-05 DIAGNOSIS — Z87891 Personal history of nicotine dependence: Secondary | ICD-10-CM | POA: Diagnosis not present

## 2018-06-05 DIAGNOSIS — H52511 Internal ophthalmoplegia (complete) (total), right eye: Secondary | ICD-10-CM | POA: Diagnosis not present

## 2018-06-05 DIAGNOSIS — Z8673 Personal history of transient ischemic attack (TIA), and cerebral infarction without residual deficits: Secondary | ICD-10-CM | POA: Diagnosis not present

## 2018-06-05 DIAGNOSIS — I2510 Atherosclerotic heart disease of native coronary artery without angina pectoris: Secondary | ICD-10-CM | POA: Diagnosis not present

## 2018-06-05 DIAGNOSIS — I482 Chronic atrial fibrillation, unspecified: Secondary | ICD-10-CM | POA: Diagnosis not present

## 2018-06-05 DIAGNOSIS — I471 Supraventricular tachycardia: Secondary | ICD-10-CM | POA: Diagnosis not present

## 2018-06-05 DIAGNOSIS — R296 Repeated falls: Secondary | ICD-10-CM | POA: Diagnosis not present

## 2018-06-05 DIAGNOSIS — Z7901 Long term (current) use of anticoagulants: Secondary | ICD-10-CM | POA: Diagnosis not present

## 2018-06-05 DIAGNOSIS — R918 Other nonspecific abnormal finding of lung field: Secondary | ICD-10-CM | POA: Diagnosis not present

## 2018-06-05 DIAGNOSIS — S0231XS Fracture of orbital floor, right side, sequela: Secondary | ICD-10-CM | POA: Diagnosis not present

## 2018-06-05 DIAGNOSIS — I5033 Acute on chronic diastolic (congestive) heart failure: Secondary | ICD-10-CM | POA: Diagnosis not present

## 2018-06-07 DIAGNOSIS — Z8673 Personal history of transient ischemic attack (TIA), and cerebral infarction without residual deficits: Secondary | ICD-10-CM | POA: Diagnosis not present

## 2018-06-07 DIAGNOSIS — I110 Hypertensive heart disease with heart failure: Secondary | ICD-10-CM | POA: Diagnosis not present

## 2018-06-07 DIAGNOSIS — I471 Supraventricular tachycardia: Secondary | ICD-10-CM | POA: Diagnosis not present

## 2018-06-07 DIAGNOSIS — I2510 Atherosclerotic heart disease of native coronary artery without angina pectoris: Secondary | ICD-10-CM | POA: Diagnosis not present

## 2018-06-07 DIAGNOSIS — R296 Repeated falls: Secondary | ICD-10-CM | POA: Diagnosis not present

## 2018-06-07 DIAGNOSIS — I6523 Occlusion and stenosis of bilateral carotid arteries: Secondary | ICD-10-CM | POA: Diagnosis not present

## 2018-06-07 DIAGNOSIS — I5033 Acute on chronic diastolic (congestive) heart failure: Secondary | ICD-10-CM | POA: Diagnosis not present

## 2018-06-07 DIAGNOSIS — J9621 Acute and chronic respiratory failure with hypoxia: Secondary | ICD-10-CM | POA: Diagnosis not present

## 2018-06-07 DIAGNOSIS — D649 Anemia, unspecified: Secondary | ICD-10-CM | POA: Diagnosis not present

## 2018-06-07 DIAGNOSIS — Z9981 Dependence on supplemental oxygen: Secondary | ICD-10-CM | POA: Diagnosis not present

## 2018-06-07 DIAGNOSIS — J449 Chronic obstructive pulmonary disease, unspecified: Secondary | ICD-10-CM | POA: Diagnosis not present

## 2018-06-07 DIAGNOSIS — Z87891 Personal history of nicotine dependence: Secondary | ICD-10-CM | POA: Diagnosis not present

## 2018-06-07 DIAGNOSIS — S0231XS Fracture of orbital floor, right side, sequela: Secondary | ICD-10-CM | POA: Diagnosis not present

## 2018-06-07 DIAGNOSIS — E876 Hypokalemia: Secondary | ICD-10-CM | POA: Diagnosis not present

## 2018-06-07 DIAGNOSIS — Z9181 History of falling: Secondary | ICD-10-CM | POA: Diagnosis not present

## 2018-06-07 DIAGNOSIS — I482 Chronic atrial fibrillation, unspecified: Secondary | ICD-10-CM | POA: Diagnosis not present

## 2018-06-07 DIAGNOSIS — Z7901 Long term (current) use of anticoagulants: Secondary | ICD-10-CM | POA: Diagnosis not present

## 2018-06-07 DIAGNOSIS — H5461 Unqualified visual loss, right eye, normal vision left eye: Secondary | ICD-10-CM | POA: Diagnosis not present

## 2018-06-07 DIAGNOSIS — I447 Left bundle-branch block, unspecified: Secondary | ICD-10-CM | POA: Diagnosis not present

## 2018-06-07 DIAGNOSIS — H52511 Internal ophthalmoplegia (complete) (total), right eye: Secondary | ICD-10-CM | POA: Diagnosis not present

## 2018-06-07 DIAGNOSIS — E871 Hypo-osmolality and hyponatremia: Secondary | ICD-10-CM | POA: Diagnosis not present

## 2018-06-07 DIAGNOSIS — I272 Pulmonary hypertension, unspecified: Secondary | ICD-10-CM | POA: Diagnosis not present

## 2018-06-08 DIAGNOSIS — J918 Pleural effusion in other conditions classified elsewhere: Secondary | ICD-10-CM | POA: Diagnosis not present

## 2018-06-08 DIAGNOSIS — J9611 Chronic respiratory failure with hypoxia: Secondary | ICD-10-CM | POA: Diagnosis not present

## 2018-06-08 DIAGNOSIS — J301 Allergic rhinitis due to pollen: Secondary | ICD-10-CM | POA: Diagnosis not present

## 2018-06-09 ENCOUNTER — Ambulatory Visit: Payer: Medicare Other | Admitting: Cardiology

## 2018-06-13 DIAGNOSIS — J449 Chronic obstructive pulmonary disease, unspecified: Secondary | ICD-10-CM | POA: Diagnosis not present

## 2018-06-13 DIAGNOSIS — J9621 Acute and chronic respiratory failure with hypoxia: Secondary | ICD-10-CM | POA: Diagnosis not present

## 2018-06-15 DIAGNOSIS — J449 Chronic obstructive pulmonary disease, unspecified: Secondary | ICD-10-CM | POA: Diagnosis not present

## 2018-06-15 DIAGNOSIS — H5461 Unqualified visual loss, right eye, normal vision left eye: Secondary | ICD-10-CM | POA: Diagnosis not present

## 2018-06-15 DIAGNOSIS — I2510 Atherosclerotic heart disease of native coronary artery without angina pectoris: Secondary | ICD-10-CM | POA: Diagnosis not present

## 2018-06-15 DIAGNOSIS — J9621 Acute and chronic respiratory failure with hypoxia: Secondary | ICD-10-CM | POA: Diagnosis not present

## 2018-06-15 DIAGNOSIS — Z87891 Personal history of nicotine dependence: Secondary | ICD-10-CM | POA: Diagnosis not present

## 2018-06-15 DIAGNOSIS — E871 Hypo-osmolality and hyponatremia: Secondary | ICD-10-CM | POA: Diagnosis not present

## 2018-06-15 DIAGNOSIS — I6523 Occlusion and stenosis of bilateral carotid arteries: Secondary | ICD-10-CM | POA: Diagnosis not present

## 2018-06-15 DIAGNOSIS — D649 Anemia, unspecified: Secondary | ICD-10-CM | POA: Diagnosis not present

## 2018-06-15 DIAGNOSIS — Z9181 History of falling: Secondary | ICD-10-CM | POA: Diagnosis not present

## 2018-06-15 DIAGNOSIS — R296 Repeated falls: Secondary | ICD-10-CM | POA: Diagnosis not present

## 2018-06-15 DIAGNOSIS — I471 Supraventricular tachycardia: Secondary | ICD-10-CM | POA: Diagnosis not present

## 2018-06-15 DIAGNOSIS — I5033 Acute on chronic diastolic (congestive) heart failure: Secondary | ICD-10-CM | POA: Diagnosis not present

## 2018-06-15 DIAGNOSIS — I447 Left bundle-branch block, unspecified: Secondary | ICD-10-CM | POA: Diagnosis not present

## 2018-06-15 DIAGNOSIS — I482 Chronic atrial fibrillation, unspecified: Secondary | ICD-10-CM | POA: Diagnosis not present

## 2018-06-15 DIAGNOSIS — S0231XS Fracture of orbital floor, right side, sequela: Secondary | ICD-10-CM | POA: Diagnosis not present

## 2018-06-15 DIAGNOSIS — E876 Hypokalemia: Secondary | ICD-10-CM | POA: Diagnosis not present

## 2018-06-15 DIAGNOSIS — H52511 Internal ophthalmoplegia (complete) (total), right eye: Secondary | ICD-10-CM | POA: Diagnosis not present

## 2018-06-15 DIAGNOSIS — Z7901 Long term (current) use of anticoagulants: Secondary | ICD-10-CM | POA: Diagnosis not present

## 2018-06-15 DIAGNOSIS — I110 Hypertensive heart disease with heart failure: Secondary | ICD-10-CM | POA: Diagnosis not present

## 2018-06-15 DIAGNOSIS — I272 Pulmonary hypertension, unspecified: Secondary | ICD-10-CM | POA: Diagnosis not present

## 2018-06-15 DIAGNOSIS — Z8673 Personal history of transient ischemic attack (TIA), and cerebral infarction without residual deficits: Secondary | ICD-10-CM | POA: Diagnosis not present

## 2018-06-15 DIAGNOSIS — Z9981 Dependence on supplemental oxygen: Secondary | ICD-10-CM | POA: Diagnosis not present

## 2018-06-16 DIAGNOSIS — I517 Cardiomegaly: Secondary | ICD-10-CM | POA: Diagnosis not present

## 2018-06-16 DIAGNOSIS — J8410 Pulmonary fibrosis, unspecified: Secondary | ICD-10-CM | POA: Diagnosis not present

## 2018-06-16 DIAGNOSIS — R918 Other nonspecific abnormal finding of lung field: Secondary | ICD-10-CM | POA: Diagnosis not present

## 2018-06-16 DIAGNOSIS — I447 Left bundle-branch block, unspecified: Secondary | ICD-10-CM | POA: Diagnosis not present

## 2018-06-16 DIAGNOSIS — I440 Atrioventricular block, first degree: Secondary | ICD-10-CM | POA: Diagnosis not present

## 2018-06-16 DIAGNOSIS — R0902 Hypoxemia: Secondary | ICD-10-CM | POA: Diagnosis not present

## 2018-06-16 DIAGNOSIS — R9431 Abnormal electrocardiogram [ECG] [EKG]: Secondary | ICD-10-CM | POA: Diagnosis not present

## 2018-06-16 DIAGNOSIS — I700 Atherosclerosis of aorta: Secondary | ICD-10-CM | POA: Diagnosis not present

## 2018-06-16 DIAGNOSIS — J9611 Chronic respiratory failure with hypoxia: Secondary | ICD-10-CM | POA: Diagnosis not present

## 2018-06-16 DIAGNOSIS — R846 Abnormal cytological findings in specimens from respiratory organs and thorax: Secondary | ICD-10-CM | POA: Diagnosis not present

## 2018-06-16 DIAGNOSIS — Z87891 Personal history of nicotine dependence: Secondary | ICD-10-CM | POA: Diagnosis not present

## 2018-06-19 DIAGNOSIS — I272 Pulmonary hypertension, unspecified: Secondary | ICD-10-CM | POA: Diagnosis not present

## 2018-06-19 DIAGNOSIS — J9621 Acute and chronic respiratory failure with hypoxia: Secondary | ICD-10-CM | POA: Diagnosis not present

## 2018-06-19 DIAGNOSIS — H5461 Unqualified visual loss, right eye, normal vision left eye: Secondary | ICD-10-CM | POA: Diagnosis not present

## 2018-06-19 DIAGNOSIS — Z9981 Dependence on supplemental oxygen: Secondary | ICD-10-CM | POA: Diagnosis not present

## 2018-06-19 DIAGNOSIS — I447 Left bundle-branch block, unspecified: Secondary | ICD-10-CM | POA: Diagnosis not present

## 2018-06-19 DIAGNOSIS — I471 Supraventricular tachycardia: Secondary | ICD-10-CM | POA: Diagnosis not present

## 2018-06-19 DIAGNOSIS — D649 Anemia, unspecified: Secondary | ICD-10-CM | POA: Diagnosis not present

## 2018-06-19 DIAGNOSIS — Z7901 Long term (current) use of anticoagulants: Secondary | ICD-10-CM | POA: Diagnosis not present

## 2018-06-19 DIAGNOSIS — I5033 Acute on chronic diastolic (congestive) heart failure: Secondary | ICD-10-CM | POA: Diagnosis not present

## 2018-06-19 DIAGNOSIS — S0231XS Fracture of orbital floor, right side, sequela: Secondary | ICD-10-CM | POA: Diagnosis not present

## 2018-06-19 DIAGNOSIS — I110 Hypertensive heart disease with heart failure: Secondary | ICD-10-CM | POA: Diagnosis not present

## 2018-06-19 DIAGNOSIS — E871 Hypo-osmolality and hyponatremia: Secondary | ICD-10-CM | POA: Diagnosis not present

## 2018-06-19 DIAGNOSIS — R296 Repeated falls: Secondary | ICD-10-CM | POA: Diagnosis not present

## 2018-06-19 DIAGNOSIS — E876 Hypokalemia: Secondary | ICD-10-CM | POA: Diagnosis not present

## 2018-06-19 DIAGNOSIS — I2510 Atherosclerotic heart disease of native coronary artery without angina pectoris: Secondary | ICD-10-CM | POA: Diagnosis not present

## 2018-06-19 DIAGNOSIS — Z87891 Personal history of nicotine dependence: Secondary | ICD-10-CM | POA: Diagnosis not present

## 2018-06-19 DIAGNOSIS — I6523 Occlusion and stenosis of bilateral carotid arteries: Secondary | ICD-10-CM | POA: Diagnosis not present

## 2018-06-19 DIAGNOSIS — H52511 Internal ophthalmoplegia (complete) (total), right eye: Secondary | ICD-10-CM | POA: Diagnosis not present

## 2018-06-19 DIAGNOSIS — Z9181 History of falling: Secondary | ICD-10-CM | POA: Diagnosis not present

## 2018-06-19 DIAGNOSIS — J449 Chronic obstructive pulmonary disease, unspecified: Secondary | ICD-10-CM | POA: Diagnosis not present

## 2018-06-19 DIAGNOSIS — I482 Chronic atrial fibrillation, unspecified: Secondary | ICD-10-CM | POA: Diagnosis not present

## 2018-06-19 DIAGNOSIS — Z8673 Personal history of transient ischemic attack (TIA), and cerebral infarction without residual deficits: Secondary | ICD-10-CM | POA: Diagnosis not present

## 2018-06-20 DIAGNOSIS — E876 Hypokalemia: Secondary | ICD-10-CM | POA: Diagnosis not present

## 2018-06-20 DIAGNOSIS — I5033 Acute on chronic diastolic (congestive) heart failure: Secondary | ICD-10-CM | POA: Diagnosis not present

## 2018-06-20 DIAGNOSIS — Z87891 Personal history of nicotine dependence: Secondary | ICD-10-CM | POA: Diagnosis not present

## 2018-06-20 DIAGNOSIS — I272 Pulmonary hypertension, unspecified: Secondary | ICD-10-CM | POA: Diagnosis not present

## 2018-06-20 DIAGNOSIS — Z9181 History of falling: Secondary | ICD-10-CM | POA: Diagnosis not present

## 2018-06-20 DIAGNOSIS — I110 Hypertensive heart disease with heart failure: Secondary | ICD-10-CM | POA: Diagnosis not present

## 2018-06-20 DIAGNOSIS — J449 Chronic obstructive pulmonary disease, unspecified: Secondary | ICD-10-CM | POA: Diagnosis not present

## 2018-06-20 DIAGNOSIS — I471 Supraventricular tachycardia: Secondary | ICD-10-CM | POA: Diagnosis not present

## 2018-06-20 DIAGNOSIS — S0231XS Fracture of orbital floor, right side, sequela: Secondary | ICD-10-CM | POA: Diagnosis not present

## 2018-06-20 DIAGNOSIS — I482 Chronic atrial fibrillation, unspecified: Secondary | ICD-10-CM | POA: Diagnosis not present

## 2018-06-20 DIAGNOSIS — E871 Hypo-osmolality and hyponatremia: Secondary | ICD-10-CM | POA: Diagnosis not present

## 2018-06-20 DIAGNOSIS — I447 Left bundle-branch block, unspecified: Secondary | ICD-10-CM | POA: Diagnosis not present

## 2018-06-20 DIAGNOSIS — H52511 Internal ophthalmoplegia (complete) (total), right eye: Secondary | ICD-10-CM | POA: Diagnosis not present

## 2018-06-20 DIAGNOSIS — D649 Anemia, unspecified: Secondary | ICD-10-CM | POA: Diagnosis not present

## 2018-06-20 DIAGNOSIS — I2510 Atherosclerotic heart disease of native coronary artery without angina pectoris: Secondary | ICD-10-CM | POA: Diagnosis not present

## 2018-06-20 DIAGNOSIS — Z7901 Long term (current) use of anticoagulants: Secondary | ICD-10-CM | POA: Diagnosis not present

## 2018-06-20 DIAGNOSIS — R296 Repeated falls: Secondary | ICD-10-CM | POA: Diagnosis not present

## 2018-06-20 DIAGNOSIS — I6523 Occlusion and stenosis of bilateral carotid arteries: Secondary | ICD-10-CM | POA: Diagnosis not present

## 2018-06-20 DIAGNOSIS — Z9981 Dependence on supplemental oxygen: Secondary | ICD-10-CM | POA: Diagnosis not present

## 2018-06-20 DIAGNOSIS — J9621 Acute and chronic respiratory failure with hypoxia: Secondary | ICD-10-CM | POA: Diagnosis not present

## 2018-06-20 DIAGNOSIS — H5461 Unqualified visual loss, right eye, normal vision left eye: Secondary | ICD-10-CM | POA: Diagnosis not present

## 2018-06-20 DIAGNOSIS — Z8673 Personal history of transient ischemic attack (TIA), and cerebral infarction without residual deficits: Secondary | ICD-10-CM | POA: Diagnosis not present

## 2018-06-22 DIAGNOSIS — J9621 Acute and chronic respiratory failure with hypoxia: Secondary | ICD-10-CM | POA: Diagnosis not present

## 2018-06-22 DIAGNOSIS — S0231XS Fracture of orbital floor, right side, sequela: Secondary | ICD-10-CM | POA: Diagnosis not present

## 2018-06-22 DIAGNOSIS — I447 Left bundle-branch block, unspecified: Secondary | ICD-10-CM | POA: Diagnosis not present

## 2018-06-22 DIAGNOSIS — I272 Pulmonary hypertension, unspecified: Secondary | ICD-10-CM | POA: Diagnosis not present

## 2018-06-22 DIAGNOSIS — Z9981 Dependence on supplemental oxygen: Secondary | ICD-10-CM | POA: Diagnosis not present

## 2018-06-22 DIAGNOSIS — Z8673 Personal history of transient ischemic attack (TIA), and cerebral infarction without residual deficits: Secondary | ICD-10-CM | POA: Diagnosis not present

## 2018-06-22 DIAGNOSIS — I2510 Atherosclerotic heart disease of native coronary artery without angina pectoris: Secondary | ICD-10-CM | POA: Diagnosis not present

## 2018-06-22 DIAGNOSIS — Z87891 Personal history of nicotine dependence: Secondary | ICD-10-CM | POA: Diagnosis not present

## 2018-06-22 DIAGNOSIS — J449 Chronic obstructive pulmonary disease, unspecified: Secondary | ICD-10-CM | POA: Diagnosis not present

## 2018-06-22 DIAGNOSIS — Z7901 Long term (current) use of anticoagulants: Secondary | ICD-10-CM | POA: Diagnosis not present

## 2018-06-22 DIAGNOSIS — I482 Chronic atrial fibrillation, unspecified: Secondary | ICD-10-CM | POA: Diagnosis not present

## 2018-06-22 DIAGNOSIS — I5033 Acute on chronic diastolic (congestive) heart failure: Secondary | ICD-10-CM | POA: Diagnosis not present

## 2018-06-22 DIAGNOSIS — I110 Hypertensive heart disease with heart failure: Secondary | ICD-10-CM | POA: Diagnosis not present

## 2018-06-22 DIAGNOSIS — E876 Hypokalemia: Secondary | ICD-10-CM | POA: Diagnosis not present

## 2018-06-22 DIAGNOSIS — D649 Anemia, unspecified: Secondary | ICD-10-CM | POA: Diagnosis not present

## 2018-06-22 DIAGNOSIS — H5461 Unqualified visual loss, right eye, normal vision left eye: Secondary | ICD-10-CM | POA: Diagnosis not present

## 2018-06-22 DIAGNOSIS — R296 Repeated falls: Secondary | ICD-10-CM | POA: Diagnosis not present

## 2018-06-22 DIAGNOSIS — Z9181 History of falling: Secondary | ICD-10-CM | POA: Diagnosis not present

## 2018-06-22 DIAGNOSIS — I6523 Occlusion and stenosis of bilateral carotid arteries: Secondary | ICD-10-CM | POA: Diagnosis not present

## 2018-06-22 DIAGNOSIS — I471 Supraventricular tachycardia: Secondary | ICD-10-CM | POA: Diagnosis not present

## 2018-06-22 DIAGNOSIS — E871 Hypo-osmolality and hyponatremia: Secondary | ICD-10-CM | POA: Diagnosis not present

## 2018-06-22 DIAGNOSIS — H52511 Internal ophthalmoplegia (complete) (total), right eye: Secondary | ICD-10-CM | POA: Diagnosis not present

## 2018-06-23 ENCOUNTER — Ambulatory Visit: Payer: Medicare Other | Admitting: Cardiology

## 2018-06-26 DIAGNOSIS — E871 Hypo-osmolality and hyponatremia: Secondary | ICD-10-CM | POA: Diagnosis not present

## 2018-06-26 DIAGNOSIS — J9621 Acute and chronic respiratory failure with hypoxia: Secondary | ICD-10-CM | POA: Diagnosis not present

## 2018-06-26 DIAGNOSIS — I5033 Acute on chronic diastolic (congestive) heart failure: Secondary | ICD-10-CM | POA: Diagnosis not present

## 2018-06-26 DIAGNOSIS — I110 Hypertensive heart disease with heart failure: Secondary | ICD-10-CM | POA: Diagnosis not present

## 2018-06-26 DIAGNOSIS — I6523 Occlusion and stenosis of bilateral carotid arteries: Secondary | ICD-10-CM | POA: Diagnosis not present

## 2018-06-26 DIAGNOSIS — I482 Chronic atrial fibrillation, unspecified: Secondary | ICD-10-CM | POA: Diagnosis not present

## 2018-06-26 DIAGNOSIS — I2510 Atherosclerotic heart disease of native coronary artery without angina pectoris: Secondary | ICD-10-CM | POA: Diagnosis not present

## 2018-06-26 DIAGNOSIS — Z87891 Personal history of nicotine dependence: Secondary | ICD-10-CM | POA: Diagnosis not present

## 2018-06-26 DIAGNOSIS — J449 Chronic obstructive pulmonary disease, unspecified: Secondary | ICD-10-CM | POA: Diagnosis not present

## 2018-06-26 DIAGNOSIS — E876 Hypokalemia: Secondary | ICD-10-CM | POA: Diagnosis not present

## 2018-06-26 DIAGNOSIS — H5461 Unqualified visual loss, right eye, normal vision left eye: Secondary | ICD-10-CM | POA: Diagnosis not present

## 2018-06-26 DIAGNOSIS — Z9181 History of falling: Secondary | ICD-10-CM | POA: Diagnosis not present

## 2018-06-26 DIAGNOSIS — I272 Pulmonary hypertension, unspecified: Secondary | ICD-10-CM | POA: Diagnosis not present

## 2018-06-26 DIAGNOSIS — D649 Anemia, unspecified: Secondary | ICD-10-CM | POA: Diagnosis not present

## 2018-06-26 DIAGNOSIS — Z8673 Personal history of transient ischemic attack (TIA), and cerebral infarction without residual deficits: Secondary | ICD-10-CM | POA: Diagnosis not present

## 2018-06-26 DIAGNOSIS — S0231XS Fracture of orbital floor, right side, sequela: Secondary | ICD-10-CM | POA: Diagnosis not present

## 2018-06-26 DIAGNOSIS — R296 Repeated falls: Secondary | ICD-10-CM | POA: Diagnosis not present

## 2018-06-26 DIAGNOSIS — I471 Supraventricular tachycardia: Secondary | ICD-10-CM | POA: Diagnosis not present

## 2018-06-26 DIAGNOSIS — I447 Left bundle-branch block, unspecified: Secondary | ICD-10-CM | POA: Diagnosis not present

## 2018-06-26 DIAGNOSIS — H52511 Internal ophthalmoplegia (complete) (total), right eye: Secondary | ICD-10-CM | POA: Diagnosis not present

## 2018-06-26 DIAGNOSIS — Z7901 Long term (current) use of anticoagulants: Secondary | ICD-10-CM | POA: Diagnosis not present

## 2018-06-26 DIAGNOSIS — Z9981 Dependence on supplemental oxygen: Secondary | ICD-10-CM | POA: Diagnosis not present

## 2018-06-28 DIAGNOSIS — J9621 Acute and chronic respiratory failure with hypoxia: Secondary | ICD-10-CM | POA: Diagnosis not present

## 2018-06-28 DIAGNOSIS — I5031 Acute diastolic (congestive) heart failure: Secondary | ICD-10-CM | POA: Diagnosis not present

## 2018-06-28 DIAGNOSIS — J449 Chronic obstructive pulmonary disease, unspecified: Secondary | ICD-10-CM | POA: Diagnosis not present

## 2018-07-03 DIAGNOSIS — J84112 Idiopathic pulmonary fibrosis: Secondary | ICD-10-CM | POA: Diagnosis not present

## 2018-07-03 DIAGNOSIS — J9611 Chronic respiratory failure with hypoxia: Secondary | ICD-10-CM | POA: Diagnosis not present

## 2018-07-03 DIAGNOSIS — J301 Allergic rhinitis due to pollen: Secondary | ICD-10-CM | POA: Diagnosis not present

## 2018-07-13 DIAGNOSIS — J449 Chronic obstructive pulmonary disease, unspecified: Secondary | ICD-10-CM | POA: Diagnosis not present

## 2018-07-13 DIAGNOSIS — J9621 Acute and chronic respiratory failure with hypoxia: Secondary | ICD-10-CM | POA: Diagnosis not present

## 2018-07-22 ENCOUNTER — Other Ambulatory Visit: Payer: Self-pay | Admitting: Cardiology

## 2018-07-23 ENCOUNTER — Emergency Department (HOSPITAL_COMMUNITY): Payer: Medicare Other

## 2018-07-23 ENCOUNTER — Encounter (HOSPITAL_COMMUNITY): Payer: Self-pay

## 2018-07-23 ENCOUNTER — Other Ambulatory Visit: Payer: Self-pay

## 2018-07-23 ENCOUNTER — Inpatient Hospital Stay (HOSPITAL_COMMUNITY)
Admission: EM | Admit: 2018-07-23 | Discharge: 2018-08-04 | DRG: 963 | Disposition: A | Discharge status: Skilled Nursing Facility | Payer: Medicare Other | Attending: Nephrology | Admitting: Nephrology

## 2018-07-23 ENCOUNTER — Inpatient Hospital Stay (HOSPITAL_COMMUNITY): Payer: Medicare Other

## 2018-07-23 DIAGNOSIS — E859 Amyloidosis, unspecified: Secondary | ICD-10-CM | POA: Diagnosis present

## 2018-07-23 DIAGNOSIS — R0600 Dyspnea, unspecified: Secondary | ICD-10-CM | POA: Diagnosis not present

## 2018-07-23 DIAGNOSIS — J8489 Other specified interstitial pulmonary diseases: Secondary | ICD-10-CM | POA: Diagnosis present

## 2018-07-23 DIAGNOSIS — I4819 Other persistent atrial fibrillation: Secondary | ICD-10-CM | POA: Diagnosis present

## 2018-07-23 DIAGNOSIS — K5900 Constipation, unspecified: Secondary | ICD-10-CM | POA: Diagnosis present

## 2018-07-23 DIAGNOSIS — I2510 Atherosclerotic heart disease of native coronary artery without angina pectoris: Secondary | ICD-10-CM | POA: Diagnosis present

## 2018-07-23 DIAGNOSIS — N179 Acute kidney failure, unspecified: Secondary | ICD-10-CM | POA: Diagnosis not present

## 2018-07-23 DIAGNOSIS — E039 Hypothyroidism, unspecified: Secondary | ICD-10-CM | POA: Diagnosis not present

## 2018-07-23 DIAGNOSIS — F32A Depression, unspecified: Secondary | ICD-10-CM | POA: Diagnosis present

## 2018-07-23 DIAGNOSIS — J9600 Acute respiratory failure, unspecified whether with hypoxia or hypercapnia: Secondary | ICD-10-CM | POA: Diagnosis not present

## 2018-07-23 DIAGNOSIS — S32591A Other specified fracture of right pubis, initial encounter for closed fracture: Secondary | ICD-10-CM | POA: Diagnosis not present

## 2018-07-23 DIAGNOSIS — M25551 Pain in right hip: Secondary | ICD-10-CM | POA: Diagnosis not present

## 2018-07-23 DIAGNOSIS — R52 Pain, unspecified: Secondary | ICD-10-CM | POA: Diagnosis not present

## 2018-07-23 DIAGNOSIS — K219 Gastro-esophageal reflux disease without esophagitis: Secondary | ICD-10-CM | POA: Diagnosis not present

## 2018-07-23 DIAGNOSIS — Y92009 Unspecified place in unspecified non-institutional (private) residence as the place of occurrence of the external cause: Secondary | ICD-10-CM | POA: Diagnosis not present

## 2018-07-23 DIAGNOSIS — S32401A Unspecified fracture of right acetabulum, initial encounter for closed fracture: Secondary | ICD-10-CM | POA: Diagnosis not present

## 2018-07-23 DIAGNOSIS — W1830XA Fall on same level, unspecified, initial encounter: Secondary | ICD-10-CM | POA: Diagnosis present

## 2018-07-23 DIAGNOSIS — J9621 Acute and chronic respiratory failure with hypoxia: Secondary | ICD-10-CM | POA: Diagnosis present

## 2018-07-23 DIAGNOSIS — J849 Interstitial pulmonary disease, unspecified: Secondary | ICD-10-CM | POA: Diagnosis not present

## 2018-07-23 DIAGNOSIS — R54 Age-related physical debility: Secondary | ICD-10-CM | POA: Diagnosis present

## 2018-07-23 DIAGNOSIS — R0609 Other forms of dyspnea: Secondary | ICD-10-CM | POA: Diagnosis not present

## 2018-07-23 DIAGNOSIS — Z66 Do not resuscitate: Secondary | ICD-10-CM | POA: Diagnosis present

## 2018-07-23 DIAGNOSIS — S79911A Unspecified injury of right hip, initial encounter: Secondary | ICD-10-CM | POA: Diagnosis not present

## 2018-07-23 DIAGNOSIS — I130 Hypertensive heart and chronic kidney disease with heart failure and stage 1 through stage 4 chronic kidney disease, or unspecified chronic kidney disease: Secondary | ICD-10-CM | POA: Diagnosis not present

## 2018-07-23 DIAGNOSIS — D68318 Other hemorrhagic disorder due to intrinsic circulating anticoagulants, antibodies, or inhibitors: Secondary | ICD-10-CM | POA: Diagnosis not present

## 2018-07-23 DIAGNOSIS — S72001A Fracture of unspecified part of neck of right femur, initial encounter for closed fracture: Secondary | ICD-10-CM | POA: Diagnosis present

## 2018-07-23 DIAGNOSIS — J9610 Chronic respiratory failure, unspecified whether with hypoxia or hypercapnia: Secondary | ICD-10-CM | POA: Diagnosis not present

## 2018-07-23 DIAGNOSIS — J449 Chronic obstructive pulmonary disease, unspecified: Secondary | ICD-10-CM | POA: Diagnosis not present

## 2018-07-23 DIAGNOSIS — R0602 Shortness of breath: Secondary | ICD-10-CM | POA: Diagnosis not present

## 2018-07-23 DIAGNOSIS — S32409A Unspecified fracture of unspecified acetabulum, initial encounter for closed fracture: Secondary | ICD-10-CM | POA: Diagnosis present

## 2018-07-23 DIAGNOSIS — I480 Paroxysmal atrial fibrillation: Secondary | ICD-10-CM | POA: Diagnosis present

## 2018-07-23 DIAGNOSIS — T380X5A Adverse effect of glucocorticoids and synthetic analogues, initial encounter: Secondary | ICD-10-CM | POA: Diagnosis not present

## 2018-07-23 DIAGNOSIS — Z7952 Long term (current) use of systemic steroids: Secondary | ICD-10-CM

## 2018-07-23 DIAGNOSIS — T462X1D Poisoning by other antidysrhythmic drugs, accidental (unintentional), subsequent encounter: Secondary | ICD-10-CM | POA: Diagnosis not present

## 2018-07-23 DIAGNOSIS — Z9981 Dependence on supplemental oxygen: Secondary | ICD-10-CM

## 2018-07-23 DIAGNOSIS — D72829 Elevated white blood cell count, unspecified: Secondary | ICD-10-CM | POA: Diagnosis present

## 2018-07-23 DIAGNOSIS — Z8679 Personal history of other diseases of the circulatory system: Secondary | ICD-10-CM | POA: Diagnosis not present

## 2018-07-23 DIAGNOSIS — I5033 Acute on chronic diastolic (congestive) heart failure: Secondary | ICD-10-CM | POA: Diagnosis not present

## 2018-07-23 DIAGNOSIS — R062 Wheezing: Secondary | ICD-10-CM | POA: Diagnosis not present

## 2018-07-23 DIAGNOSIS — R0902 Hypoxemia: Secondary | ICD-10-CM | POA: Diagnosis not present

## 2018-07-23 DIAGNOSIS — M4854XA Collapsed vertebra, not elsewhere classified, thoracic region, initial encounter for fracture: Secondary | ICD-10-CM | POA: Diagnosis present

## 2018-07-23 DIAGNOSIS — Z7901 Long term (current) use of anticoagulants: Secondary | ICD-10-CM

## 2018-07-23 DIAGNOSIS — Z87891 Personal history of nicotine dependence: Secondary | ICD-10-CM

## 2018-07-23 DIAGNOSIS — I214 Non-ST elevation (NSTEMI) myocardial infarction: Secondary | ICD-10-CM | POA: Diagnosis not present

## 2018-07-23 DIAGNOSIS — T462X1A Poisoning by other antidysrhythmic drugs, accidental (unintentional), initial encounter: Secondary | ICD-10-CM | POA: Diagnosis present

## 2018-07-23 DIAGNOSIS — I272 Pulmonary hypertension, unspecified: Secondary | ICD-10-CM | POA: Diagnosis not present

## 2018-07-23 DIAGNOSIS — I509 Heart failure, unspecified: Secondary | ICD-10-CM | POA: Diagnosis not present

## 2018-07-23 DIAGNOSIS — F329 Major depressive disorder, single episode, unspecified: Secondary | ICD-10-CM | POA: Diagnosis present

## 2018-07-23 DIAGNOSIS — E119 Type 2 diabetes mellitus without complications: Secondary | ICD-10-CM

## 2018-07-23 DIAGNOSIS — I429 Cardiomyopathy, unspecified: Secondary | ICD-10-CM | POA: Diagnosis not present

## 2018-07-23 DIAGNOSIS — R0689 Other abnormalities of breathing: Secondary | ICD-10-CM | POA: Diagnosis not present

## 2018-07-23 DIAGNOSIS — I708 Atherosclerosis of other arteries: Secondary | ICD-10-CM | POA: Diagnosis present

## 2018-07-23 DIAGNOSIS — Z7401 Bed confinement status: Secondary | ICD-10-CM | POA: Diagnosis not present

## 2018-07-23 DIAGNOSIS — J9611 Chronic respiratory failure with hypoxia: Secondary | ICD-10-CM | POA: Diagnosis not present

## 2018-07-23 DIAGNOSIS — T501X5A Adverse effect of loop [high-ceiling] diuretics, initial encounter: Secondary | ICD-10-CM | POA: Diagnosis present

## 2018-07-23 DIAGNOSIS — J069 Acute upper respiratory infection, unspecified: Secondary | ICD-10-CM | POA: Diagnosis not present

## 2018-07-23 DIAGNOSIS — Z79890 Hormone replacement therapy (postmenopausal): Secondary | ICD-10-CM

## 2018-07-23 DIAGNOSIS — S299XXA Unspecified injury of thorax, initial encounter: Secondary | ICD-10-CM | POA: Diagnosis not present

## 2018-07-23 DIAGNOSIS — E785 Hyperlipidemia, unspecified: Secondary | ICD-10-CM | POA: Diagnosis not present

## 2018-07-23 DIAGNOSIS — Z888 Allergy status to other drugs, medicaments and biological substances status: Secondary | ICD-10-CM

## 2018-07-23 DIAGNOSIS — M2550 Pain in unspecified joint: Secondary | ICD-10-CM | POA: Diagnosis not present

## 2018-07-23 DIAGNOSIS — J45909 Unspecified asthma, uncomplicated: Secondary | ICD-10-CM | POA: Diagnosis not present

## 2018-07-23 DIAGNOSIS — Z79899 Other long term (current) drug therapy: Secondary | ICD-10-CM

## 2018-07-23 DIAGNOSIS — S32501A Unspecified fracture of right pubis, initial encounter for closed fracture: Secondary | ICD-10-CM | POA: Diagnosis not present

## 2018-07-23 DIAGNOSIS — E1151 Type 2 diabetes mellitus with diabetic peripheral angiopathy without gangrene: Secondary | ICD-10-CM | POA: Diagnosis present

## 2018-07-23 DIAGNOSIS — N182 Chronic kidney disease, stage 2 (mild): Secondary | ICD-10-CM | POA: Diagnosis present

## 2018-07-23 DIAGNOSIS — I10 Essential (primary) hypertension: Secondary | ICD-10-CM | POA: Diagnosis present

## 2018-07-23 DIAGNOSIS — E1165 Type 2 diabetes mellitus with hyperglycemia: Secondary | ICD-10-CM | POA: Diagnosis not present

## 2018-07-23 DIAGNOSIS — I2584 Coronary atherosclerosis due to calcified coronary lesion: Secondary | ICD-10-CM | POA: Diagnosis not present

## 2018-07-23 DIAGNOSIS — J8410 Pulmonary fibrosis, unspecified: Secondary | ICD-10-CM | POA: Diagnosis not present

## 2018-07-23 DIAGNOSIS — I447 Left bundle-branch block, unspecified: Secondary | ICD-10-CM | POA: Diagnosis present

## 2018-07-23 DIAGNOSIS — D649 Anemia, unspecified: Secondary | ICD-10-CM | POA: Diagnosis present

## 2018-07-23 DIAGNOSIS — J188 Other pneumonia, unspecified organism: Secondary | ICD-10-CM | POA: Diagnosis not present

## 2018-07-23 DIAGNOSIS — E1122 Type 2 diabetes mellitus with diabetic chronic kidney disease: Secondary | ICD-10-CM | POA: Diagnosis present

## 2018-07-23 DIAGNOSIS — Z77120 Contact with and (suspected) exposure to mold (toxic): Secondary | ICD-10-CM

## 2018-07-23 DIAGNOSIS — S3289XD Fracture of other parts of pelvis, subsequent encounter for fracture with routine healing: Secondary | ICD-10-CM | POA: Diagnosis not present

## 2018-07-23 DIAGNOSIS — S329XXA Fracture of unspecified parts of lumbosacral spine and pelvis, initial encounter for closed fracture: Secondary | ICD-10-CM | POA: Diagnosis present

## 2018-07-23 DIAGNOSIS — W19XXXA Unspecified fall, initial encounter: Secondary | ICD-10-CM | POA: Diagnosis not present

## 2018-07-23 DIAGNOSIS — E871 Hypo-osmolality and hyponatremia: Secondary | ICD-10-CM | POA: Diagnosis not present

## 2018-07-23 DIAGNOSIS — J9601 Acute respiratory failure with hypoxia: Secondary | ICD-10-CM | POA: Diagnosis not present

## 2018-07-23 DIAGNOSIS — Z880 Allergy status to penicillin: Secondary | ICD-10-CM

## 2018-07-23 DIAGNOSIS — W19XXXD Unspecified fall, subsequent encounter: Secondary | ICD-10-CM | POA: Diagnosis not present

## 2018-07-23 DIAGNOSIS — Z85828 Personal history of other malignant neoplasm of skin: Secondary | ICD-10-CM

## 2018-07-23 LAB — I-STAT TROPONIN, ED: Troponin i, poc: 0.03 ng/mL (ref 0.00–0.08)

## 2018-07-23 LAB — COMPREHENSIVE METABOLIC PANEL
ALK PHOS: 58 U/L (ref 38–126)
ALT: 31 U/L (ref 0–44)
ANION GAP: 14 (ref 5–15)
AST: 33 U/L (ref 15–41)
Albumin: 2.9 g/dL — ABNORMAL LOW (ref 3.5–5.0)
BUN: 19 mg/dL (ref 8–23)
CALCIUM: 8.9 mg/dL (ref 8.9–10.3)
CHLORIDE: 99 mmol/L (ref 98–111)
CO2: 25 mmol/L (ref 22–32)
Creatinine, Ser: 0.94 mg/dL (ref 0.44–1.00)
GFR calc non Af Amer: 56 mL/min — ABNORMAL LOW (ref >60)
Glucose, Bld: 153 mg/dL — ABNORMAL HIGH (ref 70–99)
POTASSIUM: 3.6 mmol/L (ref 3.5–5.1)
SODIUM: 138 mmol/L (ref 135–145)
Total Bilirubin: 0.8 mg/dL (ref 0.3–1.2)
Total Protein: 6.3 g/dL — ABNORMAL LOW (ref 6.5–8.1)

## 2018-07-23 LAB — CBC WITH DIFFERENTIAL/PLATELET
Abs Immature Granulocytes: 0.23 10*3/uL — ABNORMAL HIGH (ref 0.00–0.07)
BASOS ABS: 0 10*3/uL (ref 0.0–0.1)
Basophils Relative: 0 %
EOS ABS: 0.1 10*3/uL (ref 0.0–0.5)
Eosinophils Relative: 1 %
HCT: 33.5 % — ABNORMAL LOW (ref 36.0–46.0)
HEMOGLOBIN: 9.9 g/dL — AB (ref 12.0–15.0)
Immature Granulocytes: 2 %
LYMPHS ABS: 2.1 10*3/uL (ref 0.7–4.0)
Lymphocytes Relative: 16 %
MCH: 23 pg — ABNORMAL LOW (ref 26.0–34.0)
MCHC: 29.6 g/dL — AB (ref 30.0–36.0)
MCV: 77.7 fL — ABNORMAL LOW (ref 80.0–100.0)
Monocytes Absolute: 0.8 10*3/uL (ref 0.1–1.0)
Monocytes Relative: 6 %
NEUTROS ABS: 9.8 10*3/uL — AB (ref 1.7–7.7)
NEUTROS PCT: 75 %
NRBC: 0.2 % (ref 0.0–0.2)
Platelets: 284 10*3/uL (ref 150–400)
RBC: 4.31 MIL/uL (ref 3.87–5.11)
RDW: 23.8 % — AB (ref 11.5–15.5)
WBC: 13 10*3/uL — ABNORMAL HIGH (ref 4.0–10.5)

## 2018-07-23 LAB — I-STAT ARTERIAL BLOOD GAS, ED
Acid-Base Excess: 4 mmol/L — ABNORMAL HIGH (ref 0.0–2.0)
Bicarbonate: 27.8 mmol/L (ref 20.0–28.0)
O2 SAT: 89 %
PH ART: 7.501 — AB (ref 7.350–7.450)
TCO2: 29 mmol/L (ref 22–32)
pCO2 arterial: 35.5 mmHg (ref 32.0–48.0)
pO2, Arterial: 51 mmHg — ABNORMAL LOW (ref 83.0–108.0)

## 2018-07-23 LAB — URINALYSIS, ROUTINE W REFLEX MICROSCOPIC
Bilirubin Urine: NEGATIVE
Glucose, UA: NEGATIVE mg/dL
Hgb urine dipstick: NEGATIVE
Ketones, ur: NEGATIVE mg/dL
Leukocytes, UA: POSITIVE — AB
Nitrite: NEGATIVE
Protein, ur: NEGATIVE mg/dL
Specific Gravity, Urine: 1.02 (ref 1.005–1.030)
pH: 7 (ref 5.0–8.0)

## 2018-07-23 LAB — PROTIME-INR
INR: 1.12
Prothrombin Time: 14.3 seconds (ref 11.4–15.2)

## 2018-07-23 LAB — URINALYSIS, MICROSCOPIC (REFLEX): RBC / HPF: NONE SEEN RBC/hpf (ref 0–5)

## 2018-07-23 MED ORDER — POLYVINYL ALCOHOL 1.4 % OP SOLN
1.0000 [drp] | OPHTHALMIC | Status: DC | PRN
  Administered 2018-07-28 – 2018-07-31 (×4): 1 [drp] via OPHTHALMIC
  Filled 2018-07-23 (×2): qty 15

## 2018-07-23 MED ORDER — FUROSEMIDE 40 MG PO TABS
40.0000 mg | ORAL_TABLET | Freq: Every day | ORAL | Status: DC
  Administered 2018-07-24 – 2018-07-25 (×2): 40 mg via ORAL
  Filled 2018-07-23 (×2): qty 1

## 2018-07-23 MED ORDER — POTASSIUM CHLORIDE CRYS ER 20 MEQ PO TBCR
20.0000 meq | EXTENDED_RELEASE_TABLET | Freq: Every day | ORAL | Status: DC
  Administered 2018-07-23 – 2018-07-27 (×5): 20 meq via ORAL
  Filled 2018-07-23 (×5): qty 1

## 2018-07-23 MED ORDER — AMIODARONE HCL 200 MG PO TABS
200.0000 mg | ORAL_TABLET | Freq: Every day | ORAL | Status: DC
  Administered 2018-07-24 – 2018-07-26 (×3): 200 mg via ORAL
  Filled 2018-07-23 (×2): qty 2
  Filled 2018-07-23: qty 1
  Filled 2018-07-23: qty 2

## 2018-07-23 MED ORDER — MORPHINE SULFATE (PF) 2 MG/ML IV SOLN
1.0000 mg | INTRAVENOUS | Status: DC | PRN
  Administered 2018-07-23 – 2018-07-27 (×13): 1 mg via INTRAVENOUS
  Filled 2018-07-23 (×12): qty 1

## 2018-07-23 MED ORDER — PREDNISONE 20 MG PO TABS
20.0000 mg | ORAL_TABLET | Freq: Every day | ORAL | Status: DC
  Administered 2018-07-24 – 2018-07-26 (×3): 20 mg via ORAL
  Filled 2018-07-23 (×3): qty 1

## 2018-07-23 MED ORDER — APIXABAN 2.5 MG PO TABS
2.5000 mg | ORAL_TABLET | Freq: Two times a day (BID) | ORAL | Status: DC
  Administered 2018-07-23 – 2018-07-26 (×6): 2.5 mg via ORAL
  Filled 2018-07-23 (×6): qty 1

## 2018-07-23 MED ORDER — ACETAMINOPHEN 650 MG RE SUPP: 650.0000 mg | Freq: Four times a day (QID) | RECTAL | Status: DC | PRN

## 2018-07-23 MED ORDER — ACETAMINOPHEN 325 MG PO TABS
650.0000 mg | ORAL_TABLET | Freq: Four times a day (QID) | ORAL | Status: DC | PRN
  Administered 2018-07-24 – 2018-07-28 (×4): 650 mg via ORAL
  Filled 2018-07-23 (×4): qty 2

## 2018-07-23 MED ORDER — POLYSACCHARIDE IRON COMPLEX 150 MG PO CAPS
150.0000 mg | ORAL_CAPSULE | Freq: Every day | ORAL | Status: DC
  Administered 2018-07-24 – 2018-08-04 (×12): 150 mg via ORAL
  Filled 2018-07-23 (×13): qty 1

## 2018-07-23 MED ORDER — METOPROLOL TARTRATE 12.5 MG HALF TABLET
12.5000 mg | ORAL_TABLET | Freq: Two times a day (BID) | ORAL | Status: DC
  Administered 2018-07-23 – 2018-08-04 (×23): 12.5 mg via ORAL
  Filled 2018-07-23 (×24): qty 1

## 2018-07-23 MED ORDER — POLYETHYLENE GLYCOL 3350 17 G PO PACK
17.0000 g | PACK | Freq: Every day | ORAL | Status: DC | PRN
  Administered 2018-07-29: 17 g via ORAL
  Filled 2018-07-23 (×2): qty 1

## 2018-07-23 MED ORDER — PANTOPRAZOLE SODIUM 40 MG PO TBEC
40.0000 mg | DELAYED_RELEASE_TABLET | Freq: Every day | ORAL | Status: DC
  Administered 2018-07-23 – 2018-08-04 (×13): 40 mg via ORAL
  Filled 2018-07-23 (×14): qty 1

## 2018-07-23 MED ORDER — MORPHINE SULFATE (PF) 2 MG/ML IV SOLN
2.0000 mg | Freq: Once | INTRAVENOUS | Status: AC
  Administered 2018-07-23: 2 mg via INTRAVENOUS
  Filled 2018-07-23: qty 1

## 2018-07-23 MED ORDER — LEVOTHYROXINE SODIUM 75 MCG PO TABS
75.0000 ug | ORAL_TABLET | Freq: Every day | ORAL | Status: DC
  Administered 2018-07-24 – 2018-08-04 (×8): 75 ug via ORAL
  Filled 2018-07-23 (×9): qty 1

## 2018-07-23 MED ORDER — ESCITALOPRAM OXALATE 10 MG PO TABS
20.0000 mg | ORAL_TABLET | Freq: Every day | ORAL | Status: DC
  Administered 2018-07-23 – 2018-08-03 (×11): 20 mg via ORAL
  Filled 2018-07-23 (×11): qty 2

## 2018-07-23 NOTE — H&P (Signed)
PCP:   [name], MD   Code status: DNR  Chief Complaint:  Fall  HPI: this is a 84y/o with history of COPD and chronic respiratory failure on 3 L nasal cannula at home.  She presents with complaints of falling after breakfast. She stood up from table and went down. It was a witnessed fall by husband. She did not hit her head, there is no report of LOC. The patient does not remember any of the event. She uses a cane, walker and wheelchair but uses the cane the most. She does not commonly fall.   She has not walked, she is in too much discomfort.   When fireman got there her BP and oxygen was low.  Yesterday her oxygen was low into 81%  History provided by patient and family present at bedside.   Review of Systems:  The patient denies anorexia, fever, weight loss,, vision loss, decreased hearing, hoarseness, chest pain, syncope, dyspnea on exertion, peripheral edema, balance deficits, hemoptysis, abdominal pain, melena, hematochezia, severe indigestion/heartburn, hematuria, incontinence, genital sores, muscle weakness, suspicious skin lesions, transient blindness, difficulty walking, depression, unusual weight change, abnormal bleeding, enlarged lymph nodes, angioedema, and breast masses.  Past Medical History: Past Medical History:  Diagnosis Date  . Atrial fibrillation ([place])   . Carotid artery stenosis with cerebral infarction ([place])   . Carotid atherosclerosis, bilateral   . Chronic bronchitis ([place])   . Depression   . Dyslipidemia   . Edema   . History of blood transfusion 04/24/2018   "low HgB" (04/25/2018)  . Hyperplastic colon polyp   . Hypertension   . Hypothyroidism   . Osteoporosis   . Peripheral vascular disease of extremity ([place])   . PSVT (paroxysmal supraventricular tachycardia) ([place])   . Skin cancer    "scraped off my neck and legs" (04/25/2018)  . Subclavian artery stenosis [facility])    Past Surgical History:  Procedure Laterality Date  . ABDOMINAL  HYSTERECTOMY    . APPENDECTOMY    . CARDIOVERSION N/A 10/17/2017   Procedure: CARDIOVERSION;  Surgeon: Sanda Klein, MD;  Location: [place] ENDOSCOPY;  Service: Cardiovascular;  Laterality: N/A;  . CARDIOVERSION N/A 01/02/2018   Procedure: CARDIOVERSION;  Surgeon: Larey Dresser, MD;  Location: [facility] ENDOSCOPY;  Service: Cardiovascular;  Laterality: N/A;  . CATARACT EXTRACTION W/ INTRAOCULAR LENS  IMPLANT, BILATERAL Bilateral   . COLONOSCOPY W/ BIOPSIES AND POLYPECTOMY    . FRACTURE SURGERY    . HIP FRACTURE SURGERY Left 09/2006  . ORIF ORBITAL FRACTURE  01/07/2018   ORBITOTOMY ANTERIOR WITH DRAINAGE OF ORBITAL HEMATOMA, ORIF ORBITAL FRACTURE,ORBITAL DECOMPRESSION/care everywhere notes    Medications: Prior to Admission medications   Medication Sig Start Date End Date Taking? Authorizing Provider  amiodarone (PACERONE) 200 MG tablet Take 1 tablet (200 mg total) by mouth daily. 11/30/17  Yes Camnitz, Ocie Doyne, MD  apixaban (ELIQUIS) 2.5 MG TABS tablet Take 1 tablet (2.5 mg total) by mouth 2 (two) times daily. 04/28/18  Yes Kathyrn Drown D, NP  escitalopram (LEXAPRO) 20 MG tablet Take 20 mg by mouth at bedtime.   Yes [provider]  furosemide (LASIX) 40 MG tablet Take 40 mg by mouth daily.  08/23/15  Yes [provider]  iron polysaccharides (NIFEREX) 150 MG capsule Take 1 capsule (150 mg total) by mouth daily. 04/29/18  Yes Kathyrn Drown D, NP  levothyroxine (SYNTHROID, LEVOTHROID) 75 MCG tablet Take 75 mcg by mouth at bedtime.   Yes [provider]  metoprolol tartrate (LOPRESSOR)  25 MG tablet Take 12.5 mg by mouth 2 (two) times daily.   Yes [provider]  omeprazole (PRILOSEC) 20 MG capsule Take 20 mg by mouth at bedtime.  09/09/17  Yes [provider]  potassium chloride SA (K-DUR,KLOR-CON) 20 MEQ tablet Take 20 mEq by mouth daily.   Yes [provider]  predniSONE (DELTASONE) 20 MG tablet Take 20 mg by mouth daily with breakfast.   Yes  [provider]  Propylene Glycol (SYSTANE BALANCE) 0.6 % SOLN Place 1 drop into both eyes as needed (dry eyes).   Yes [provider]    Allergies:   Allergies  Allergen Reactions  . Penicillins Anaphylaxis and Other (See Comments)    Has patient had a PCN reaction causing immediate rash, facial/tongue/throat swelling, SOB or lightheadedness with hypotension: Yes Has patient had a PCN reaction causing severe rash involving mucus membranes or skin necrosis: No Has patient had a PCN reaction that required hospitalization: No Has patient had a PCN reaction occurring within the last 10 years: No If all of the above answers are "NO", then may proceed with Cephalosporin use.   . Atorvastatin Other (See Comments)    Muscle pain  . Codeine Other (See Comments)    Nausea/Vomiting  . Colesevelam Other (See Comments)    Unsure of exact reaction type  . Ezetimibe Other (See Comments)    Pain in legs    Social History:  reports that she has quit smoking. Her smoking use included cigarettes. She has a 30.00 pack-year smoking history. She has never used smokeless tobacco. She reports that she does not drink alcohol or use drugs.  Family History: Family History  Problem Relation Age of Onset  . Heart failure Mother   . COPD Father   . Hypertension Brother   . Cancer Brother     Physical Exam: Vitals:   07/23/18 1230 07/23/18 1245 07/23/18 1300 07/23/18 1315  BP: (!) 119/53 (!) 133/56 (!) 149/76 (!) 111/48  Pulse: 72 (!) 25 66 72  Resp: 17 14 19  (!) 26  Temp:      TempSrc:      SpO2: 91% 91% 93% 95%  Weight:      Height:        General:  Alert and oriented times three, well developed and nourished, weak, shaky elderly female Eyes: PERRLA, pink conjunctiva, no scleral icterus ENT: Moist oral mucosa, neck supple, no thyromegaly Lungs: clear to ascultation, no wheeze, no crackles, no use of accessory muscles Cardiovascular: regular rate and rhythm, no  regurgitation, no gallops, no murmurs. No carotid bruits, no JVD Abdomen: soft, positive BS, non-tender, non-distended, no organomegaly, not an acute abdomen GU: not examined Neuro: CN II - XII grossly intact, sensation intact Musculoskeletal: Right hip pain, no clubbing, cyanosis or edema Skin: no rash, no subcutaneous crepitation, no decubitus Psych: appropriate patient   Labs on Admission:  Recent Labs    07/23/18 1212  NA 138  K 3.6  CL 99  CO2 25  GLUCOSE 153*  BUN 19  CREATININE 0.94  CALCIUM 8.9   Recent Labs    07/23/18 1212  AST 33  ALT 31  ALKPHOS 58  BILITOT 0.8  PROT 6.3*  ALBUMIN 2.9*   No results for input(s): LIPASE, AMYLASE in the last 72 hours. Recent Labs    07/23/18 1212  WBC 13.0*  NEUTROABS 9.8*  HGB 9.9*  HCT 33.5*  MCV 77.7*  PLT 284   No results for input(s):  CKTOTAL, CKMB, CKMBINDEX, TROPONINI in the last 72 hours. Invalid input(s): POCBNP No results for input(s): DDIMER in the last 72 hours. No results for input(s): HGBA1C in the last 72 hours. No results for input(s): CHOL, HDL, LDLCALC, TRIG, CHOLHDL, LDLDIRECT in the last 72 hours. No results for input(s): TSH, T4TOTAL, T3FREE, THYROIDAB in the last 72 hours.  Invalid input(s): FREET3 No results for input(s): VITAMINB12, FOLATE, FERRITIN, TIBC, IRON, RETICCTPCT in the last 72 hours.  Micro Results: No results found for this or any previous visit (from the past 240 hour(s)).   Radiological Exams on Admission: Dg Chest 1 View  Result Date: 07/23/2018 CLINICAL DATA:  Fall, pelvic fracture EXAM: CHEST  1 VIEW COMPARISON:  06/16/2018 FINDINGS: Lower lung volumes which accentuates the diffuse reticulonodular interstitial opacities throughout both lungs compared to the prior study. Difficult to exclude superimposed edema. Heart is enlarged. No large effusion or pneumothorax. Aorta atherosclerotic. Previous thoracic vertebral augmentations. Degenerative changes of the spine. Bones are  osteopenic. Monitor leads overlie the chest. IMPRESSION: Lower lung volumes which appear to accentuate the interstitial lung disease. Difficult to exclude superimposed edema Heart remains enlarged. Thoracic atherosclerosis Electronically Signed   By: Jerilynn Mages.  Shick M.D.   On: 07/23/2018 13:35   Dg Hip Unilat W Or Wo Pelvis 2-3 Views Right  Result Date: 07/23/2018 CLINICAL DATA:  Fall, right hip injury, pain EXAM: DG HIP (WITH OR WITHOUT PELVIS) 2-3V RIGHT COMPARISON:  09/23/2013 FINDINGS: Bones are osteopenic. Degenerative changes of the lower lumbar spine, SI joints and left hip. Remote right hip arthroplasty. Cortical irregularity of the right acetabulum. Difficult to exclude nondisplaced right acetabular fracture. Also there is oblique lucency through the right inferior ramus compatible with a right inferior ramus fracture. No diastasis. Remainder of the pelvis intact. No subluxation dislocation. Peripheral atherosclerosis noted. IMPRESSION: Remote right hip arthroplasty Right inferior ramus fracture, suspect acute Cortical irregularity of the right acetabulum medially, difficult to exclude nondisplaced acetabular fracture in the setting of osteopenia. Electronically Signed   By: Jerilynn Mages.  Shick M.D.   On: 07/23/2018 13:32    Assessment/Plan Present on Admission: Right inferior rami fracture/. Intractable pain/fall -Admit to MedSurg -Pain meds as needed -PT consulted -Social worker consult for possible placement for rehab -CT hip ordered  Pulmonary Fibrosis/Chronic respiratory failure -2L oxygen -Stable, home meds resumed  . Dyslipidemia -Stable, home meds resumed  . Hypertension -Stable, home meds resumed  . Depression -Stable, home meds resumed  . Persistent atrial fibrillation/chronic anticoagulation -Stable, home meds resumed  Dannetta Lekas 07/23/2018, 2:25 PM

## 2018-07-23 NOTE — Plan of Care (Signed)

## 2018-07-23 NOTE — ED Provider Notes (Signed)
[facility] EMERGENCY DEPARTMENT Provider Note   CSN: [id number] Arrival date & time: 07/23/18  1200     History   Chief Complaint Chief Complaint  Patient presents with  . Shortness of Breath    HPI Brooke Mullins is a 84 y.o. female.  84 year old female with prior medical history as detailed below presents for evaluation after fall.  Patient reports that she was at home and then stood quickly.  As she stood she reports that she passed out.  She landed hard on her right hip.  She complains of pain to the right hip.  She is unable to ambulate after the fall.  She does not report head injury.  Family who witnessed the fall report that she did not hit her head.  Patient was noted to be hypoxic in route by EMS.  Patient does wear O2 all the time.  She typically wears 2 to 3 L nasal cannula.  She denies significant shortness of breath upon my evaluation.  The history is provided by the patient and medical records.  Fall  This is a new problem. The current episode started 1 to 2 hours ago. The problem occurs constantly. The problem has not changed since onset.Associated symptoms include shortness of breath. Pertinent negatives include no chest pain, no abdominal pain and no headaches. Nothing aggravates the symptoms. Nothing relieves the symptoms.    Past Medical History:  Diagnosis Date  . Atrial fibrillation ([place])   . Carotid artery stenosis with cerebral infarction ([place])   . Carotid atherosclerosis, bilateral   . Chronic bronchitis ([place])   . Depression   . Dyslipidemia   . Edema   . History of blood transfusion 04/24/2018   "low HgB" (04/25/2018)  . Hyperplastic colon polyp   . Hypertension   . Hypothyroidism   . Osteoporosis   . Peripheral vascular disease of extremity ([place])   . PSVT (paroxysmal supraventricular tachycardia) ([place])   . Skin cancer    "scraped off my neck and legs" (04/25/2018)  . Subclavian artery stenosis [facility])     Patient Active  Problem List   Diagnosis Date Noted  . Pressure injury of skin 04/28/2018  . Chronic anticoagulation 04/28/2018  . Hypokalemia 04/28/2018  . Hyponatremia 04/28/2018  . Cough 04/26/2018  . Anemia 04/26/2018  . Diastolic congestive heart failure, NYHA class 3 ([place]) 04/25/2018  . Persistent atrial fibrillation 09/19/2017  . Peripheral vascular disease of extremity ([place])   . Dyslipidemia   . Carotid atherosclerosis, bilateral   . Subclavian artery stenosis ([place])   . PSVT (paroxysmal supraventricular tachycardia) ([place])   . Hypertension   . Hyperplastic colon polyp   . Fracture of femoral neck, right ([place])   . Carotid artery stenosis with cerebral infarction ([place])   . Depression   . Edema     Past Surgical History:  Procedure Laterality Date  . ABDOMINAL HYSTERECTOMY    . APPENDECTOMY    . CARDIOVERSION N/A 10/17/2017   Procedure: CARDIOVERSION;  Surgeon: Sanda Klein, MD;  Location: [facility] ENDOSCOPY;  Service: Cardiovascular;  Laterality: N/A;  . CARDIOVERSION N/A 01/02/2018   Procedure: CARDIOVERSION;  Surgeon: Larey Dresser, MD;  Location: [facility] ENDOSCOPY;  Service: Cardiovascular;  Laterality: N/A;  . CATARACT EXTRACTION W/ INTRAOCULAR LENS  IMPLANT, BILATERAL Bilateral   . COLONOSCOPY W/ BIOPSIES AND POLYPECTOMY    . FRACTURE SURGERY    . HIP FRACTURE SURGERY Left 09/2006  . ORIF ORBITAL FRACTURE  01/07/2018   ORBITOTOMY ANTERIOR WITH  DRAINAGE OF ORBITAL HEMATOMA, ORIF ORBITAL FRACTURE,ORBITAL DECOMPRESSION/care everywhere notes     OB History   No obstetric history on file.      Home Medications    Prior to Admission medications   Medication Sig Start Date End Date Taking? Authorizing Provider  amiodarone (PACERONE) 200 MG tablet Take 1 tablet (200 mg total) by mouth daily. 11/30/17  Yes Camnitz, Ocie Doyne, MD  apixaban (ELIQUIS) 2.5 MG TABS tablet Take 1 tablet (2.5 mg total) by mouth 2 (two) times daily. 04/28/18  Yes Kathyrn Drown D, NP  escitalopram (LEXAPRO) 20 MG  tablet Take 20 mg by mouth at bedtime.   Yes [provider]  furosemide (LASIX) 40 MG tablet Take 40 mg by mouth daily.  08/23/15  Yes [provider]  iron polysaccharides (NIFEREX) 150 MG capsule Take 1 capsule (150 mg total) by mouth daily. 04/29/18  Yes Kathyrn Drown D, NP  levothyroxine (SYNTHROID, LEVOTHROID) 75 MCG tablet Take 75 mcg by mouth at bedtime.   Yes [provider]  metoprolol tartrate (LOPRESSOR) 25 MG tablet Take 12.5 mg by mouth 2 (two) times daily.   Yes [provider]  omeprazole (PRILOSEC) 20 MG capsule Take 20 mg by mouth at bedtime.  09/09/17  Yes [provider]  potassium chloride SA (K-DUR,KLOR-CON) 20 MEQ tablet Take 20 mEq by mouth daily.   Yes [provider]  predniSONE (DELTASONE) 20 MG tablet Take 20 mg by mouth daily with breakfast.   Yes [provider]  Propylene Glycol (SYSTANE BALANCE) 0.6 % SOLN Place 1 drop into both eyes as needed (dry eyes).   Yes [provider]    Family History Family History  Problem Relation Age of Onset  . Heart failure Mother   . COPD Father   . Hypertension Brother   . Cancer Brother     Social History Social History   Tobacco Use  . Smoking status: Former Smoker    Packs/day: 1.00    Years: 30.00    Pack years: 30.00    Types: Cigarettes  . Smokeless tobacco: Never Used  . Tobacco comment: 04/25/2018 "quit smoking in the 1980s"  Substance Use Topics  . Alcohol use: Never    Frequency: Never  . Drug use: Never     Allergies   Penicillins; Atorvastatin; Codeine; Colesevelam; and Ezetimibe   Review of Systems Review of Systems  Respiratory: Positive for shortness of breath.   Cardiovascular: Negative for chest pain.  Gastrointestinal: Negative for abdominal pain.  Neurological: Negative for headaches.  All other systems reviewed and are negative.    Physical Exam Updated Vital Signs BP (!) 111/48   Pulse 72   Temp 98.1 F  (36.7 C) (Oral)   Resp (!) 26   Ht 5\' 4"  (1.626 m)   Wt 56.7 kg   SpO2 95%   BMI 21.46 kg/m   Physical Exam Vitals signs and nursing note reviewed.  Constitutional:      General: She is not in acute distress.    Appearance: She is well-developed.  HENT:     Head: Normocephalic and atraumatic.  Eyes:     Conjunctiva/sclera: Conjunctivae normal.     Pupils: Pupils are equal, round, and reactive to light.  Neck:     Musculoskeletal: Normal range of motion and neck supple.  Cardiovascular:     Rate and Rhythm: Normal rate and regular rhythm.     Heart sounds: Normal heart sounds.  Pulmonary:  Effort: Pulmonary effort is normal. No respiratory distress.     Comments: Decreased breath sounds in all lung fields Abdominal:     General: There is no distension.     Palpations: Abdomen is soft.     Tenderness: There is no abdominal tenderness.  Musculoskeletal: Normal range of motion.        General: No deformity.  Skin:    General: Skin is warm and dry.  Neurological:     Mental Status: She is alert and oriented to person, place, and time.      ED Treatments / Results  Labs (all labs ordered are listed, but only abnormal results are displayed) Labs Reviewed  COMPREHENSIVE METABOLIC PANEL - Abnormal; Notable for the following components:      Result Value   Glucose, Bld 153 (*)    Total Protein 6.3 (*)    Albumin 2.9 (*)    GFR calc non Af Amer 56 (*)    All other components within normal limits  CBC WITH DIFFERENTIAL/PLATELET - Abnormal; Notable for the following components:   WBC 13.0 (*)    Hemoglobin 9.9 (*)    HCT 33.5 (*)    MCV 77.7 (*)    MCH 23.0 (*)    MCHC 29.6 (*)    RDW 23.8 (*)    Neutro Abs 9.8 (*)    Abs Immature Granulocytes 0.23 (*)    All other components within normal limits  I-STAT ARTERIAL BLOOD GAS, ED - Abnormal; Notable for the following components:   pH, Arterial 7.501 (*)    pO2, Arterial 51.0 (*)    Acid-Base Excess 4.0 (*)    All  other components within normal limits  PROTIME-INR  URINALYSIS, ROUTINE W REFLEX MICROSCOPIC  I-STAT TROPONIN, ED    EKG EKG Interpretation  Date/Time:  Sunday July 23 2018 12:11:17 EST Ventricular Rate:  82 PR Interval:    QRS Duration: 142 QT Interval:  450 QTC Calculation: 526 R Axis:   -37 Text Interpretation:  Sinus rhythm Atrial premature complexes in couplets Probable left atrial enlargement Left bundle branch block Confirmed by Dene Gentry [phone]) on 07/23/2018 12:33:31 PM   Radiology Dg Chest 1 View  Result Date: 07/23/2018 CLINICAL DATA:  Fall, pelvic fracture EXAM: CHEST  1 VIEW COMPARISON:  06/16/2018 FINDINGS: Lower lung volumes which accentuates the diffuse reticulonodular interstitial opacities throughout both lungs compared to the prior study. Difficult to exclude superimposed edema. Heart is enlarged. No large effusion or pneumothorax. Aorta atherosclerotic. Previous thoracic vertebral augmentations. Degenerative changes of the spine. Bones are osteopenic. Monitor leads overlie the chest. IMPRESSION: Lower lung volumes which appear to accentuate the interstitial lung disease. Difficult to exclude superimposed edema Heart remains enlarged. Thoracic atherosclerosis Electronically Signed   By: Jerilynn Mages.  Shick M.D.   On: 07/23/2018 13:35   Dg Hip Unilat W Or Wo Pelvis 2-3 Views Right  Result Date: 07/23/2018 CLINICAL DATA:  Fall, right hip injury, pain EXAM: DG HIP (WITH OR WITHOUT PELVIS) 2-3V RIGHT COMPARISON:  09/23/2013 FINDINGS: Bones are osteopenic. Degenerative changes of the lower lumbar spine, SI joints and left hip. Remote right hip arthroplasty. Cortical irregularity of the right acetabulum. Difficult to exclude nondisplaced right acetabular fracture. Also there is oblique lucency through the right inferior ramus compatible with a right inferior ramus fracture. No diastasis. Remainder of the pelvis intact. No subluxation dislocation. Peripheral atherosclerosis  noted. IMPRESSION: Remote right hip arthroplasty Right inferior ramus fracture, suspect acute Cortical irregularity of the right acetabulum medially,  difficult to exclude nondisplaced acetabular fracture in the setting of osteopenia. Electronically Signed   By: Jerilynn Mages.  Shick M.D.   On: 07/23/2018 13:32    Procedures Procedures (including critical care time)  Medications Ordered in ED Medications - No data to display   Initial Impression / Assessment and Plan / ED Course  I have reviewed the triage vital signs and the nursing notes.  Pertinent labs & imaging results that were available during my care of the patient were reviewed by me and considered in my medical decision making (see chart for details).     MDM  Screen complete  Patient is presenting for evaluation following reported fall.  Patient with significant pain to the right hip.  Plain films of the right hip suggest that her pain may be secondary to a right inferior pubic rami fracture.  Patient's respiratory status appears to be close to her baseline.  Screening labs do not suggest other significant pathology or injury.  However, given patient's significant pain I think that she would benefit from an observation admission for pain control and likely physical therapy eval.  Medicine service is aware of case and will evaluate for admission.  Final Clinical Impressions(s) / ED Diagnoses   Final diagnoses:  Dyspnea    ED Discharge Orders    None       Valarie Merino, MD 07/23/18 1425

## 2018-07-23 NOTE — ED Notes (Signed)
Report called to 5N RN. Will transport pt once back from CT.

## 2018-07-23 NOTE — ED Triage Notes (Signed)
Pt arrives to [facility] via [facility] for fall, landing on right hip, no LOC, did not hit head.  Hip tender Sats dropped and pt got pale, increased RR 25-30/min. Sats in 70-80%, put on 50% NRB. EKG 12 lead LBBB. Bilateral wheezing that resolved after 1 albuterol treatment. 96% RA in ER at this time. 20 LAC 108/62 141 cbg (no DM) Hx pulm fibrosis, 2L O2 at home, afib (xarelto)

## 2018-07-23 NOTE — Progress Notes (Signed)
Pt arrived to 5N27 via stretcher accompanied by family. Received report from [place], [place] in ED. See assessment. Will continue to monitor.

## 2018-07-24 LAB — CBC
HCT: 28.2 % — ABNORMAL LOW (ref 36.0–46.0)
Hemoglobin: 9 g/dL — ABNORMAL LOW (ref 12.0–15.0)
MCH: 24 pg — ABNORMAL LOW (ref 26.0–34.0)
MCHC: 31.9 g/dL (ref 30.0–36.0)
MCV: 75.2 fL — ABNORMAL LOW (ref 80.0–100.0)
Platelets: 260 10*3/uL (ref 150–400)
RBC: 3.75 MIL/uL — ABNORMAL LOW (ref 3.87–5.11)
RDW: 23.7 % — ABNORMAL HIGH (ref 11.5–15.5)
WBC: 8.7 10*3/uL (ref 4.0–10.5)
nRBC: 0.2 % (ref 0.0–0.2)

## 2018-07-24 LAB — BASIC METABOLIC PANEL
Anion gap: 15 (ref 5–15)
BUN: 21 mg/dL (ref 8–23)
CO2: 28 mmol/L (ref 22–32)
Calcium: 8.7 mg/dL — ABNORMAL LOW (ref 8.9–10.3)
Chloride: 95 mmol/L — ABNORMAL LOW (ref 98–111)
Creatinine, Ser: 0.95 mg/dL (ref 0.44–1.00)
GFR calc Af Amer: >60 mL/min (ref >60)
GFR, EST NON AFRICAN AMERICAN: 56 mL/min — AB (ref >60)
Glucose, Bld: 124 mg/dL — ABNORMAL HIGH (ref 70–99)
POTASSIUM: 4.3 mmol/L (ref 3.5–5.1)
Sodium: 138 mmol/L (ref 135–145)

## 2018-07-24 NOTE — Progress Notes (Addendum)
PROGRESS NOTE    Brooke Mullins  BLT:[id number] DOB: Nov 29, 1933 DOA: 07/23/2018 PCP: [name], MD   Brief Narrative: 84 year old female with history of pulmonary fibrosis chronic hypoxic respiratory failure on 3 to nasal cannula came to the ER after fall at home.  In the ER found to have right pelvic fracture, was admitted for pain control, nonoperative management.  Assessment & Plan:   Right inferior rami fracture along with intractable pain: Continue uncontrolled oral and IV opiates, PT OT to continue.  She will likely need skilled nursing facility placement.  She lives with her elderly husband.  apprecaite orthopedic input.  COPD/pulmonary fibrosis with chronic hypoxic respiratory failure.  Continue on nasal cannula oxygen to maintain at least 89% or above.  Continue on pulmonary toileting/nebulizer.  Continue prednisone.  Initial mild leukocytosis likely reactive currently resolved.  Hypertension: Controlled.  Continue Lopressor.  Depression : Mood is stable.  Continue her Lexapro.  Persistent atrial fibrillation: Continue her amiodarone, Eliquis, Lopressor.  Hypothyroidism: On Synthroid.  Latest TSH 6.1 in September.  Repeat.  DVT prophylaxis: Requests Code Status: DNR Family Communication: No family at the bedside Disposition Plan: skilled nursing facility   Consultants: orthopedics  Procedures: none  Antimicrobials: none  Subjective: Patient is seen and examined.  Still complaining of significant pain with movement of her leg.  No nausea vomiting chest pain or shortness of breath.  She is on nasal cannula oxygen.  Objective: Vitals:   07/24/18 0311 07/24/18 0332 07/24/18 0901 07/24/18 1412  BP: (!) 145/71  (!) 158/67 128/60  Pulse: 87  67 62  Resp:    18  Temp: 99.7 F (37.6 C) 98.3 F (36.8 C)  98.1 F (36.7 C)  TempSrc: Oral Oral  Oral  SpO2: 92%   (!) 89%  Weight:      Height:        Intake/Output Summary (Last 24 hours) at 07/24/2018  1433 Last data filed at 07/24/2018 0900 Gross per 24 hour  Intake 240 ml  Output 1300 ml  Net -1060 ml   Filed Weights   07/23/18 1219  Weight: 56.7 kg   Weight change:   Body mass index is 21.46 kg/m.  Examination:  General exam: Appears calm and comfortable not in acute distress, on nasal cannula, elderly and frail. HEENT:PERRL,Oral mucosa moist, Ear/Nose normal on gross exam Respiratory system: Bilateral equal air entry, normal vesicular breath sounds, no wheezes or crackles  Cardiovascular system: S1 & S2 heard,No JVD, murmurs.No pedal edema. Gastrointestinal system: Abdomen is  soft, non tender, non distended, BS +  Nervous System:Alert and oriented. No focal neurological deficits/moving extremities. Extremities: No edema, no clubbing,distal peripheral pulses palpable.  Tender right hip.  Very painful hip on pushing her right leg. Skin: No rashes, lesions,no icterus MSK: Normal muscle bulk,tone ,power  Data Reviewed: I have personally reviewed following labs and imaging studies  CBC: Recent Labs  Lab 07/23/18 1212 07/24/18 0228  WBC 13.0* 8.7  NEUTROABS 9.8*  --   HGB 9.9* 9.0*  HCT 33.5* 28.2*  MCV 77.7* 75.2*  PLT 284 007   Basic Metabolic Panel: Recent Labs  Lab 07/23/18 1212 07/24/18 0228  NA 138 138  K 3.6 4.3  CL 99 95*  CO2 25 28  GLUCOSE 153* 124*  BUN 19 21  CREATININE 0.94 0.95  CALCIUM 8.9 8.7*   GFR: Estimated Creatinine Clearance: 39.4 mL/min (by C-G formula based on SCr of 0.95 mg/dL). Liver Function Tests: Recent Labs  Lab 07/23/18  1212  AST 33  ALT 31  ALKPHOS 58  BILITOT 0.8  PROT 6.3*  ALBUMIN 2.9*   No results for input(s): LIPASE, AMYLASE in the last 168 hours. No results for input(s): AMMONIA in the last 168 hours. Coagulation Profile: Recent Labs  Lab 07/23/18 1212  INR 1.12   Cardiac Enzymes: No results for input(s): CKTOTAL, CKMB, CKMBINDEX, TROPONINI in the last 168 hours. BNP (last 3 results) Recent Labs     05/12/18 1020  PROBNP 2,040*   HbA1C: No results for input(s): HGBA1C in the last 72 hours. CBG: No results for input(s): GLUCAP in the last 168 hours. Lipid Profile: No results for input(s): CHOL, HDL, LDLCALC, TRIG, CHOLHDL, LDLDIRECT in the last 72 hours. Thyroid Function Tests: No results for input(s): TSH, T4TOTAL, FREET4, T3FREE, THYROIDAB in the last 72 hours. Anemia Panel: No results for input(s): VITAMINB12, FOLATE, FERRITIN, TIBC, IRON, RETICCTPCT in the last 72 hours. Sepsis Labs: No results for input(s): PROCALCITON, LATICACIDVEN in the last 168 hours.  No results found for this or any previous visit (from the past 240 hour(s)).    Radiology Studies: Dg Chest 1 View  Result Date: 07/23/2018 CLINICAL DATA:  Fall, pelvic fracture EXAM: CHEST  1 VIEW COMPARISON:  06/16/2018 FINDINGS: Lower lung volumes which accentuates the diffuse reticulonodular interstitial opacities throughout both lungs compared to the prior study. Difficult to exclude superimposed edema. Heart is enlarged. No large effusion or pneumothorax. Aorta atherosclerotic. Previous thoracic vertebral augmentations. Degenerative changes of the spine. Bones are osteopenic. Monitor leads overlie the chest. IMPRESSION: Lower lung volumes which appear to accentuate the interstitial lung disease. Difficult to exclude superimposed edema Heart remains enlarged. Thoracic atherosclerosis Electronically Signed   By: Jerilynn Mages.  Shick M.D.   On: 07/23/2018 13:35   Ct Hip Right Wo Contrast  Result Date: 07/23/2018 CLINICAL DATA:  Fall.  Right hip pain. EXAM: CT OF THE RIGHT HIP WITHOUT CONTRAST TECHNIQUE: Multidetector CT imaging of the right hip was performed according to the standard protocol. Multiplanar CT image reconstructions were also generated. COMPARISON:  X-rays from earlier today. FINDINGS: Bones/Joint/Cartilage Status post right hip hemiarthroplasty. There is a comminuted fracture of the right acetabulum involving the medial  wall and roof. Femoral component is located. There is no acute fracture of the right inferior pubic ramus with approximately 8-10 mm of bony over riding. Ligaments Suboptimally assessed by CT. Muscles and Tendons No substantial hematoma within the right hip musculature. There is some hemorrhage in the right pelvic sidewall from the adjacent acetabular fracture. Soft tissues Bladder is markedly distended. IMPRESSION: Comminuted right acetabular fracture involving the medial wall and roof. The femoral component of the hemiarthroplasty remains located in the acetabulum. Associated acute fracture of the right inferior pubic ramus. Hemorrhage in the soft tissues of the right pelvic sidewall. Electronically Signed   By: Misty Stanley M.D.   On: 07/23/2018 16:30   Dg Hip Unilat W Or Wo Pelvis 2-3 Views Right  Result Date: 07/23/2018 CLINICAL DATA:  Fall, right hip injury, pain EXAM: DG HIP (WITH OR WITHOUT PELVIS) 2-3V RIGHT COMPARISON:  09/23/2013 FINDINGS: Bones are osteopenic. Degenerative changes of the lower lumbar spine, SI joints and left hip. Remote right hip arthroplasty. Cortical irregularity of the right acetabulum. Difficult to exclude nondisplaced right acetabular fracture. Also there is oblique lucency through the right inferior ramus compatible with a right inferior ramus fracture. No diastasis. Remainder of the pelvis intact. No subluxation dislocation. Peripheral atherosclerosis noted. IMPRESSION: Remote right hip arthroplasty Right inferior  ramus fracture, suspect acute Cortical irregularity of the right acetabulum medially, difficult to exclude nondisplaced acetabular fracture in the setting of osteopenia. Electronically Signed   By: Jerilynn Mages.  Shick M.D.   On: 07/23/2018 13:32     Scheduled Meds: . amiodarone  200 mg Oral Daily  . apixaban  2.5 mg Oral BID  . escitalopram  20 mg Oral QHS  . furosemide  40 mg Oral Daily  . iron polysaccharides  150 mg Oral Daily  . levothyroxine  75 mcg Oral QAC  breakfast  . metoprolol tartrate  12.5 mg Oral BID  . pantoprazole  40 mg Oral Daily  . potassium chloride SA  20 mEq Oral Daily  . predniSONE  20 mg Oral Q breakfast   Continuous Infusions:   LOS: 1 day   Time spent: More than 50% of that time was spent in counseling and/or coordination of care.  Antonieta Pert, MD [facility] Pager [phone]  If 7PM-7AM, please contact night-coverage [email or web address] Password [facility] 07/24/2018, 2:33 PM

## 2018-07-24 NOTE — Evaluation (Signed)
Physical Therapy Evaluation Patient Details Name: Brooke Mullins MRN: [id number] DOB: 07-Dec-1933 Today's Date: 07/24/2018   History of Present Illness  Pt is 84 yo female who fell at home and sustained R acetabular and pubic ramus fx in setting of past hemiarthroplasty. PMH: GERD, HTN, a fib, DM, PVD, and osteoporosis.  Clinical Impression  Pt admitted with above diagnosis. Pt currently with functional limitations due to the deficits listed below (see PT Problem List). Pt very painful with all RLE passive motion and cannot move RLE actively due to pain. Max A +2 for bed mobility as well as transfer to chair and unable to ambulate keeping RLE TDWB at this point.  Pt will benefit from skilled PT to increase their independence and safety with mobility to allow discharge to the venue listed below.       Follow Up Recommendations SNF;Supervision/Assistance - 24 hour    Equipment Recommendations  None recommended by PT    Recommendations for Other Services       Precautions / Restrictions Precautions Precautions: Fall Restrictions Weight Bearing Restrictions: Yes RLE Weight Bearing: Touchdown weight bearing      Mobility  Bed Mobility Overal bed mobility: Needs Assistance Bed Mobility: Supine to Sit     Supine to sit: Max assist;+2 for physical assistance     General bed mobility comments: Max A +2 to manage RLE and faciltiate hips toward EOB with pad. Max A to elevated trunk  Transfers Overall transfer level: Needs assistance Equipment used: Rolling walker (2 wheeled) Transfers: Sit to/from [facility] Sit to Stand: Mod assist;+2 physical assistance;From elevated surface Stand pivot transfers: Max assist;+2 physical assistance       General transfer comment: Mod A +2 to power up into standing and maintain TDWB status. Max A for balance and RW management during pivot to left.   Ambulation/Gait             General Gait Details: unable  Stairs             Wheelchair Mobility    Modified Rankin (Stroke Patients Only)       Balance Overall balance assessment: Needs assistance Sitting-balance support: No upper extremity supported;Feet supported Sitting balance-Leahy Scale: Fair     Standing balance support: Bilateral upper extremity supported;During functional activity Standing balance-Leahy Scale: Poor Standing balance comment: Reliant on UE support                             Pertinent Vitals/Pain Pain Assessment: Faces Faces Pain Scale: Hurts whole lot Pain Location: RLE Pain Descriptors / Indicators: Discomfort;Grimacing;Guarding;Moaning;Shooting Pain Intervention(s): Limited activity within patient's tolerance;Monitored during session;Repositioned    Home Living Family/patient expects to be discharged to:: Private residence Living Arrangements: Spouse/significant other Available Help at Discharge: Family;Available 24 hours/day Type of Home: House Home Access: Ramped entrance     Home Layout: One level Home Equipment: Walker - 2 wheels;Cane - single point;Bedside commode;Shower seat;Wheelchair - manual Additional Comments: Home O2 2L    Prior Function Level of Independence: Needs assistance   Gait / Transfers Assistance Needed: Reports she did not use DME PTA  ADL's / Homemaking Assistance Needed: Reports she performed ADLs  Comments: Sister performs IADLs and husband drives     Hand Dominance   Dominant Hand: Right    Extremity/Trunk Assessment   Upper Extremity Assessment Upper Extremity Assessment: Defer to OT evaluation    Lower Extremity Assessment Lower Extremity Assessment: Generalized  weakness;RLE deficits/detail RLE Deficits / Details: screams out in pain with all mvmt of RLE, even ankle. Did better throughout session. Unable to move RLE actively due to pain RLE: Unable to fully assess due to pain RLE Sensation: WNL RLE Coordination: WNL    Cervical / Trunk  Assessment Cervical / Trunk Assessment: Kyphotic  Communication   Communication: HOH  Cognition Arousal/Alertness: Awake/alert Behavior During Therapy: WFL for tasks assessed/performed Overall Cognitive Status: Within Functional Limits for tasks assessed                                        General Comments General comments (skin integrity, edema, etc.): SpO2 81-91% throughout on 3-4L O2    Exercises     Assessment/Plan    PT Assessment Patient needs continued PT services  PT Problem List Decreased strength;Decreased activity tolerance;Decreased range of motion;Decreased balance;Decreased mobility;Decreased knowledge of use of DME;Decreased knowledge of precautions;Pain       PT Treatment Interventions DME instruction;Gait training;Functional mobility training;Therapeutic activities;Therapeutic exercise;Balance training;Patient/family education    PT Goals (Current goals can be found in the Care Plan section)  Acute Rehab PT Goals Patient Stated Goal: Walk again PT Goal Formulation: With patient Time For Goal Achievement: 08/07/18 Potential to Achieve Goals: Good    Frequency Min 3X/week   Barriers to discharge        Co-evaluation PT/OT/SLP Co-Evaluation/Treatment: Yes Reason for Co-Treatment: For patient/therapist safety;Complexity of the patient's impairments (multi-system involvement) PT goals addressed during session: Mobility/safety with mobility;Balance;Proper use of DME OT goals addressed during session: ADL's and self-care       AM-PAC PT "6 Clicks" Mobility  Outcome Measure Help needed turning from your back to your side while in a flat bed without using bedrails?: A Lot Help needed moving from lying on your back to sitting on the side of a flat bed without using bedrails?: A Lot Help needed moving to and from a bed to a chair (including a wheelchair)?: A Lot Help needed standing up from a chair using your arms (e.g., wheelchair or  bedside chair)?: A Lot Help needed to walk in hospital room?: Total Help needed climbing 3-5 steps with a railing? : Total 6 Click Score: 10    End of Session Equipment Utilized During Treatment: Gait belt Activity Tolerance: Patient limited by pain Patient left: in chair;with call bell/phone within reach;with chair alarm set Nurse Communication: Mobility status PT Visit Diagnosis: Unsteadiness on feet (R26.81);Pain;Difficulty in walking, not elsewhere classified (R26.2);History of falling (Z91.81);Muscle weakness (generalized) (M62.81) Pain - Right/Left: Right Pain - part of body: Leg    Time: 2841-3244 PT Time Calculation (min) (ACUTE ONLY): 27 min   Charges:   PT Evaluation $PT Eval Moderate Complexity: [place]  Pager [phone] Office [place] 07/24/2018, 1:24 PM

## 2018-07-24 NOTE — Telephone Encounter (Signed)
This is a [facility] pt 

## 2018-07-24 NOTE — Consult Note (Addendum)
Reason for Consult:Right pelvic fxs Referring Physician: Antonieta Pert  Brooke Mullins is an 84 y.o. female.  HPI: Brooke Mullins was at home yesterday and went to get out of her chair and fell. She's not quite sure what made her fall but denies syncope. She had immediate right hip pain and could not get up. She was brought to the ED for evaluation and x-rays/CT showed a left inferior pubic ramus fx and an acetabular fx in the setting of a prior hemiarthroplasty. She was admitted and orthopedic surgery was consulted. She c/o localized pain to the area.  Past Medical History:  Diagnosis Date  . Atrial fibrillation ([place])   . Carotid artery stenosis with cerebral infarction ([place])   . Carotid atherosclerosis, bilateral   . Chronic bronchitis ([place])   . Depression   . Dyslipidemia   . Edema   . History of blood transfusion 04/24/2018   "low HgB" (04/25/2018)  . Hyperplastic colon polyp   . Hypertension   . Hypothyroidism   . Osteoporosis   . Peripheral vascular disease of extremity ([place])   . PSVT (paroxysmal supraventricular tachycardia) ([place])   . Skin cancer    "scraped off my neck and legs" (04/25/2018)  . Subclavian artery stenosis [facility])     Past Surgical History:  Procedure Laterality Date  . ABDOMINAL HYSTERECTOMY    . APPENDECTOMY    . CARDIOVERSION N/A 10/17/2017   Procedure: CARDIOVERSION;  Surgeon: Sanda Klein, MD;  Location: [facility] ENDOSCOPY;  Service: Cardiovascular;  Laterality: N/A;  . CARDIOVERSION N/A 01/02/2018   Procedure: CARDIOVERSION;  Surgeon: Larey Dresser, MD;  Location: [facility] ENDOSCOPY;  Service: Cardiovascular;  Laterality: N/A;  . CATARACT EXTRACTION W/ INTRAOCULAR LENS  IMPLANT, BILATERAL Bilateral   . COLONOSCOPY W/ BIOPSIES AND POLYPECTOMY    . FRACTURE SURGERY    . HIP FRACTURE SURGERY Left 09/2006  . ORIF ORBITAL FRACTURE  01/07/2018   ORBITOTOMY ANTERIOR WITH DRAINAGE OF ORBITAL HEMATOMA, ORIF ORBITAL FRACTURE,ORBITAL DECOMPRESSION/care everywhere notes    Family  History  Problem Relation Age of Onset  . Heart failure Mother   . COPD Father   . Hypertension Brother   . Cancer Brother     Social History:  reports that she has quit smoking. Her smoking use included cigarettes. She has a 30.00 pack-year smoking history. She has never used smokeless tobacco. She reports that she does not drink alcohol or use drugs.  Allergies:  Allergies  Allergen Reactions  . Penicillins Anaphylaxis and Other (See Comments)    Has patient had a PCN reaction causing immediate rash, facial/tongue/throat swelling, SOB or lightheadedness with hypotension: Yes Has patient had a PCN reaction causing severe rash involving mucus membranes or skin necrosis: No Has patient had a PCN reaction that required hospitalization: No Has patient had a PCN reaction occurring within the last 10 years: No If all of the above answers are "NO", then may proceed with Cephalosporin use.   . Atorvastatin Other (See Comments)    Muscle pain  . Codeine Other (See Comments)    Nausea/Vomiting  . Colesevelam Other (See Comments)    Unsure of exact reaction type  . Ezetimibe Other (See Comments)    Pain in legs    Medications: I have reviewed the patient's current medications.  Results for orders placed or performed during the hospital encounter of 07/23/18 (from the past 48 hour(s))  Comprehensive metabolic panel     Status: Abnormal   Collection Time: 07/23/18 12:12 PM  Result Value  Ref Range   Sodium 138 135 - 145 mmol/L   Potassium 3.6 3.5 - 5.1 mmol/L   Chloride 99 98 - 111 mmol/L   CO2 25 22 - 32 mmol/L   Glucose, Bld 153 (H) 70 - 99 mg/dL   BUN 19 8 - 23 mg/dL   Creatinine, Ser 0.94 0.44 - 1.00 mg/dL   Calcium 8.9 8.9 - 10.3 mg/dL   Total Protein 6.3 (L) 6.5 - 8.1 g/dL   Albumin 2.9 (L) 3.5 - 5.0 g/dL   AST 33 15 - 41 U/L   ALT 31 0 - 44 U/L   Alkaline Phosphatase 58 38 - 126 U/L   Total Bilirubin 0.8 0.3 - 1.2 mg/dL   GFR calc non Af Amer 56 (L) >60 mL/min   GFR calc  Af Amer >60 >60 mL/min   Anion gap 14 5 - 15    Comment: Performed at [facility], [address] [place], [place] [address]  CBC with Differential     Status: Abnormal   Collection Time: 07/23/18 12:12 PM  Result Value Ref Range   WBC 13.0 (H) 4.0 - 10.5 K/uL   RBC 4.31 3.87 - 5.11 MIL/uL   Hemoglobin 9.9 (L) 12.0 - 15.0 g/dL   HCT 33.5 (L) 36.0 - 46.0 %   MCV 77.7 (L) 80.0 - 100.0 fL   MCH 23.0 (L) 26.0 - 34.0 pg   MCHC 29.6 (L) 30.0 - 36.0 g/dL   RDW 23.8 (H) 11.5 - 15.5 %   Platelets 284 150 - 400 K/uL   nRBC 0.2 0.0 - 0.2 %   Neutrophils Relative % 75 %   Neutro Abs 9.8 (H) 1.7 - 7.7 K/uL   Lymphocytes Relative 16 %   Lymphs Abs 2.1 0.7 - 4.0 K/uL   Monocytes Relative 6 %   Monocytes Absolute 0.8 0.1 - 1.0 K/uL   Eosinophils Relative 1 %   Eosinophils Absolute 0.1 0.0 - 0.5 K/uL   Basophils Relative 0 %   Basophils Absolute 0.0 0.0 - 0.1 K/uL   Smear Review MORPHOLOGY UNREMARKABLE    Immature Granulocytes 2 %   Abs Immature Granulocytes 0.23 (H) 0.00 - 0.07 K/uL    Comment: Performed at [facility], [place] [address] [place], [place] [address]  Protime-INR     Status: None   Collection Time: 07/23/18 12:12 PM  Result Value Ref Range   Prothrombin Time 14.3 11.4 - 15.2 seconds   INR 1.12     Comment: Performed at [facility] [address], [place], [place] [address]  I-stat troponin, ED     Status: None   Collection Time: 07/23/18 12:19 PM  Result Value Ref Range   Troponin i, poc 0.03 0.00 - 0.08 ng/mL   Comment 3            Comment: Due to the release kinetics of cTnI, a negative result within the first hours of the onset of symptoms does not rule out myocardial infarction with certainty. If myocardial infarction is still suspected, repeat the test at appropriate intervals.   I-Stat Arterial Blood Gas, ED - (order at [facility] and [facility] only)     Status: Abnormal   Collection Time: 07/23/18  1:36 PM  Result Value Ref Range   pH, Arterial 7.501 (H)  7.350 - 7.450   pCO2 arterial 35.5 32.0 - 48.0 mmHg   pO2, Arterial 51.0 (L) 83.0 - 108.0 mmHg   Bicarbonate 27.8 20.0 -  28.0 mmol/L   TCO2 29 22 - 32 mmol/L   O2 Saturation 89.0 %   Acid-Base Excess 4.0 (H) 0.0 - 2.0 mmol/L   Patient temperature HIDE    Sample type ARTERIAL   Urinalysis, Routine w reflex microscopic     Status: Abnormal   Collection Time: 07/23/18  3:16 PM  Result Value Ref Range   Color, Urine YELLOW (A) YELLOW    Comment: BIOCHEMICALS MAY BE AFFECTED BY COLOR CORRECTED ON 12/22 AT 1636: PREVIOUSLY REPORTED AS YELLOW    APPearance CLEAR (A) CLEAR   Specific Gravity, Urine 1.020 1.005 - 1.030   pH 7.0 5.0 - 8.0   Glucose, UA NEGATIVE NEGATIVE mg/dL   Hgb urine dipstick NEGATIVE NEGATIVE   Bilirubin Urine NEGATIVE NEGATIVE   Ketones, ur NEGATIVE NEGATIVE mg/dL   Protein, ur NEGATIVE NEGATIVE mg/dL   Nitrite NEGATIVE NEGATIVE   Leukocytes, UA POSITIVE (A) NEGATIVE    Comment: Performed at [facility] [address], [place], [place] [address]  Urinalysis, Microscopic (reflex)     Status: Abnormal   Collection Time: 07/23/18  3:16 PM  Result Value Ref Range   RBC / HPF NONE SEEN 0 - 5 RBC/hpf   WBC, UA 0-5 0 - 5 WBC/hpf   Bacteria, UA RARE (A) NONE SEEN   Squamous Epithelial / LPF 0-5 0 - 5    Comment: Performed at [facility], [place] [address], [place], [place] [address]  Basic metabolic panel     Status: Abnormal   Collection Time: 07/24/18  2:28 AM  Result Value Ref Range   Sodium 138 135 - 145 mmol/L   Potassium 4.3 3.5 - 5.1 mmol/L   Chloride 95 (L) 98 - 111 mmol/L   CO2 28 22 - 32 mmol/L   Glucose, Bld 124 (H) 70 - 99 mg/dL   BUN 21 8 - 23 mg/dL   Creatinine, Ser 0.95 0.44 - 1.00 mg/dL   Calcium 8.7 (L) 8.9 - 10.3 mg/dL   GFR calc non Af Amer 56 (L) >60 mL/min   GFR calc Af Amer >60 >60 mL/min   Anion gap 15 5 - 15    Comment: Performed at [facility] [address], [place], [place] [address]  CBC     Status: Abnormal    Collection Time: 07/24/18  2:28 AM  Result Value Ref Range   WBC 8.7 4.0 - 10.5 K/uL   RBC 3.75 (L) 3.87 - 5.11 MIL/uL   Hemoglobin 9.0 (L) 12.0 - 15.0 g/dL   HCT 28.2 (L) 36.0 - 46.0 %   MCV 75.2 (L) 80.0 - 100.0 fL   MCH 24.0 (L) 26.0 - 34.0 pg   MCHC 31.9 30.0 - 36.0 g/dL   RDW 23.7 (H) 11.5 - 15.5 %   Platelets 260 150 - 400 K/uL   nRBC 0.2 0.0 - 0.2 %    Comment: Performed at [facility], [place] [address], [place], [place] [address]    Dg Chest 1 View  Result Date: 07/23/2018 CLINICAL DATA:  Fall, pelvic fracture EXAM: CHEST  1 VIEW COMPARISON:  06/16/2018 FINDINGS: Lower lung volumes which accentuates the diffuse reticulonodular interstitial opacities throughout both lungs compared to the prior study. Difficult to exclude superimposed edema. Heart is enlarged. No large effusion or pneumothorax. Aorta atherosclerotic. Previous thoracic vertebral augmentations. Degenerative changes of the spine. Bones are osteopenic. Monitor leads overlie the chest. IMPRESSION: Lower lung volumes which appear to accentuate the  interstitial lung disease. Difficult to exclude superimposed edema Heart remains enlarged. Thoracic atherosclerosis Electronically Signed   By: Jerilynn Mages.  Shick M.D.   On: 07/23/2018 13:35   Ct Hip Right Wo Contrast  Result Date: 07/23/2018 CLINICAL DATA:  Fall.  Right hip pain. EXAM: CT OF THE RIGHT HIP WITHOUT CONTRAST TECHNIQUE: Multidetector CT imaging of the right hip was performed according to the standard protocol. Multiplanar CT image reconstructions were also generated. COMPARISON:  X-rays from earlier today. FINDINGS: Bones/Joint/Cartilage Status post right hip hemiarthroplasty. There is a comminuted fracture of the right acetabulum involving the medial wall and roof. Femoral component is located. There is no acute fracture of the right inferior pubic ramus with approximately 8-10 mm of bony over riding. Ligaments Suboptimally assessed by CT. Muscles and Tendons No  substantial hematoma within the right hip musculature. There is some hemorrhage in the right pelvic sidewall from the adjacent acetabular fracture. Soft tissues Bladder is markedly distended. IMPRESSION: Comminuted right acetabular fracture involving the medial wall and roof. The femoral component of the hemiarthroplasty remains located in the acetabulum. Associated acute fracture of the right inferior pubic ramus. Hemorrhage in the soft tissues of the right pelvic sidewall. Electronically Signed   By: Misty Stanley M.D.   On: 07/23/2018 16:30   Dg Hip Unilat W Or Wo Pelvis 2-3 Views Right  Result Date: 07/23/2018 CLINICAL DATA:  Fall, right hip injury, pain EXAM: DG HIP (WITH OR WITHOUT PELVIS) 2-3V RIGHT COMPARISON:  09/23/2013 FINDINGS: Bones are osteopenic. Degenerative changes of the lower lumbar spine, SI joints and left hip. Remote right hip arthroplasty. Cortical irregularity of the right acetabulum. Difficult to exclude nondisplaced right acetabular fracture. Also there is oblique lucency through the right inferior ramus compatible with a right inferior ramus fracture. No diastasis. Remainder of the pelvis intact. No subluxation dislocation. Peripheral atherosclerosis noted. IMPRESSION: Remote right hip arthroplasty Right inferior ramus fracture, suspect acute Cortical irregularity of the right acetabulum medially, difficult to exclude nondisplaced acetabular fracture in the setting of osteopenia. Electronically Signed   By: Jerilynn Mages.  Shick M.D.   On: 07/23/2018 13:32    Review of Systems  Constitutional: Negative for weight loss.  HENT: Negative for ear discharge, ear pain, hearing loss and tinnitus.   Eyes: Negative for blurred vision, double vision, photophobia and pain.  Respiratory: Negative for cough, sputum production and shortness of breath.   Cardiovascular: Negative for chest pain.  Gastrointestinal: Negative for abdominal pain, nausea and vomiting.  Genitourinary: Negative for dysuria,  flank pain, frequency and urgency.  Musculoskeletal: Positive for joint pain (Right hip). Negative for back pain, falls, myalgias and neck pain.  Neurological: Negative for dizziness, tingling, sensory change, focal weakness, loss of consciousness and headaches.  Endo/Heme/Allergies: Does not bruise/bleed easily.  Psychiatric/Behavioral: Negative for depression, memory loss and substance abuse. The patient is not nervous/anxious.    Blood pressure (!) 158/67, pulse 67, temperature 98.3 F (36.8 C), temperature source Oral, resp. rate (!) 21, height 5\' 4"  (1.626 m), weight 56.7 kg, SpO2 92 %. Physical Exam  Constitutional: She appears well-developed and well-nourished. No distress.  HENT:  Head: Normocephalic and atraumatic.  Eyes: Conjunctivae are normal. Right eye exhibits no discharge. Left eye exhibits no discharge. No scleral icterus.  Neck: Normal range of motion.  Cardiovascular: Normal rate and regular rhythm.  Respiratory: Effort normal. No respiratory distress.  Musculoskeletal:     Comments: RLE No traumatic wounds, ecchymosis, or rash  TTP hip  No knee or ankle effusion  Knee stable to varus/ valgus and anterior/posterior stress  Sens DPN, SPN, TN intact  Motor EHL, ext, flex, evers 5/5  DP 2+, PT 1+, No significant edema  Neurological: She is alert.  Skin: Skin is warm and dry. She is not diaphoretic.  Psychiatric: She has a normal mood and affect. Her behavior is normal.    Assessment/Plan: Right pelvic fxs -- Plan non-operative treatment with TDWB RLE. Should f/u with orthopedic surgeon in [place] who performed hemiarthroplasty ideally but may see Dr. Griffin Basil prn. Multiple medical problems including COPD, chronic resp failure, afib, HTN, depression, hypothyroidism, and GERD -- per primary team    Lisette Abu, PA-C Orthopedic Surgery [phone] 07/24/2018, 9:13 AM

## 2018-07-24 NOTE — Discharge Instructions (Signed)

## 2018-07-24 NOTE — Evaluation (Signed)
Occupational Therapy Evaluation Patient Details Name: Brooke Mullins MRN: [id number] DOB: 1934-07-03 Today's Date: 07/24/2018    History of Present Illness Pt is 84 yo female who fell at home and sustained R acetabular and pubic ramus fx in setting of past hemiarthroplasty. PMH: GERD, HTN, a fib, DM, PVD, and osteoporosis.   Clinical Impression   PTA, pt was living with her husband and was independent with ADLs per pt report. Pt currently requiring Min A for UB ADLs, Max A for LB ADLs, and Max A +2 for functional transfers with RW. Pt requiring assistance to maintain WB status. Pt presenting with decreased balance, strength, and activity tolerance due to pain. Pt would benefit from further acute OT to facilitate safe dc. Recommend dc to SNF for further OT to optimize safety, independence with ADLs, and return to PLOF.      Follow Up Recommendations  SNF;Supervision/Assistance - 24 hour    Equipment Recommendations  Other (comment)(Defer to next venue)    Recommendations for Other Services PT consult     Precautions / Restrictions Precautions Precautions: Fall Restrictions Weight Bearing Restrictions: Yes RLE Weight Bearing: Touchdown weight bearing      Mobility Bed Mobility Overal bed mobility: Needs Assistance Bed Mobility: Supine to Sit     Supine to sit: Max assist;+2 for physical assistance     General bed mobility comments: Max A +2 to manage RLE and faciltiate hips toward EOB with pad. Max A to elevated trunk  Transfers Overall transfer level: Needs assistance Equipment used: Rolling walker (2 wheeled) Transfers: Sit to/from [employer] Sit to Stand: Mod assist;+2 physical assistance;From elevated surface Stand pivot transfers: Max assist;+2 physical assistance       General transfer comment: Mod A +2 to power up into standing and maintain TDWB status. Max A for balance and RW management during pivot to left.     Balance Overall balance  assessment: Needs assistance Sitting-balance support: No upper extremity supported;Feet supported Sitting balance-Leahy Scale: Fair     Standing balance support: Bilateral upper extremity supported;During functional activity Standing balance-Leahy Scale: Poor Standing balance comment: Reliant on UE support                           ADL either performed or assessed with clinical judgement   ADL Overall ADL's : Needs assistance/impaired Eating/Feeding: Set up;Supervision/ safety;Sitting Eating/Feeding Details (indicate cue type and reason): Set up to eat lunch in recliner at end of session Grooming: Set up;Supervision/safety;Sitting   Upper Body Bathing: Minimal assistance;Sitting   Lower Body Bathing: Maximal assistance;+2 for physical assistance;Sit to/from stand   Upper Body Dressing : Minimal assistance;Sitting   Lower Body Dressing: Maximal assistance;+2 for physical assistance;Sit to/from stand Lower Body Dressing Details (indicate cue type and reason): Max A to don socks. Max A +2 for standing balance  Toilet Transfer: Maximal assistance;+2 for physical assistance;RW Toilet Transfer Details (indicate cue type and reason): Stand pivot to recliner to simulated toilet transfer. Max A +2 required and requiring assistance to maintain WB status         Functional mobility during ADLs: Maximal assistance;+2 for physical assistance;Rolling walker(stand pivot only) General ADL Comments: Pt presenting with decreased strength, balance, and activity tolerance due to pain.      Vision         Perception     Praxis      Pertinent Vitals/Pain Pain Assessment: Faces Faces Pain Scale: Hurts whole lot Pain  Location: RLE Pain Descriptors / Indicators: Discomfort;Grimacing;Guarding;Moaning;Shooting Pain Intervention(s): Monitored during session;Limited activity within patient's tolerance;Repositioned     Hand Dominance Right   Extremity/Trunk Assessment Upper  Extremity Assessment Upper Extremity Assessment: Generalized weakness   Lower Extremity Assessment Lower Extremity Assessment: Defer to PT evaluation   Cervical / Trunk Assessment Cervical / Trunk Assessment: Kyphotic   Communication Communication Communication: No difficulties   Cognition Arousal/Alertness: Awake/alert Behavior During Therapy: WFL for tasks assessed/performed Overall Cognitive Status: Within Functional Limits for tasks assessed                                     General Comments  SpO2 81-91 throughout session on 3-4L. RN notified    Exercises     Shoulder Instructions      Home Living Family/patient expects to be discharged to:: Private residence Living Arrangements: Spouse/significant other Available Help at Discharge: Family;Available 24 hours/day                         Home Equipment: Walker - 2 wheels;Cane - single point;Bedside commode;Shower seat;Wheelchair - manual   Additional Comments: Home O2 2L      Prior Functioning/Environment Level of Independence: Needs assistance  Gait / Transfers Assistance Needed: Reports she did not use DME PTA ADL's / Homemaking Assistance Needed: Reports she performed ADLs   Comments: Sister performs IADLs and husband drives        OT Problem List: Decreased strength;Decreased range of motion;Decreased activity tolerance;Impaired balance (sitting and/or standing);Decreased knowledge of use of DME or AE;Decreased knowledge of precautions;Pain      OT Treatment/Interventions: Self-care/ADL training;Therapeutic exercise;Energy conservation;DME and/or AE instruction;Therapeutic activities;Patient/family education    OT Goals(Current goals can be found in the care plan section) Acute Rehab OT Goals Patient Stated Goal: Walk again OT Goal Formulation: With patient Time For Goal Achievement: 08/07/18 Potential to Achieve Goals: Good  OT Frequency: Min 2X/week   Barriers to D/C:             Co-evaluation PT/OT/SLP Co-Evaluation/Treatment: Yes Reason for Co-Treatment: For patient/therapist safety;To address functional/ADL transfers   OT goals addressed during session: ADL's and self-care      AM-PAC OT "6 Clicks" Daily Activity     Outcome Measure Help from another person eating meals?: None Help from another person taking care of personal grooming?: A Little Help from another person toileting, which includes using toliet, bedpan, or urinal?: A Lot Help from another person bathing (including washing, rinsing, drying)?: A Lot Help from another person to put on and taking off regular upper body clothing?: A Little Help from another person to put on and taking off regular lower body clothing?: A Lot 6 Click Score: 16   End of Session Equipment Utilized During Treatment: Gait belt;Rolling walker;Oxygen(3-4L) Nurse Communication: Mobility status;Other (comment)(SpO2)  Activity Tolerance: Patient tolerated treatment well;Patient limited by pain Patient left: in chair;with call bell/phone within reach;with chair alarm set  OT Visit Diagnosis: Unsteadiness on feet (R26.81);Other abnormalities of gait and mobility (R26.89);Muscle weakness (generalized) (M62.81);Other symptoms and signs involving cognitive function;Pain Pain - Right/Left: Right Pain - part of body: Leg                Time: 1140-1206 OT Time Calculation (min): 26 min Charges:  OT General Charges $OT Visit: 1 Visit OT Evaluation $OT Eval Moderate Complexity: 1 Mod  Mariadel Mruk MSOT, OTR/L Acute  Rehab Pager: [phone] Office: [phone]  Theodoro Grist Nysir Fergusson 07/24/2018, 12:44 PM

## 2018-07-24 NOTE — Plan of Care (Signed)
Problem: Education: Goal: Knowledge of General Education information will improve Description Including pain rating scale, medication(s)/side effects and non-pharmacologic comfort measures 07/24/2018 0838 by Racheal Patches, RN Outcome: Progressing 07/23/2018 1848 by Racheal Patches, RN Outcome: Progressing   Problem: Health Behavior/Discharge Planning: Goal: Ability to manage health-related needs will improve 07/24/2018 0838 by Racheal Patches, RN Outcome: Progressing 07/23/2018 1848 by Racheal Patches, RN Outcome: Progressing   Problem: Clinical Measurements: Goal: Respiratory complications will improve 07/24/2018 0838 by Racheal Patches, RN Outcome: Progressing 07/23/2018 1848 by Racheal Patches, RN Outcome: Progressing Goal: Cardiovascular complication will be avoided Outcome: Progressing   Problem: Nutrition: Goal: Adequate nutrition will be maintained 07/24/2018 0838 by Racheal Patches, RN Outcome: Progressing 07/23/2018 1848 by Racheal Patches, RN Outcome: Progressing   Problem: Coping: Goal: Level of anxiety will decrease 07/24/2018 0838 by Racheal Patches, RN Outcome: Progressing 07/23/2018 1848 by Racheal Patches, RN Outcome: Progressing   Problem: Elimination: Goal: Will not experience complications related to urinary retention Outcome: Progressing   Problem: Pain Managment: Goal: General experience of comfort will improve 07/24/2018 0838 by Racheal Patches, RN Outcome: Progressing 07/23/2018 1848 by Racheal Patches, RN Outcome: Progressing   Problem: Safety: Goal: Ability to remain free from injury will improve 07/24/2018 0838 by Racheal Patches, RN Outcome: Progressing 07/23/2018 1848 by Racheal Patches, RN Outcome: Progressing   Problem: Skin Integrity: Goal: Risk for impaired skin integrity will decrease 07/24/2018 0838 by Racheal Patches, RN Outcome: Progressing 07/23/2018 1848 by Racheal Patches,  RN Outcome: Progressing

## 2018-07-25 ENCOUNTER — Inpatient Hospital Stay (HOSPITAL_COMMUNITY): Payer: Medicare Other

## 2018-07-25 DIAGNOSIS — Y92009 Unspecified place in unspecified non-institutional (private) residence as the place of occurrence of the external cause: Secondary | ICD-10-CM

## 2018-07-25 LAB — TSH: TSH: 8.611 u[IU]/mL — ABNORMAL HIGH (ref 0.350–4.500)

## 2018-07-25 MED ORDER — ALBUTEROL SULFATE (2.5 MG/3ML) 0.083% IN NEBU
INHALATION_SOLUTION | RESPIRATORY_TRACT | Status: AC
  Administered 2018-07-25: 2.5 mg
  Filled 2018-07-25: qty 3

## 2018-07-25 MED ORDER — ALBUTEROL SULFATE (2.5 MG/3ML) 0.083% IN NEBU
2.5000 mg | INHALATION_SOLUTION | RESPIRATORY_TRACT | Status: DC | PRN
  Administered 2018-07-27: 2.5 mg via RESPIRATORY_TRACT
  Filled 2018-07-25: qty 3

## 2018-07-25 MED ORDER — METHYLPREDNISOLONE SODIUM SUCC 125 MG IJ SOLR
60.0000 mg | Freq: Once | INTRAMUSCULAR | Status: AC
  Administered 2018-07-27: 60 mg via INTRAVENOUS

## 2018-07-25 MED ORDER — IPRATROPIUM-ALBUTEROL 0.5-2.5 (3) MG/3ML IN SOLN
3.0000 mL | Freq: Four times a day (QID) | RESPIRATORY_TRACT | Status: DC
  Administered 2018-07-25 (×3): 3 mL via RESPIRATORY_TRACT
  Filled 2018-07-25 (×3): qty 3

## 2018-07-25 MED ORDER — HYDROCODONE-ACETAMINOPHEN 5-325 MG PO TABS
1.0000 | ORAL_TABLET | Freq: Four times a day (QID) | ORAL | Status: DC | PRN
  Administered 2018-07-25 – 2018-08-03 (×11): 1 via ORAL
  Filled 2018-07-25 (×12): qty 1

## 2018-07-25 NOTE — Progress Notes (Signed)
Physical Therapy Treatment Patient Details Name: Brooke Mullins MRN: [id number] DOB: February 23, 1934 Today's Date: 07/25/2018    History of Present Illness Pt is 84 yo female who fell at home and sustained R acetabular and pubic ramus fx in setting of past hemiarthroplasty. PMH: GERD, HTN, a fib, DM, PVD, and osteoporosis.    PT Comments    Pt progressing very slowly, still screaming out with all mvmt of RLE, seems to be having spasms on that side. Max A +2 for bed mobility and transfer to chair and continues to be unable to progress ambulation due to pain and inability to keep RLE TDWB. PT will continue to follow.    Follow Up Recommendations  SNF;Supervision/Assistance - 24 hour     Equipment Recommendations  None recommended by PT    Recommendations for Other Services       Precautions / Restrictions Precautions Precautions: Fall Restrictions Weight Bearing Restrictions: Yes RLE Weight Bearing: Touchdown weight bearing    Mobility  Bed Mobility Overal bed mobility: Needs Assistance Bed Mobility: Supine to Sit     Supine to sit: Max assist;+2 for physical assistance     General bed mobility comments: Max A +2 to manage RLE and facilitate hips toward EOB with pad. Max A to elevated trunk  Transfers Overall transfer level: Needs assistance Equipment used: Rolling walker (2 wheeled) Transfers: Sit to/from [employer] Sit to Stand: Mod assist;+2 physical assistance;From elevated surface Stand pivot transfers: Max assist;+2 physical assistance       General transfer comment: Mod A +2 to power up into standing. BHHA to pivot to chair with therapist's foot under pt's R foot to maintain TDWB   Ambulation/Gait             General Gait Details: unable to ambulate and keep WB status   Stairs             Wheelchair Mobility    Modified Rankin (Stroke Patients Only)       Balance Overall balance assessment: Needs  assistance Sitting-balance support: No upper extremity supported;Feet supported Sitting balance-Leahy Scale: Fair Sitting balance - Comments: posterior lean Postural control: Posterior lean Standing balance support: Bilateral upper extremity supported;During functional activity Standing balance-Leahy Scale: Poor Standing balance comment: Reliant on UE support                            Cognition Arousal/Alertness: Awake/alert Behavior During Therapy: WFL for tasks assessed/performed Overall Cognitive Status: Within Functional Limits for tasks assessed                                        Exercises General Exercises - Lower Extremity Ankle Circles/Pumps: AROM;Both;10 reps;Supine Quad Sets: AROM;Both;10 reps;Supine Long Arc Quad: AROM;Right;10 reps;Seated    General Comments General comments (skin integrity, edema, etc.): pt screams out with all mvmt of RLE, seems to be having spasms in that LE      Pertinent Vitals/Pain Pain Assessment: 0-10 Pain Score: 10-Worst pain ever Pain Location: RLE Pain Descriptors / Indicators: Discomfort;Grimacing;Guarding;Moaning;Shooting;Cramping Pain Intervention(s): Limited activity within patient's tolerance;Monitored during session    Home Living                      Prior Function            PT Goals (current goals can now  be found in the care plan section) Acute Rehab PT Goals Patient Stated Goal: Walk again PT Goal Formulation: With patient Time For Goal Achievement: 08/07/18 Potential to Achieve Goals: Good Progress towards PT goals: Progressing toward goals    Frequency    Min 2X/week      PT Plan Current plan remains appropriate    Co-evaluation              AM-PAC PT "6 Clicks" Mobility   Outcome Measure  Help needed turning from your back to your side while in a flat bed without using bedrails?: A Lot Help needed moving from lying on your back to sitting on the side of  a flat bed without using bedrails?: A Lot Help needed moving to and from a bed to a chair (including a wheelchair)?: A Lot Help needed standing up from a chair using your arms (e.g., wheelchair or bedside chair)?: A Lot Help needed to walk in hospital room?: Total Help needed climbing 3-5 steps with a railing? : Total 6 Click Score: 10    End of Session Equipment Utilized During Treatment: Gait belt Activity Tolerance: Patient limited by pain Patient left: in chair;with call bell/phone within reach;with chair alarm set;with family/visitor present Nurse Communication: Mobility status PT Visit Diagnosis: Unsteadiness on feet (R26.81);Pain;Difficulty in walking, not elsewhere classified (R26.2);History of falling (Z91.81);Muscle weakness (generalized) (M62.81) Pain - Right/Left: Right Pain - part of body: Leg     Time: 1110-1134 PT Time Calculation (min) (ACUTE ONLY): 24 min  Charges:  $Therapeutic Exercise: 8-22 mins $Therapeutic Activity: 8-22 mins                     [place]  Pager [phone] Office Advance 07/25/2018, 1:24 PM

## 2018-07-25 NOTE — Progress Notes (Signed)
PROGRESS NOTE    Brooke Mullins  WLS:[id number] DOB: 1934-02-02 DOA: 07/23/2018 PCP: [name], MD   Brief Narrative: 84 year old female with history of pulmonary fibrosis chronic hypoxic respiratory failure on 3 to nasal cannula came to the ER after fall at home.  In the ER found to have right pelvic fracture, was admitted for pain control, nonoperative management.  Assessment & Plan:   Right inferior rami fracture along with intractable pain: Overall pain is a stable, still needing IV morphine Tylenol.  Add oral opiates, wean off IV morphine. Cont PT OT. She will need SNF ,awaiting on approval. She lives with her elderly husband. Apprecaite orthopedic input.  COPD/pulmonary fibrosis with chronic hypoxic respiratory failure.  Continue on nasal cannula oxygen to maintain at least 89% or above.  Continue on pulmonary toileting/nebulizer.  Continue her home prednisone, bronchodilator nebs, respiratory therapy on board- I discussed this morning..  Initial mild leukocytosis likely reactive,resolved.  Hypertension: BP well controlled, Continue Lopressor.  Depression : Mood is stable.  Continue her Lexapro.  Persistent atrial fibrillation: Rate controlled . Cont her amiodarone, Eliquis, Lopressor.  Hypothyroidism: On Synthroid.  Latest TSH 6.1 in September.   DVT prophylaxis: Eliquis  Code Status: DNR Family Communication: Updated patient's daughter over the phone.   Disposition Plan: skilled nursing facility once bed available   Consultants: orthopedics  Procedures: none  Antimicrobials: none  Subjective: Resting comfortably.No new complaints. Patient has been intermittently hypoxic but asymptomatic, she is on home oxygen setting.  Objective: Vitals:   07/25/18 0857 07/25/18 1221 07/25/18 1519 07/25/18 1545  BP: (!) 141/55  (!) 112/50   Pulse: 79  81   Resp: (!) 22  15   Temp:   (!) 97.4 F (36.3 C)   TempSrc:   Oral   SpO2: 91% (!) 89% (!) 70% (!) 87%  Weight:       Height:        Intake/Output Summary (Last 24 hours) at 07/25/2018 1648 Last data filed at 07/25/2018 1418 Gross per 24 hour  Intake 720 ml  Output 700 ml  Net 20 ml   Filed Weights   07/23/18 1219  Weight: 56.7 kg   Weight change:   Body mass index is 21.46 kg/m.  Examination:  General exam: Calm, comfortable, not in acute distress,average built.  HEENT:Oral mucosa moist, Ear/Nose WNL grossly, dentition normal. Respiratory system: Bilateral equal air entry, diminished at the base, no crackles, no use of accessory muscle. Cardiovascular system: regular rate and rhythm, S1 & S2 heard, No JVD/murmurs.No pedal edema. Gastrointestinal system: Abdomen soft, nontender non-distended, BS +. No hepatosplenomegaly palpable. Nervous System:Alert, awake and oriented at baseline.Able to move UE and LE, sensation intact. Extremities: No edema, distal peripheral pulses palpable.  Tenderness on the right pelvic area Skin: No rashes,no icterus. MSK: Normal muscle bulk,tone ,power Data Reviewed: I have personally reviewed following labs and imaging studies  CBC: Recent Labs  Lab 07/23/18 1212 07/24/18 0228  WBC 13.0* 8.7  NEUTROABS 9.8*  --   HGB 9.9* 9.0*  HCT 33.5* 28.2*  MCV 77.7* 75.2*  PLT 284 811   Basic Metabolic Panel: Recent Labs  Lab 07/23/18 1212 07/24/18 0228  NA 138 138  K 3.6 4.3  CL 99 95*  CO2 25 28  GLUCOSE 153* 124*  BUN 19 21  CREATININE 0.94 0.95  CALCIUM 8.9 8.7*   GFR: Estimated Creatinine Clearance: 39.4 mL/min (by C-G formula based on SCr of 0.95 mg/dL). Liver Function Tests: Recent Labs  Lab 07/23/18 1212  AST 33  ALT 31  ALKPHOS 58  BILITOT 0.8  PROT 6.3*  ALBUMIN 2.9*   No results for input(s): LIPASE, AMYLASE in the last 168 hours. No results for input(s): AMMONIA in the last 168 hours. Coagulation Profile: Recent Labs  Lab 07/23/18 1212  INR 1.12   Cardiac Enzymes: No results for input(s): CKTOTAL, CKMB, CKMBINDEX,  TROPONINI in the last 168 hours. BNP (last 3 results) Recent Labs    05/12/18 1020  PROBNP 2,040*   HbA1C: No results for input(s): HGBA1C in the last 72 hours. CBG: No results for input(s): GLUCAP in the last 168 hours. Lipid Profile: No results for input(s): CHOL, HDL, LDLCALC, TRIG, CHOLHDL, LDLDIRECT in the last 72 hours. Thyroid Function Tests: Recent Labs    07/25/18 0326  TSH 8.611*   Anemia Panel: No results for input(s): VITAMINB12, FOLATE, FERRITIN, TIBC, IRON, RETICCTPCT in the last 72 hours. Sepsis Labs: No results for input(s): PROCALCITON, LATICACIDVEN in the last 168 hours.  No results found for this or any previous visit (from the past 240 hour(s)).    Radiology Studies: No results found.   Scheduled Meds: . amiodarone  200 mg Oral Daily  . apixaban  2.5 mg Oral BID  . escitalopram  20 mg Oral QHS  . furosemide  40 mg Oral Daily  . ipratropium-albuterol  3 mL Nebulization QID  . iron polysaccharides  150 mg Oral Daily  . levothyroxine  75 mcg Oral QAC breakfast  . metoprolol tartrate  12.5 mg Oral BID  . pantoprazole  40 mg Oral Daily  . potassium chloride SA  20 mEq Oral Daily  . predniSONE  20 mg Oral Q breakfast   Continuous Infusions:   LOS: 2 days   Time spent: More than 50% of that time was spent in counseling and/or coordination of care.  Antonieta Pert, MD [facility] Pager [phone]  If 7PM-7AM, please contact night-coverage [email or web address] Password [facility] 07/25/2018, 4:48 PM

## 2018-07-25 NOTE — Progress Notes (Signed)
RT contacted MD seeing pt for clarification on ABG that was ordered STAT prior to shift start. MD agreed that pt did not need an ABG and told this RT to cancel order d/t it being an unnecessary test for this pt. RT advised RN of this information/communication with MD as well. RT will continue to monitor.

## 2018-07-25 NOTE — Plan of Care (Signed)
  Problem: Education: Goal: Knowledge of General Education information will improve Description: Including pain rating scale, medication(s)/side effects and non-pharmacologic comfort measures Outcome: Progressing   Problem: Activity: Goal: Risk for activity intolerance will decrease Outcome: Progressing   Problem: Coping: Goal: Level of anxiety will decrease Outcome: Progressing   Problem: Elimination: Goal: Will not experience complications related to bowel motility Outcome: Progressing   Problem: Pain Managment: Goal: General experience of comfort will improve Outcome: Progressing   Problem: Skin Integrity: Goal: Risk for impaired skin integrity will decrease Outcome: Progressing   

## 2018-07-26 ENCOUNTER — Inpatient Hospital Stay (HOSPITAL_COMMUNITY): Payer: Medicare Other

## 2018-07-26 DIAGNOSIS — J9621 Acute and chronic respiratory failure with hypoxia: Secondary | ICD-10-CM | POA: Diagnosis present

## 2018-07-26 DIAGNOSIS — I10 Essential (primary) hypertension: Secondary | ICD-10-CM

## 2018-07-26 DIAGNOSIS — E039 Hypothyroidism, unspecified: Secondary | ICD-10-CM

## 2018-07-26 LAB — PROCALCITONIN: Procalcitonin: 0.46 ng/mL

## 2018-07-26 LAB — BRAIN NATRIURETIC PEPTIDE: B Natriuretic Peptide: 1180.8 pg/mL — ABNORMAL HIGH (ref 0.0–100.0)

## 2018-07-26 MED ORDER — METHOCARBAMOL 1000 MG/10ML IJ SOLN
500.0000 mg | Freq: Once | INTRAVENOUS | Status: AC
  Administered 2018-07-26: 500 mg via INTRAVENOUS
  Filled 2018-07-26 (×2): qty 5

## 2018-07-26 MED ORDER — FUROSEMIDE 10 MG/ML IJ SOLN: 40.0000 mg | Freq: Once | INTRAMUSCULAR | Status: AC

## 2018-07-26 MED ORDER — FUROSEMIDE 10 MG/ML IJ SOLN
INTRAMUSCULAR | Status: AC
  Administered 2018-07-26: 40 mg via INTRAVENOUS
  Filled 2018-07-26: qty 4

## 2018-07-26 MED ORDER — FUROSEMIDE 10 MG/ML IJ SOLN
40.0000 mg | Freq: Once | INTRAMUSCULAR | Status: AC
  Administered 2018-07-26 (×2): 40 mg via INTRAVENOUS
  Filled 2018-07-26: qty 4

## 2018-07-26 NOTE — Progress Notes (Signed)
RN called regarding pt desating to low 80's on NRB, patient transferred from 5N, RN Jarrett Soho noted pt in distress on her arrival. Dr. Wynelle Cleveland paged prior to my arrival. New orders given for 40 mg Lasix IVP, 1 mg Morphine IVP and Bipap which were all completed. RT steve at bedside to place pt on Bipap. No intervention from RRT.

## 2018-07-26 NOTE — Progress Notes (Signed)
PROGRESS NOTE    Brooke Mullins   YTK:[id number]  DOB: [birth year]  DOA: 07/23/2018 PCP: Brooke Dress, MD   Brief Narrative:  [name] 84 year old female with history of pulmonary fibrosis chronic hypoxic respiratory failure on 3  nasal cannula, A- fib, pulm HTN, PVD came to the ER after fall at home.  In the ER found to have right pelvic fracture, was admitted for pain control, nonoperative management. She apparently developed respiratory distress on the night of 12/24 and she was placed on an non-re breather. IV Lasix and Solumedrol were given.  Admitted from 04/25/18- 04/28/18.   Subjective: She in on a non rebreather. Has shortness of breath when it is taken off. No cough. No chest pain.     Assessment & Plan:   Principal Problem:   Acute on chronic respiratory failure with hypoxia  H/o pulmonary fibrosis on Prednisone - at baseline on 3 L - now on 100% since last night- she does not appear to need Narcan as she is awake and alert - last admitted in Sept with acute resp failure from CHF, ECHO showed severe concentric LV wall thickening with speckled intraventricular septum suggestive of infiltrative cardiomyopathy such as amyloidosis.  - about a month ago, she was diagnosed by a pulmonologist in [place] to have pulmonary fibrosis and appears to have been started on prednisone-  - CXR yesterday suggestive of pulm edema- given Lasix but BP is dropping to low - check chest CT stat, pro calcitonin to r/u infection- I suspect BNP will be high due to above and may not be helpful but will check - transfer to SDU  Active Problems:     Persistent atrial fibrillation - cont Amio, Eliquis, Metoprolol, Amio - rate controlled  ? Amyloidosis / chronic diastolic CHF - see above in regards to diuretics - cardiology was considering a MRI in the future   Pelvic fracture/   Fall at home, initial encounter - pain control    Hypothyroidism - Synthroid  DVT prophylaxis:  Eliquis Code Status: DNR Family Communication: daughter and son Disposition Plan: transfer to SDU Consultants:   none Procedures:   none Antimicrobials:  Anti-infectives (From admission, onward)   None       Objective: Vitals:   07/26/18 0730 07/26/18 0735 07/26/18 1045 07/26/18 1357  BP:   (!) 122/55 99/64  Pulse:   83 73  Resp:   16 16  Temp:    98 F (36.7 C)  TempSrc:    Axillary  SpO2: (!) 76% 90% 95% 97%  Weight:      Height:        Intake/Output Summary (Last 24 hours) at 07/26/2018 1604 Last data filed at 07/26/2018 1330 Gross per 24 hour  Intake 440 ml  Output 1001 ml  Net -561 ml   Filed Weights   07/23/18 1219  Weight: 56.7 kg    Examination: General exam: Appears comfortable  HEENT: PERRLA, oral mucosa moist, no sclera icterus or thrush Respiratory system: bilateral faint crackles, Respiratory effort normal. Cardiovascular system: S1 & S2 heard, RRR.   Gastrointestinal system: Abdomen soft, non-tender, nondistended. Normal bowel sounds. Central nervous system: Alert and oriented. No focal neurological deficits. Extremities: No cyanosis, clubbing or edema Skin: No rashes or ulcers Psychiatry:  Mood & affect appropriate.     Data Reviewed: I have personally reviewed following labs and imaging studies  CBC: Recent Labs  Lab 07/23/18 1212 07/24/18 0228  WBC 13.0* 8.7  NEUTROABS 9.8*  --  HGB 9.9* 9.0*  HCT 33.5* 28.2*  MCV 77.7* 75.2*  PLT 284 144   Basic Metabolic Panel: Recent Labs  Lab 07/23/18 1212 07/24/18 0228  NA 138 138  K 3.6 4.3  CL 99 95*  CO2 25 28  GLUCOSE 153* 124*  BUN 19 21  CREATININE 0.94 0.95  CALCIUM 8.9 8.7*   GFR: Estimated Creatinine Clearance: 39.4 mL/min (by C-G formula based on SCr of 0.95 mg/dL). Liver Function Tests: Recent Labs  Lab 07/23/18 1212  AST 33  ALT 31  ALKPHOS 58  BILITOT 0.8  PROT 6.3*  ALBUMIN 2.9*   No results for input(s): LIPASE, AMYLASE in the last 168 hours. No  results for input(s): AMMONIA in the last 168 hours. Coagulation Profile: Recent Labs  Lab 07/23/18 1212  INR 1.12   Cardiac Enzymes: No results for input(s): CKTOTAL, CKMB, CKMBINDEX, TROPONINI in the last 168 hours. BNP (last 3 results) Recent Labs    05/12/18 1020  PROBNP 2,040*   HbA1C: No results for input(s): HGBA1C in the last 72 hours. CBG: No results for input(s): GLUCAP in the last 168 hours. Lipid Profile: No results for input(s): CHOL, HDL, LDLCALC, TRIG, CHOLHDL, LDLDIRECT in the last 72 hours. Thyroid Function Tests: Recent Labs    07/25/18 0326  TSH 8.611*   Anemia Panel: No results for input(s): VITAMINB12, FOLATE, FERRITIN, TIBC, IRON, RETICCTPCT in the last 72 hours. Urine analysis:    Component Value Date/Time   COLORURINE YELLOW (A) 07/23/2018 1516   APPEARANCEUR CLEAR (A) 07/23/2018 1516   LABSPEC 1.020 07/23/2018 1516   PHURINE 7.0 07/23/2018 1516   GLUCOSEU NEGATIVE 07/23/2018 1516   HGBUR NEGATIVE 07/23/2018 1516   BILIRUBINUR NEGATIVE 07/23/2018 1516   KETONESUR NEGATIVE 07/23/2018 1516   PROTEINUR NEGATIVE 07/23/2018 1516   NITRITE NEGATIVE 07/23/2018 1516   LEUKOCYTESUR POSITIVE (A) 07/23/2018 1516   Sepsis Labs: @LABRCNTIP (procalcitonin:4,lacticidven:4) )No results found for this or any previous visit (from the past 240 hour(s)).       Radiology Studies: Dg Chest Port 1 View  Result Date: 07/25/2018 CLINICAL DATA:  Shortness of breath. Acute right acetabular fracture. EXAM: PORTABLE CHEST 1 VIEW COMPARISON:  07/23/2018 FINDINGS: Moderate enlargement of the cardiopericardial silhouette with bilateral diffuse interstitial accentuation. Faint Kerley B lines. Atherosclerotic calcification of the aortic arch. The airspace opacities on the prior exam have mildly improved. Tortuous thoracic aorta. Two midthoracic vertebral levels demonstrate vertebral augmentation. Bony demineralization. IMPRESSION: 1. Interstitial pulmonary edema is  present, although improved compared to 07/23/2018. 2. Moderate cardiomegaly. 3.  Aortic Atherosclerosis (ICD10-I70.0). Electronically Signed   By: Van Clines M.D.   On: 07/25/2018 19:53      Scheduled Meds: . amiodarone  200 mg Oral Daily  . apixaban  2.5 mg Oral BID  . escitalopram  20 mg Oral QHS  . iron polysaccharides  150 mg Oral Daily  . levothyroxine  75 mcg Oral QAC breakfast  . methylPREDNISolone (SOLU-MEDROL) injection  60 mg Intravenous Once  . metoprolol tartrate  12.5 mg Oral BID  . pantoprazole  40 mg Oral Daily  . potassium chloride SA  20 mEq Oral Daily  . predniSONE  20 mg Oral Q breakfast   Continuous Infusions:   LOS: 3 days    Time spent in minutes: 35    Debbe Odea, MD [facility] Pager: [email or web address] Password [email or web address] 07/26/2018, 4:04 PM

## 2018-07-26 NOTE — Progress Notes (Signed)
Patient arrived to unit from CT on a NRB-desating into high 70's and low 80's with signs of distress. Provided pt with PRN morphine 1mg  IV. And Notified RRT and Dr. Wynelle Cleveland: new orders given: 40mg  Lasix and Bipap to keep sats 90%. All completed.  RT Ladene Artist to bedside.  Patient family to bedside and explained use of Bipap.  Will continue to monitor.

## 2018-07-26 NOTE — Progress Notes (Signed)
Continues 100% NRB.  Desats low 70's when mask is off.  Patient states comfort at this time without distress.

## 2018-07-27 DIAGNOSIS — I5033 Acute on chronic diastolic (congestive) heart failure: Secondary | ICD-10-CM

## 2018-07-27 DIAGNOSIS — J849 Interstitial pulmonary disease, unspecified: Secondary | ICD-10-CM | POA: Diagnosis present

## 2018-07-27 LAB — CK TOTAL AND CKMB (NOT AT ARMC)
CK, MB: 1.1 ng/mL (ref 0.5–5.0)
Relative Index: INVALID (ref 0.0–2.5)
Total CK: 14 U/L — ABNORMAL LOW (ref 38–234)

## 2018-07-27 LAB — RESPIRATORY PANEL BY PCR
Adenovirus: NOT DETECTED
Bordetella pertussis: NOT DETECTED
Chlamydophila pneumoniae: NOT DETECTED
Coronavirus 229E: NOT DETECTED
Coronavirus HKU1: NOT DETECTED
Coronavirus NL63: NOT DETECTED
Coronavirus OC43: NOT DETECTED
Influenza A: NOT DETECTED
Influenza B: NOT DETECTED
Metapneumovirus: NOT DETECTED
Mycoplasma pneumoniae: NOT DETECTED
PARAINFLUENZA VIRUS 4-RVPPCR: NOT DETECTED
Parainfluenza Virus 1: NOT DETECTED
Parainfluenza Virus 2: NOT DETECTED
Parainfluenza Virus 3: NOT DETECTED
Respiratory Syncytial Virus: NOT DETECTED
Rhinovirus / Enterovirus: NOT DETECTED

## 2018-07-27 LAB — HEPARIN LEVEL (UNFRACTIONATED): Heparin Unfractionated: 1.34 IU/mL — ABNORMAL HIGH (ref 0.30–0.70)

## 2018-07-27 LAB — CBC
HEMATOCRIT: 29.4 % — AB (ref 36.0–46.0)
Hemoglobin: 9 g/dL — ABNORMAL LOW (ref 12.0–15.0)
MCH: 22.9 pg — ABNORMAL LOW (ref 26.0–34.0)
MCHC: 30.6 g/dL (ref 30.0–36.0)
MCV: 74.8 fL — ABNORMAL LOW (ref 80.0–100.0)
Platelets: 238 10*3/uL (ref 150–400)
RBC: 3.93 MIL/uL (ref 3.87–5.11)
RDW: 22.5 % — ABNORMAL HIGH (ref 11.5–15.5)
WBC: 11.3 10*3/uL — ABNORMAL HIGH (ref 4.0–10.5)
nRBC: 0 % (ref 0.0–0.2)

## 2018-07-27 LAB — BASIC METABOLIC PANEL
Anion gap: 10 (ref 5–15)
BUN: 17 mg/dL (ref 8–23)
CO2: 29 mmol/L (ref 22–32)
CREATININE: 0.89 mg/dL (ref 0.44–1.00)
Calcium: 8.4 mg/dL — ABNORMAL LOW (ref 8.9–10.3)
Chloride: 92 mmol/L — ABNORMAL LOW (ref 98–111)
GFR calc Af Amer: >60 mL/min (ref >60)
GFR calc non Af Amer: >60 mL/min (ref >60)
Glucose, Bld: 122 mg/dL — ABNORMAL HIGH (ref 70–99)
Potassium: 4.1 mmol/L (ref 3.5–5.1)
Sodium: 131 mmol/L — ABNORMAL LOW (ref 135–145)

## 2018-07-27 LAB — APTT: APTT: 32 s (ref 24–36)

## 2018-07-27 LAB — LACTIC ACID, PLASMA: Lactic Acid, Venous: 1.6 mmol/L (ref 0.5–1.9)

## 2018-07-27 LAB — PROCALCITONIN: Procalcitonin: 0.47 ng/mL

## 2018-07-27 LAB — SEDIMENTATION RATE: Sed Rate: 72 mm/hr — ABNORMAL HIGH (ref 0–22)

## 2018-07-27 LAB — LIPASE, BLOOD: Lipase: 52 U/L — ABNORMAL HIGH (ref 11–51)

## 2018-07-27 MED ORDER — FUROSEMIDE 10 MG/ML IJ SOLN
40.0000 mg | Freq: Three times a day (TID) | INTRAMUSCULAR | Status: DC
  Administered 2018-07-27 – 2018-07-28 (×3): 40 mg via INTRAVENOUS
  Filled 2018-07-27 (×3): qty 4

## 2018-07-27 MED ORDER — FUROSEMIDE 10 MG/ML IJ SOLN: 40.0000 mg | Freq: Once | INTRAMUSCULAR | Status: DC

## 2018-07-27 MED ORDER — METHYLPREDNISOLONE SODIUM SUCC 125 MG IJ SOLR
60.0000 mg | Freq: Four times a day (QID) | INTRAMUSCULAR | Status: DC
  Administered 2018-07-27 – 2018-07-29 (×8): 60 mg via INTRAVENOUS
  Filled 2018-07-27 (×9): qty 2

## 2018-07-27 MED ORDER — ENSURE ENLIVE PO LIQD
237.0000 mL | Freq: Two times a day (BID) | ORAL | Status: DC
  Administered 2018-07-28 – 2018-08-04 (×9): 237 mL via ORAL

## 2018-07-27 MED ORDER — HEPARIN (PORCINE) 25000 UT/250ML-% IV SOLN
900.0000 [IU]/h | INTRAVENOUS | Status: DC
  Administered 2018-07-27: 750 [IU]/h via INTRAVENOUS
  Administered 2018-07-28 – 2018-07-29 (×2): 1100 [IU]/h via INTRAVENOUS
  Administered 2018-07-30: 1050 [IU]/h via INTRAVENOUS
  Filled 2018-07-27 (×4): qty 250

## 2018-07-27 MED ORDER — ADULT MULTIVITAMIN W/MINERALS CH
1.0000 | ORAL_TABLET | Freq: Every day | ORAL | Status: DC
  Administered 2018-07-27 – 2018-08-04 (×9): 1 via ORAL
  Filled 2018-07-27 (×10): qty 1

## 2018-07-27 NOTE — Progress Notes (Signed)
PROGRESS NOTE    Brooke Mullins   QIH:[id number]  DOB: Sep 15, 1933  DOA: 07/23/2018 PCP: [name], MD   Brief Narrative:  [name] 84 year old female with history of recently diagnosed pulmonary fibrosis chronic hypoxic respiratory failure on 2 L nasal cannula just prior to admission, persistent A- fib, secondary pulm HTN, PVD came to the ER after fall at home.    Admitted from 04/25/18- 04/28/18 to the cardiology service (as a transfer from [place]) for dyspnea with a productive cough, hypoxia needing a BiPAP. She was diuresed and was weaned down to about 3-4 l O2 when discharged.  She followed up later with a pulmonologist in [place], Dr Bernette Mayers, underwent a lung biopsy and was diagnosed with IPF and started on 20 mg of Prednisone daily. She was also prescribed another medications which they are trying to get approved through her insurance.  She had noted more shortness of breath the day before the fall that brought her to the ED.   In the ER, she was found to have right pelvic fracture, was admitted for pain control & nonoperative management. H and P notes that her pulse ox was 81% the day before.  I started to see her on 12/25 and noted she was on 100% FiO2 via a Face mask. It was noted the night before the she had become more hypoxic. A dose of IV Solumedrol and one of IV Lasix were given. CXR showed interstitial pulmonary edema.  Subjective: On BiPAP. Feels short of breath when it is removed. No other complaints. Pelvis does not hurt unless she is moved but is now able to move her legs without pelvic pain.    Assessment & Plan:   Principal Problem:   Acute on chronic respiratory failure with hypoxia  H/o pulmonary fibrosis on Prednisone - last admitted in Sept with acute resp failure from CHF, ECHO showed severe concentric LV wall thickening with speckled intraventricular septum suggestive of infiltrative cardiomyopathy such as amyloidosis.  - CXR suggestive of pulm  edema- given multiple doses of Lasix   - 12/25- not improving- checked chest CT- showed b/l ground glass infiltrates- checked pro calcitonin which was negative - transfered to SDU for BiPAP on 12/25- given further Lasix- home Prednisone being continued - 12/26 - not improving- cardiology and pulmonary consults requested  Active Problems:     Persistent atrial fibrillation - cont Amio, Eliquis, Metoprolol, Amio - rate controlled    Infiltrative cardiomyopathy- ?Amyloidosis / chronic diastolic CHF/ secondary pulm HTN - see above in regards to diuretics - cardiology was considering a MRI in the future   Pelvic fracture/   Fall at home, initial encounter - pain control    Hypothyroidism - Synthroid  DVT prophylaxis: Eliquis Code Status: DNR Family Communication: daughter and son Disposition Plan:  Follow in SDU Consultants:   PCCM  Cardiology Procedures:   none Antimicrobials:  Anti-infectives (From admission, onward)   None       Objective: Vitals:   07/27/18 0745 07/27/18 0924 07/27/18 1158 07/27/18 1249  BP:    (!) 136/50  Pulse:      Resp:      Temp: (!) 97 F (36.1 C)  98.4 F (36.9 C)   TempSrc: Axillary  Axillary   SpO2:  (!) 86%  90%  Weight:      Height:        Intake/Output Summary (Last 24 hours) at 07/27/2018 1321 Last data filed at 07/26/2018 1330 Gross per 24 hour  Intake 240 ml  Output -  Net 240 ml   Filed Weights   07/23/18 1219 07/27/18 0554  Weight: 56.7 kg 61.2 kg    Examination: General exam: Appears comfortable  HEENT: PERRLA, oral mucosa moist, no sclera icterus or thrush Respiratory system: on BiPAP- 100% FiO2- RR in 20s- mild crackles b/l lung fields Cardiovascular system: S1 & S2 heard,  No murmurs  Gastrointestinal system: Abdomen soft, non-tender, nondistended. Normal bowel sound. No organomegaly Central nervous system: Alert and oriented. No focal neurological deficits. Extremities: No cyanosis, clubbing or edema Skin:  No rashes or ulcers Psychiatry:  Mood & affect appropriate.     Data Reviewed: I have personally reviewed following labs and imaging studies  CBC: Recent Labs  Lab 07/23/18 1212 07/24/18 0228 07/27/18 0148  WBC 13.0* 8.7 11.3*  NEUTROABS 9.8*  --   --   HGB 9.9* 9.0* 9.0*  HCT 33.5* 28.2* 29.4*  MCV 77.7* 75.2* 74.8*  PLT 284 260 740   Basic Metabolic Panel: Recent Labs  Lab 07/23/18 1212 07/24/18 0228 07/27/18 0148  NA 138 138 131*  K 3.6 4.3 4.1  CL 99 95* 92*  CO2 25 28 29   GLUCOSE 153* 124* 122*  BUN 19 21 17   CREATININE 0.94 0.95 0.89  CALCIUM 8.9 8.7* 8.4*   GFR: Estimated Creatinine Clearance: 42.1 mL/min (by C-G formula based on SCr of 0.89 mg/dL). Liver Function Tests: Recent Labs  Lab 07/23/18 1212  AST 33  ALT 31  ALKPHOS 58  BILITOT 0.8  PROT 6.3*  ALBUMIN 2.9*   No results for input(s): LIPASE, AMYLASE in the last 168 hours. No results for input(s): AMMONIA in the last 168 hours. Coagulation Profile: Recent Labs  Lab 07/23/18 1212  INR 1.12   Cardiac Enzymes: No results for input(s): CKTOTAL, CKMB, CKMBINDEX, TROPONINI in the last 168 hours. BNP (last 3 results) Recent Labs    05/12/18 1020  PROBNP 2,040*   HbA1C: No results for input(s): HGBA1C in the last 72 hours. CBG: No results for input(s): GLUCAP in the last 168 hours. Lipid Profile: No results for input(s): CHOL, HDL, LDLCALC, TRIG, CHOLHDL, LDLDIRECT in the last 72 hours. Thyroid Function Tests: Recent Labs    07/25/18 0326  TSH 8.611*   Anemia Panel: No results for input(s): VITAMINB12, FOLATE, FERRITIN, TIBC, IRON, RETICCTPCT in the last 72 hours. Urine analysis:    Component Value Date/Time   COLORURINE YELLOW (A) 07/23/2018 1516   APPEARANCEUR CLEAR (A) 07/23/2018 1516   LABSPEC 1.020 07/23/2018 1516   PHURINE 7.0 07/23/2018 1516   GLUCOSEU NEGATIVE 07/23/2018 1516   HGBUR NEGATIVE 07/23/2018 1516   BILIRUBINUR NEGATIVE 07/23/2018 1516   KETONESUR  NEGATIVE 07/23/2018 1516   PROTEINUR NEGATIVE 07/23/2018 1516   NITRITE NEGATIVE 07/23/2018 1516   LEUKOCYTESUR POSITIVE (A) 07/23/2018 1516   Sepsis Labs: @LABRCNTIP (procalcitonin:4,lacticidven:4) )No results found for this or any previous visit (from the past 240 hour(s)).       Radiology Studies: Ct Chest Wo Contrast  Result Date: 07/26/2018 CLINICAL DATA:  Acute respiratory illness, history atrial fibrillation, hypertension, former smoker, COPD EXAM: CT CHEST WITHOUT CONTRAST TECHNIQUE: Multidetector CT imaging of the chest was performed following the standard protocol without IV contrast. Sagittal and coronal MPR images reconstructed from axial data set. COMPARISON:  05/17/2018 CT a chest, chest radiograph 07/25/2018 FINDINGS: Cardiovascular: Atherosclerotic calcifications aorta, proximal great vessels and coronary arteries. Aorta normal caliber. Minimal enlargement of cardiac chambers. Mitral annular calcification. No pericardial effusion. Mediastinum/Nodes: Esophagus unremarkable.  Scattered normal sized mediastinal lymph nodes. Single minimally enlarged RIGHT paratracheal node 11 mm short axis image 43. Base of cervical region unremarkable. Lungs/Pleura: Diffuse mixed interstitial and ground-glass alveolar infiltrates which may represent edema or atypical infection. Overall pattern unchanged versus previous exam. No pleural effusion, pneumothorax, or discrete pulmonary mass/nodule. Upper Abdomen: Mildly hyperdense liver. Remaining visualized upper abdomen unremarkable. Musculoskeletal: Osseous demineralization with prior spinal augmentation procedures at T8 and T9 as well as a a new compression fracture of T4 vertebral body with anterior and central height loss. IMPRESSION: Extensive atherosclerotic disease including coronary arteries. Mixed interstitial and ground-glass of the liver infiltrates similar to that seen on 05/17/2018, question pulmonary edema or atypical infection. New  compression fracture of T4 vertebral body since 05/17/2018. Hyperdense liver, can be seen with hemosiderosis/iron overload, Wilson's disease, glycogen storage disease, and amiodarone therapy; review of patient's electronic health record indicates patient takes amiodarone. Aortic Atherosclerosis (ICD10-I70.0). Electronically Signed   By: Lavonia Dana M.D.   On: 07/26/2018 18:13   Dg Chest Port 1 View  Result Date: 07/25/2018 CLINICAL DATA:  Shortness of breath. Acute right acetabular fracture. EXAM: PORTABLE CHEST 1 VIEW COMPARISON:  07/23/2018 FINDINGS: Moderate enlargement of the cardiopericardial silhouette with bilateral diffuse interstitial accentuation. Faint Kerley B lines. Atherosclerotic calcification of the aortic arch. The airspace opacities on the prior exam have mildly improved. Tortuous thoracic aorta. Two midthoracic vertebral levels demonstrate vertebral augmentation. Bony demineralization. IMPRESSION: 1. Interstitial pulmonary edema is present, although improved compared to 07/23/2018. 2. Moderate cardiomegaly. 3.  Aortic Atherosclerosis (ICD10-I70.0). Electronically Signed   By: Van Clines M.D.   On: 07/25/2018 19:53      Scheduled Meds: . amiodarone  200 mg Oral Daily  . apixaban  2.5 mg Oral BID  . escitalopram  20 mg Oral QHS  . furosemide  40 mg Intravenous Q8H  . iron polysaccharides  150 mg Oral Daily  . levothyroxine  75 mcg Oral QAC breakfast  . methylPREDNISolone (SOLU-MEDROL) injection  60 mg Intravenous Once  . methylPREDNISolone (SOLU-MEDROL) injection  60 mg Intravenous Q6H  . metoprolol tartrate  12.5 mg Oral BID  . pantoprazole  40 mg Oral Daily  . potassium chloride SA  20 mEq Oral Daily   Continuous Infusions:   LOS: 4 days    Time spent in minutes: 35    Debbe Odea, MD [facility] Pager: [email or web address] Password [email or web address] 07/27/2018, 1:21 PM

## 2018-07-27 NOTE — Progress Notes (Signed)
Patient is resting comfortably on HFNC 25L/80%. Patient states she does not feel she needs to wear BIPAP at this time. RT instructed patient to call if he she has difficulty breathing. RT will continue to monitor.

## 2018-07-27 NOTE — Consult Note (Signed)
NAME:  Brooke Mullins, MRN:  [id number], DOB:  11-Dec-1933, LOS: 4 ADMISSION DATE:  07/23/2018, CONSULTATION DATE:  07/27/18 REFERRING MD:  Wynelle Cleveland  CHIEF COMPLAINT:  SOB   Brief History   Brooke Mullins is a 84 y.o. female who was admitted 12/22 after fall.  Found to have right hip fx's that are being managed conservatively. PCCM called 12/26 due to high FiO2 needs due to possible underlying IPF (see discussion in HPI regarding IPF diagnosis).  History of present illness   Brooke Mullins is a 84 y.o. female who has a PMH as outlined below including but not limited to recent diagnosis of IPF in  for which she is on 3L O2 chronically (no documentation available.  Per family report, she had "biopsies" taken; however, unclear if this was bronch with genomics vs bronch with only histopatholgy).   She presented to [facility] 12/22 after sustaining a fall at home that was witnessed by pt's husband.  She had no LOC but was unable to recall the event.  She was found to have right inferior rami and acetabular fx's.  Ortho was consulted who recommended non-surgical intervention with pain control for now along with follow up with pt's orthopedic surgeon in [place].  Since admission, she has had increasing O2 requirements to the point that she required BiPAP with 100% FiO2.  PCCM was consulted AM 12/26 for further evaluation and management.  At the time of my evaluation, I was able to lower her FiO2 to 50% and she maintained SpO2 anywhere from 91 - 96%.  She tells me that in November 2019, she saw Dr. Bernette Mayers in [place] who "used a camera to go down and take pieces from my lung".  She was told that she had IPF and was started on 20mg  prednisone daily.  She uses 3L O2 chronically at home. She denies any recent fevers/chills/sweats.  Currently denies dyspnea and does feel that BiPAP has helped.  Past Medical History  IPF on 3L O2, A.fib, subclavian artery stenosis, HTN, HLD, CAD, PSVT, PVD, depression,  hypothyroidism.  Significant Hospital Events   12/22 > admit. 12/26 > PCCM consult.  Consults:  Orthopedics. PCCM.  Procedures:  None.  Significant Diagnostic Tests:  CT chest 12/25 > Diffuse GGO's with bronchiectasis.  New compression fx of T4 vertebral body.  Micro Data:  Sputum 12/26 >   Antimicrobials:  None.   Interim history/subjective:  Comfortable.  On BiPAP, FiO2 dropped from 90% to 50% and SpO2 remains 91 - 96%.  Objective:  Blood pressure 118/61, pulse 90, temperature (!) 97 F (36.1 C), temperature source Axillary, resp. rate (!) 34, height 5\' 4"  (1.626 m), weight 61.2 kg, SpO2 (!) 86 %.    FiO2 (%):  [90 %] 90 %   Intake/Output Summary (Last 24 hours) at 07/27/2018 1136 Last data filed at 07/26/2018 1330 Gross per 24 hour  Intake 240 ml  Output -  Net 240 ml   Filed Weights   07/23/18 1219 07/27/18 0554  Weight: 56.7 kg 61.2 kg    Examination: General: Elderly female, frail, in NAD. Neuro: A&O x 3, no deficits. HEENT: [place]/AT. Sclerae anicteric.  BiPAP in place. Cardiovascular: RRR, no M/R/G.  Lungs: Respirations even and unlabored.  Very faint crackles. Abdomen: BS x 4, soft, NT/ND.  Musculoskeletal: No gross deformities, no edema.  Skin: Intact, warm, no rashes.  Assessment & Plan:   Acute on chronic hypoxic respiratory failure with possible underlying IPF (per pt and family, she  was recently diagnosed with IPF in November 2019 by Dr. Bernette Mayers in [place] and they state she had biopsies taken and was started on 20mg  prednisone daily.  It is unclear whether these biopsies were bronch with genomics vs histopathology alone.  Unfortunately, Dr. Duke Salvia office is closed for the holidays until Dec 27th; therefore, I was unable to call and get information regarding what exactly was done, etc.  If she truly does have IPF and is currently in a IPF flare, she has extremely poor prognosis especially in light of her pubic rami and acetabular fx. - Continue  supplemental O2 as needed to maintain SpO2 > 88% (would not aim for goal of 95-100%). - Continue BiPAP PRN.  Will attempt to give her a break and switch to heated HFNC this afternoon. - Continue steroids, changed from baseline 20mg  pred to 60mg  solumedrol q6hrs. - Continue low dose morphine PRN. - Bronchial hygiene. - Assess sputum culture. - Follow CXR intermittently. - Will need to get records from Dr. Duke Salvia office in [place] (closed until after 07/28/18).  Rest per primary team.   Montey Hora, [place] Pager: [phone]  or [phone] 07/27/2018, 11:54 AM   Best Practice:  Diet: Regular Pain/Anxiety/Delirium protocol (if indicated): N/A. VAP protocol (if indicated): N/A. DVT prophylaxis: SCD's / apixaban. GI prophylaxis: Pantoprazole. Glucose control: N/A. Mobility: Bedrest. Code Status: DNR. Family Communication: Son updated at bedside. Disposition: Progressive Care.  Labs   CBC: Recent Labs  Lab 07/23/18 1212 07/24/18 0228 07/27/18 0148  WBC 13.0* 8.7 11.3*  NEUTROABS 9.8*  --   --   HGB 9.9* 9.0* 9.0*  HCT 33.5* 28.2* 29.4*  MCV 77.7* 75.2* 74.8*  PLT 284 260 427   Basic Metabolic Panel: Recent Labs  Lab 07/23/18 1212 07/24/18 0228 07/27/18 0148  NA 138 138 131*  K 3.6 4.3 4.1  CL 99 95* 92*  CO2 25 28 29   GLUCOSE 153* 124* 122*  BUN 19 21 17   CREATININE 0.94 0.95 0.89  CALCIUM 8.9 8.7* 8.4*   GFR: Estimated Creatinine Clearance: 42.1 mL/min (by C-G formula based on SCr of 0.89 mg/dL). Recent Labs  Lab 07/23/18 1212 07/24/18 0228 07/26/18 1653 07/27/18 0148  PROCALCITON  --   --  0.46 0.47  WBC 13.0* 8.7  --  11.3*   Liver Function Tests: Recent Labs  Lab 07/23/18 1212  AST 33  ALT 31  ALKPHOS 58  BILITOT 0.8  PROT 6.3*  ALBUMIN 2.9*   No results for input(s): LIPASE, AMYLASE in the last 168 hours. No results for input(s): AMMONIA in the last 168 hours. ABG    Component  Value Date/Time   PHART 7.501 (H) 07/23/2018 1336   PCO2ART 35.5 07/23/2018 1336   PO2ART 51.0 (L) 07/23/2018 1336   HCO3 27.8 07/23/2018 1336   TCO2 29 07/23/2018 1336   O2SAT 89.0 07/23/2018 1336    Coagulation Profile: Recent Labs  Lab 07/23/18 1212  INR 1.12   Cardiac Enzymes: No results for input(s): CKTOTAL, CKMB, CKMBINDEX, TROPONINI in the last 168 hours. HbA1C: No results found for: HGBA1C CBG: No results for input(s): GLUCAP in the last 168 hours.  Review of Systems:   All negative; except for those that are bolded, which indicate positives.  Constitutional: weight loss, weight gain, night sweats, fevers, chills, fatigue, weakness.  HEENT: headaches, sore throat, sneezing, nasal congestion, post nasal drip, difficulty swallowing, tooth/dental problems, visual complaints, visual changes,  ear aches. Neuro: difficulty with speech, weakness, numbness, ataxia. CV:  chest pain, orthopnea, PND, swelling in lower extremities, dizziness, palpitations, syncope.  Resp: cough, hemoptysis, dyspnea, wheezing. GI: heartburn, indigestion, abdominal pain, nausea, vomiting, diarrhea, constipation, change in bowel habits, loss of appetite, hematemesis, melena, hematochezia.  GU: dysuria, change in color of urine, urgency or frequency, flank pain, hematuria. MSK: joint pain or swelling, decreased range of motion, back pain, right hip pain. Psych: change in mood or affect, depression, anxiety, suicidal ideations, homicidal ideations. Skin: rash, itching, bruising.   Past medical history  She,  has a past medical history of Atrial fibrillation [facility]), Carotid artery stenosis with cerebral infarction [facility]), Carotid atherosclerosis, bilateral, Chronic bronchitis ([place]), Depression, Dyslipidemia, Edema, History of blood transfusion (04/24/2018), Hyperplastic colon polyp, Hypertension, Hypothyroidism, Osteoporosis, Peripheral vascular disease of extremity ([place]), PSVT (paroxysmal supraventricular  tachycardia) ([place]), Skin cancer, and Subclavian artery stenosis ([place]).   Surgical History    Past Surgical History:  Procedure Laterality Date  . ABDOMINAL HYSTERECTOMY    . APPENDECTOMY    . CARDIOVERSION N/A 10/17/2017   Procedure: CARDIOVERSION;  Surgeon: Sanda Klein, MD;  Location: [facility] ENDOSCOPY;  Service: Cardiovascular;  Laterality: N/A;  . CARDIOVERSION N/A 01/02/2018   Procedure: CARDIOVERSION;  Surgeon: Larey Dresser, MD;  Location: [facility] ENDOSCOPY;  Service: Cardiovascular;  Laterality: N/A;  . CATARACT EXTRACTION W/ INTRAOCULAR LENS  IMPLANT, BILATERAL Bilateral   . COLONOSCOPY W/ BIOPSIES AND POLYPECTOMY    . FRACTURE SURGERY    . HIP FRACTURE SURGERY Left 09/2006  . ORIF ORBITAL FRACTURE  01/07/2018   ORBITOTOMY ANTERIOR WITH DRAINAGE OF ORBITAL HEMATOMA, ORIF ORBITAL FRACTURE,ORBITAL DECOMPRESSION/care everywhere notes     Social History   reports that she has quit smoking. Her smoking use included cigarettes. She has a 30.00 pack-year smoking history. She has never used smokeless tobacco. She reports that she does not drink alcohol or use drugs.   Family history   Her family history includes COPD in her father; Cancer in her brother; Heart failure in her mother; Hypertension in her brother.   Allergies Allergies  Allergen Reactions  . Penicillins Anaphylaxis and Other (See Comments)    Has patient had a PCN reaction causing immediate rash, facial/tongue/throat swelling, SOB or lightheadedness with hypotension: Yes Has patient had a PCN reaction causing severe rash involving mucus membranes or skin necrosis: No Has patient had a PCN reaction that required hospitalization: No Has patient had a PCN reaction occurring within the last 10 years: No If all of the above answers are "NO", then may proceed with Cephalosporin use.   . Atorvastatin Other (See Comments)    Muscle pain  . Codeine Other (See Comments)    Nausea/Vomiting  . Colesevelam Other (See Comments)     Unsure of exact reaction type  . Ezetimibe Other (See Comments)    Pain in legs     Home meds  Prior to Admission medications   Medication Sig Start Date End Date Taking? Authorizing Provider  apixaban (ELIQUIS) 2.5 MG TABS tablet Take 1 tablet (2.5 mg total) by mouth 2 (two) times daily. 04/28/18  Yes Kathyrn Drown D, NP  escitalopram (LEXAPRO) 20 MG tablet Take 20 mg by mouth at bedtime.   Yes [provider]  furosemide (LASIX) 40 MG tablet Take 40 mg by mouth daily.  08/23/15  Yes [provider]  iron polysaccharides (NIFEREX) 150 MG capsule Take 1 capsule (150 mg total) by mouth daily. 04/29/18  Yes Tommie Raymond, NP  levothyroxine (SYNTHROID, LEVOTHROID) 75 MCG tablet Take 75 mcg by mouth at bedtime.   Yes [provider]  metoprolol tartrate (LOPRESSOR) 25 MG tablet Take 12.5 mg by mouth 2 (two) times daily.   Yes [provider]  omeprazole (PRILOSEC) 20 MG capsule Take 20 mg by mouth at bedtime.  09/09/17  Yes [provider]  potassium chloride SA (K-DUR,KLOR-CON) 20 MEQ tablet Take 20 mEq by mouth daily.   Yes [provider]  predniSONE (DELTASONE) 20 MG tablet Take 20 mg by mouth daily with breakfast.   Yes [provider]  Propylene Glycol (SYSTANE BALANCE) 0.6 % SOLN Place 1 drop into both eyes as needed (dry eyes).   Yes [provider]  amiodarone (PACERONE) 200 MG tablet Take 2 tablets (400 mg total) twice daily for 2 weeks, then take 1 tablet (200 mg total) twice daily for 2 weeks, then take 1 tablet daily 07/24/18   Park Liter, MD     Montey Hora, [facility] Pager: [phone]  or [phone] 07/27/2018, 11:36 AM

## 2018-07-27 NOTE — Progress Notes (Addendum)
ANTICOAGULATION CONSULT NOTE - Initial Consult  Pharmacy Consult for IV heparin (Eliquis on hold) Indication: atrial fibrillation  Allergies  Allergen Reactions  . Penicillins Anaphylaxis and Other (See Comments)    Has patient had a PCN reaction causing immediate rash, facial/tongue/throat swelling, SOB or lightheadedness with hypotension: Yes Has patient had a PCN reaction causing severe rash involving mucus membranes or skin necrosis: No Has patient had a PCN reaction that required hospitalization: No Has patient had a PCN reaction occurring within the last 10 years: No If all of the above answers are "NO", then may proceed with Cephalosporin use.   . Atorvastatin Other (See Comments)    Muscle pain  . Codeine Other (See Comments)    Nausea/Vomiting  . Colesevelam Other (See Comments)    Unsure of exact reaction type  . Ezetimibe Other (See Comments)    Pain in legs    Patient Measurements: Height: 5\' 4"  (162.6 cm) Weight: 134 lb 14.7 oz (61.2 kg) IBW/kg (Calculated) : 54.7 Heparin Dosing Weight: 56.7 kg  Vital Signs: Temp: 98.4 F (36.9 C) (12/26 1158) Temp Source: Axillary (12/26 1158) BP: 136/50 (12/26 1249)  Labs: Recent Labs    07/27/18 0148 07/27/18 1326  HGB 9.0*  --   HCT 29.4*  --   PLT 238  --   CREATININE 0.89  --   CKTOTAL  --  14*  CKMB  --  1.1    Estimated Creatinine Clearance: 42.1 mL/min (by C-G formula based on SCr of 0.89 mg/dL).   Medical History: Past Medical History:  Diagnosis Date  . Atrial fibrillation ([place])   . Carotid artery stenosis with cerebral infarction ([place])   . Carotid atherosclerosis, bilateral   . Chronic bronchitis ([place])   . Depression   . Dyslipidemia   . Edema   . History of blood transfusion 04/24/2018   "low HgB" (04/25/2018)  . Hyperplastic colon polyp   . Hypertension   . Hypothyroidism   . Osteoporosis   . Peripheral vascular disease of extremity ([place])   . PSVT (paroxysmal supraventricular tachycardia)  ([place])   . Skin cancer    "scraped off my neck and legs" (04/25/2018)  . Subclavian artery stenosis ([facility])     Medications:  Infusions:    Assessment: 84 yo on chronic Eliquis for afib, currently held for [facility] next week.  Has not had Eliquis dose since 12/25 AM since on bipap and unable to take po.  Pharmacy asked to initiate IV heparin today.  No overt bleeding or complications noted.  Hgb low at baseline.  Checking baseline heparin level - likely elevated due to recent Eliquis use.  Goal of Therapy:  Heparin level 0.3-0.7 units/ml Monitor platelets by anticoagulation protocol: Yes   Plan:  1. Start IV heparin now at rate of 750 units/hr 2. Check aPTT in 8 hrs. 3. Daily aPTT, heparin level and CBC. 4. F/u plans for anticoagulation after cath next week.  Marguerite Olea, [employer] Clinical Pharmacist Phone [phone]  07/27/2018 4:07 PM

## 2018-07-27 NOTE — NC FL2 (Signed)
[place] LEVEL OF CARE SCREENING TOOL     IDENTIFICATION  Patient Name: Brooke Mullins Birthdate: 08/11/1933 Sex: female Admission Date (Current Location): 07/23/2018  [facility] and [place] Number:  [employer] and Address:  [facility] [place], [place] [address] [place], [place] [address]      Provider Number: [id number]  Attending Physician Name and Address:  Debbe Odea, MD  Relative Name and Phone Number:  Gwinda Passe (daughter) [phone]    Current Level of Care: Hospital Recommended Level of Care: [place] Prior Approval Number:    Date Approved/Denied:   PASRR Number: [id number]  Discharge Plan: SNF    Current Diagnoses: Patient Active Problem List   Diagnosis Date Noted  . Acute on chronic respiratory failure with hypoxia ([place]) 07/26/2018  . Intractable pain 07/23/2018  . Fall at home, initial encounter 07/23/2018  . COPD (chronic obstructive pulmonary disease) ([place]) 07/23/2018  . Chronic respiratory failure ([place]) 07/23/2018  . Hypothyroidism 07/23/2018  . Fall   . Closed fracture of right inferior pubic ramus ([place])   . Pulmonary fibrosis ([place])   . Pressure injury of skin 04/28/2018  . Chronic anticoagulation 04/28/2018  . Hypokalemia 04/28/2018  . Hyponatremia 04/28/2018  . Cough 04/26/2018  . Anemia 04/26/2018  . Diastolic congestive heart failure, NYHA class 3 ([place]) 04/25/2018  . Persistent atrial fibrillation 09/19/2017  . Peripheral vascular disease of extremity ([place])   . Dyslipidemia   . Carotid atherosclerosis, bilateral   . Subclavian artery stenosis ([place])   . PSVT (paroxysmal supraventricular tachycardia) ([place])   . Hypertension   . Hyperplastic colon polyp   . Fracture of femoral neck, right ([place])   . Carotid artery stenosis with cerebral infarction ([place])   . Depression   . Edema     Orientation RESPIRATION BLADDER Height & Weight     Self, Time, Situation, Place  Other  (Comment)(Bipap adult small mask, set rate:5, resp rate:24, IPAP 12, Epap 6, 02 %: 100, minute vent:11, leak:35, peak pressure:12, tidal volume:341) Incontinent, External catheter Weight: 61.2 kg Height:  5\' 4"  (162.6 cm)  BEHAVIORAL SYMPTOMS/MOOD NEUROLOGICAL BOWEL NUTRITION STATUS      Continent Diet(see discharge summary)  AMBULATORY STATUS COMMUNICATION OF NEEDS Skin   Extensive Assist Verbally Other (Comment)(ecchymosis right arm)                       Personal Care Assistance Level of Assistance  Bathing, Feeding, Total care Bathing Assistance: Maximum assistance Feeding assistance: Independent   Total Care Assistance: Maximum assistance   Functional Limitations Info  Sight, Hearing, Speech Sight Info: Adequate Hearing Info: Adequate Speech Info: Adequate    SPECIAL CARE FACTORS FREQUENCY  PT (By licensed PT), OT (By licensed OT)     PT Frequency: min 5x weekly OT Frequency: min 5x weekly            Contractures Contractures Info: Not present    Additional Factors Info  Code Status, Allergies Code Status Info: DNR Allergies Info: Penicillins, Atorvastatin, Codeine, Colesevelam, Ezetimibe           Current Medications (07/27/2018):  This is the current hospital active medication list Current Facility-Administered Medications  Medication Dose Route Frequency Provider Last Rate Last Dose  . acetaminophen (TYLENOL) tablet 650 mg  650 mg Oral Q6H PRN Quintella Baton, MD   650 mg at 07/25/18 1110   Or  . acetaminophen (TYLENOL) suppository 650 mg  650 mg Rectal  Q6H PRN Quintella Baton, MD      . albuterol (PROVENTIL) (2.5 MG/3ML) 0.083% nebulizer solution 2.5 mg  2.5 mg Nebulization Q2H PRN Kc, Ramesh, MD   2.5 mg at 07/27/18 0922  . amiodarone (PACERONE) tablet 200 mg  200 mg Oral Daily Crosley, Debby, MD   200 mg at 07/26/18 1048  . apixaban (ELIQUIS) tablet 2.5 mg  2.5 mg Oral BID Claria Dice, Debby, MD   2.5 mg at 07/26/18 1048  . escitalopram (LEXAPRO) tablet  20 mg  20 mg Oral QHS Crosley, Debby, MD   20 mg at 07/25/18 2234  . furosemide (LASIX) injection 40 mg  40 mg Intravenous Q8H Rizwan, Saima, MD      . HYDROcodone-acetaminophen (NORCO/VICODIN) 5-325 MG per tablet 1 tablet  1 tablet Oral Q6H PRN Antonieta Pert, MD   1 tablet at 07/26/18 1319  . iron polysaccharides (NIFEREX) capsule 150 mg  150 mg Oral Daily Crosley, Debby, MD   150 mg at 07/26/18 1048  . levothyroxine (SYNTHROID, LEVOTHROID) tablet 75 mcg  75 mcg Oral QAC breakfast Quintella Baton, MD   75 mcg at 07/25/18 0522  . methylPREDNISolone sodium succinate (SOLU-MEDROL) 125 mg/2 mL injection 60 mg  60 mg Intravenous Once Kc, Ramesh, MD      . metoprolol tartrate (LOPRESSOR) tablet 12.5 mg  12.5 mg Oral BID Crosley, Debby, MD   12.5 mg at 07/26/18 1048  . morphine 2 MG/ML injection 1 mg  1 mg Intravenous Q4H PRN Quintella Baton, MD   1 mg at 07/27/18 0415  . pantoprazole (PROTONIX) EC tablet 40 mg  40 mg Oral Daily Crosley, Debby, MD   40 mg at 07/26/18 1048  . polyethylene glycol (MIRALAX / GLYCOLAX) packet 17 g  17 g Oral Daily PRN Crosley, Debby, MD      . polyvinyl alcohol (LIQUIFILM TEARS) 1.4 % ophthalmic solution 1 drop  1 drop Both Eyes PRN Crosley, Debby, MD      . potassium chloride SA (K-DUR,KLOR-CON) CR tablet 20 mEq  20 mEq Oral Daily Crosley, Debby, MD   20 mEq at 07/26/18 1048  . predniSONE (DELTASONE) tablet 20 mg  20 mg Oral Q breakfast Quintella Baton, MD   20 mg at 07/26/18 0712     Discharge Medications: Please see discharge summary for a list of discharge medications.  Relevant Imaging Results:  Relevant Lab Results:   Additional Information SSN: [id number]  Alberteen Sam, LCSW

## 2018-07-27 NOTE — Plan of Care (Signed)
  Problem: Education: Goal: Knowledge of General Education information will improve Description Including pain rating scale, medication(s)/side effects and non-pharmacologic comfort measures Outcome: Progressing   Problem: Clinical Measurements: Goal: Ability to maintain clinical measurements within normal limits will improve Outcome: Progressing Goal: Will remain free from infection Outcome: Progressing Goal: Cardiovascular complication will be avoided Outcome: Progressing   Problem: Pain Managment: Goal: General experience of comfort will improve Outcome: Progressing   Problem: Safety: Goal: Ability to remain free from injury will improve Outcome: Progressing   Problem: Skin Integrity: Goal: Risk for impaired skin integrity will decrease Outcome: Progressing

## 2018-07-27 NOTE — Consult Note (Addendum)
Cardiology Consultation:   Patient ID: ELEANNA THEILEN MRN: [id number]; DOB: 1934/02/19  Admit date: 07/23/2018 Date of Consult: 07/27/2018  Primary Care Provider: [name], MD Primary Cardiologist: [name], MD  Primary Electrophysiologist:  Will Meredith Leeds, MD   Patient Profile:   TENLEE WOLLIN is a 84 y.o. female with a very complex past medical history that include persistent atrial fibrillation, AAD therapy w/ Amiodarone, evidence of left ventricle hypertrophy with some suspicion for infiltrative cardiomyopathy, chronic anticoagulation, history of nosebleeding with maxillary artery embolization, mechanical falls, carotid artery disease with 100% occluded right carotid, diabetes, hyperlipidemia, hypertension, chronic respiratory failure on home O2 and recent diagnosis of IPF, who is being seen today for worsening dyspnea and CHF, at the request of Dr. Wynelle Cleveland, Internal Medicine.   History of Present Illness:   Ms. Ciesla is a 84 y/o female with a very complex past medical history that include persistent atrial fibrillation, AAD therapy w/ Amiodarone, evidence of left ventricle hypertrophy with some suspicion for infiltrative cardiomyopathy, chronic anticoagulation, history of nosebleeding with maxillary artery embolization, mechanical falls, carotid artery disease with 100% occluded right carotid, diabetes, hyperlipidemia, hypertension, chronic respiratory failure on home O2 and recent diagnosis of IPF, who is being seen today for worsening dyspnea and CHF, at the request of Dr. Wynelle Cleveland, Internal Medicine.   Pt is followed by Dr. Agustin Cree in [place] as well as Dr. Curt Bears in EP clinic.  On 10/17/17, she underwent elective DCCV for persistent atrial fibrillation. It was initially successful but she had return to atrial fibrillation when she was seen by Dr. Agustin Cree for post procedural f/u on 11/14/17. He subsequently referred her to Dr. Curt Bears. Dr. Curt Bears recommended either  initiation of an antiarrhythmic, followed by repeat cardioversion or consideration of ablation. The patient decided on antiarrythmics and chose amiodarone over dofetilide. Several weeks after starting amiodarone, she presented back for repeat DCCV, performed on 01/02/18. She converted back to NSR after 1 shock at 200 J. At time of EP f/u in August, she was still in NSR. However unfortunately after her cardioversion, she had a fall and hit her head.  She had some bleeding behind her right eye which damaged her optic nerve and require surgery. Plan at that time was to continue on amiodarone and anticoagulation and f/u with EP in 6 months.   One month later after her EP appt, she was admitted in September for acute hypoxic respiratory failure, in the setting of a/c diastolic CHF. She was treated w/ IV Lasix. Echo showed severe concentric hypertrophy, LVEF of 65% to 70% with severe concentric LV wall thickening with speckled intraventricular septum suggestive of infiltrative cardiomyopathy such as amyloidosis. It was recommended to consider cardiac MRI for better assessment but this was never done. Based on EKGs, it appears she was in NSR during that admission.   Since her last hospitalization in September, pt reports she had pulmonary w/u in [place].  We do not have records of w/u currently, however based on pt's reports, it sounds like she had a transbronchial biopsy and was then told that she had pulmonary fibrosis and was started on 20 mg prednisone.  It appears that prior to summer/fall 2019 she did not have any underlying diagnosis of lung disease.  The patient's primary reason for presenting back to the hospital on 07/23/18 was due to a mechanical wall at home. This was witnessed by her husband and she had no LOC. She was found to have a right pelvic fracture. Ortho was  consulted and recommended non-surgical intervention with pain control for now along with follow up with pt's orthopedic surgeon in  [place].  Since admission, she has had increase in O2 requirements and was ultimately placed on BiPAP w/ 100% FiO2. Pulmonary/ CCM was consulted earlier today and they agree that she very well likely has ILD. They will contact her pulmonologist in [place] for recent bronchoscopy report. They also suggest that she may benefit from a RHC to differentiae between heart failure and pulmonary injury. Cardiology has been asked to evaluate and weigh in as well.   EKGs this admission show SR w/ PACs and LBBB (also seen on EKGs in March).   Past Medical History:  Diagnosis Date  . Atrial fibrillation ([place])   . Carotid artery stenosis with cerebral infarction ([place])   . Carotid atherosclerosis, bilateral   . Chronic bronchitis ([place])   . Depression   . Dyslipidemia   . Edema   . History of blood transfusion 04/24/2018   "low HgB" (04/25/2018)  . Hyperplastic colon polyp   . Hypertension   . Hypothyroidism   . Osteoporosis   . Peripheral vascular disease of extremity ([place])   . PSVT (paroxysmal supraventricular tachycardia) ([place])   . Skin cancer    "scraped off my neck and legs" (04/25/2018)  . Subclavian artery stenosis [facility])     Past Surgical History:  Procedure Laterality Date  . ABDOMINAL HYSTERECTOMY    . APPENDECTOMY    . CARDIOVERSION N/A 10/17/2017   Procedure: CARDIOVERSION;  Surgeon: Sanda Klein, MD;  Location: [facility] ENDOSCOPY;  Service: Cardiovascular;  Laterality: N/A;  . CARDIOVERSION N/A 01/02/2018   Procedure: CARDIOVERSION;  Surgeon: Larey Dresser, MD;  Location: [facility] ENDOSCOPY;  Service: Cardiovascular;  Laterality: N/A;  . CATARACT EXTRACTION W/ INTRAOCULAR LENS  IMPLANT, BILATERAL Bilateral   . COLONOSCOPY W/ BIOPSIES AND POLYPECTOMY    . FRACTURE SURGERY    . HIP FRACTURE SURGERY Left 09/2006  . ORIF ORBITAL FRACTURE  01/07/2018   ORBITOTOMY ANTERIOR WITH DRAINAGE OF ORBITAL HEMATOMA, ORIF ORBITAL FRACTURE,ORBITAL DECOMPRESSION/care everywhere notes     Home  Medications:  Prior to Admission medications   Medication Sig Start Date End Date Taking? Authorizing Provider  apixaban (ELIQUIS) 2.5 MG TABS tablet Take 1 tablet (2.5 mg total) by mouth 2 (two) times daily. 04/28/18  Yes Kathyrn Drown D, NP  escitalopram (LEXAPRO) 20 MG tablet Take 20 mg by mouth at bedtime.   Yes [provider]  furosemide (LASIX) 40 MG tablet Take 40 mg by mouth daily.  08/23/15  Yes [provider]  iron polysaccharides (NIFEREX) 150 MG capsule Take 1 capsule (150 mg total) by mouth daily. 04/29/18  Yes Kathyrn Drown D, NP  levothyroxine (SYNTHROID, LEVOTHROID) 75 MCG tablet Take 75 mcg by mouth at bedtime.   Yes [provider]  metoprolol tartrate (LOPRESSOR) 25 MG tablet Take 12.5 mg by mouth 2 (two) times daily.   Yes [provider]  omeprazole (PRILOSEC) 20 MG capsule Take 20 mg by mouth at bedtime.  09/09/17  Yes [provider]  potassium chloride SA (K-DUR,KLOR-CON) 20 MEQ tablet Take 20 mEq by mouth daily.   Yes [provider]  predniSONE (DELTASONE) 20 MG tablet Take 20 mg by mouth daily with breakfast.   Yes [provider]  Propylene Glycol (SYSTANE BALANCE) 0.6 % SOLN Place 1 drop into both eyes as needed (dry eyes).   Yes [provider]  amiodarone (PACERONE) 200 MG tablet Take 2 tablets (  400 mg total) twice daily for 2 weeks, then take 1 tablet (200 mg total) twice daily for 2 weeks, then take 1 tablet daily 07/24/18   Park Liter, MD   Inpatient Medications: Scheduled Meds: . amiodarone  200 mg Oral Daily  . apixaban  2.5 mg Oral BID  . escitalopram  20 mg Oral QHS  . furosemide  40 mg Intravenous Q8H  . iron polysaccharides  150 mg Oral Daily  . levothyroxine  75 mcg Oral QAC breakfast  . methylPREDNISolone (SOLU-MEDROL) injection  60 mg Intravenous Once  . methylPREDNISolone (SOLU-MEDROL) injection  60 mg Intravenous Q6H  . metoprolol tartrate  12.5 mg Oral BID  .  pantoprazole  40 mg Oral Daily  . potassium chloride SA  20 mEq Oral Daily   Continuous Infusions:  PRN Meds: acetaminophen **OR** acetaminophen, albuterol, HYDROcodone-acetaminophen, morphine injection, polyethylene glycol, polyvinyl alcohol  Allergies:    Allergies  Allergen Reactions  . Penicillins Anaphylaxis and Other (See Comments)    Has patient had a PCN reaction causing immediate rash, facial/tongue/throat swelling, SOB or lightheadedness with hypotension: Yes Has patient had a PCN reaction causing severe rash involving mucus membranes or skin necrosis: No Has patient had a PCN reaction that required hospitalization: No Has patient had a PCN reaction occurring within the last 10 years: No If all of the above answers are "NO", then may proceed with Cephalosporin use.   . Atorvastatin Other (See Comments)    Muscle pain  . Codeine Other (See Comments)    Nausea/Vomiting  . Colesevelam Other (See Comments)    Unsure of exact reaction type  . Ezetimibe Other (See Comments)    Pain in legs    Social History:   Social History   Socioeconomic History  . Marital status: Married    Spouse name: Not on file  . Number of children: Not on file  . Years of education: Not on file  . Highest education level: Not on file  Occupational History  . Not on file  Social Needs  . Financial resource strain: Not on file  . Food insecurity:    Worry: Not on file    Inability: Not on file  . Transportation needs:    Medical: Not on file    Non-medical: Not on file  Tobacco Use  . Smoking status: Former Smoker    Packs/day: 1.00    Years: 30.00    Pack years: 30.00    Types: Cigarettes  . Smokeless tobacco: Never Used  . Tobacco comment: 04/25/2018 "quit smoking in the 1980s"  Substance and Sexual Activity  . Alcohol use: Never    Frequency: Never  . Drug use: Never  . Sexual activity: Not on file  Lifestyle  . Physical activity:    Days per week: Not on file    Minutes  per session: Not on file  . Stress: Not on file  Relationships  . Social connections:    Talks on phone: Not on file    Gets together: Not on file    Attends religious service: Not on file    Active member of club or organization: Not on file    Attends meetings of clubs or organizations: Not on file    Relationship status: Not on file  . Intimate partner violence:    Fear of current or ex partner: Not on file    Emotionally abused: Not on file    Physically abused: Not on file  Forced sexual activity: Not on file  Other Topics Concern  . Not on file  Social History Narrative  . Not on file    Family History:    Family History  Problem Relation Age of Onset  . Heart failure Mother   . COPD Father   . Hypertension Brother   . Cancer Brother      ROS:  Please see the history of present illness.   All other ROS reviewed and negative.     Physical Exam/Data:   Vitals:   07/27/18 0745 07/27/18 0924 07/27/18 1158 07/27/18 1249  BP:    (!) 136/50  Pulse:      Resp:      Temp: (!) 97 F (36.1 C)  98.4 F (36.9 C)   TempSrc: Axillary  Axillary   SpO2:  (!) 86%  90%  Weight:      Height:        Intake/Output Summary (Last 24 hours) at 07/27/2018 1304 Last data filed at 07/26/2018 1330 Gross per 24 hour  Intake 240 ml  Output -  Net 240 ml   Filed Weights   07/23/18 1219 07/27/18 0554  Weight: 56.7 kg 61.2 kg   Body mass index is 23.16 kg/m.  General: elderly WF, on O2, no distress HEENT: normal Lymph: no adenopathy Neck: no JVD Endocrine:  No thryomegaly Vascular: No carotid bruits; FA pulses 2+ bilaterally without bruits  Cardiac:  normal S1, S2; RRR; no murmur  Lungs:  Crackles at at right base    Abd: soft, nontender, no hepatomegaly  Ext: no edema Musculoskeletal:  No deformities, BUE and BLE strength normal and equal Skin: warm and dry  Neuro:  CNs 2-12 intact, no focal abnormalities noted Psych:  Normal affect   EKG:  The EKG was personally  reviewed and demonstrates:  NSR w/ PACs and LBBB Telemetry:  Telemetry was personally reviewed and demonstrates:  NSR   Relevant CV Studies: 2D Echo 04/26/18 Study Conclusions  - Left ventricle: The cavity size was normal. There was severe   concentric hypertrophy. Systolic function was vigorous. The   estimated ejection fraction was in the range of 65% to 70%. Wall   motion was normal; there were no regional wall motion   abnormalities. The study is not technically sufficient to allow   evaluation of LV diastolic function. - Aortic valve: Sclerosis without stenosis. There was trivial   regurgitation. - Mitral valve: Calcified annulus. Mildly thickened leaflets .   There was trivial regurgitation. - Left atrium: Moderately dilated. - Right ventricle: The cavity size was mildly dilated. Mildly   reduced systolic function. - Tricuspid valve: There was moderate regurgitation. - Pulmonary arteries: PA peak pressure: 59 mm Hg (S). - Inferior vena cava: The vessel was normal in size. The   respirophasic diameter changes were in the normal range (>= 50%),   consistent with normal central venous pressure.  Impressions:  - LVEF 65-70%, severe concentric LV wall thickening - speckled   intraventricular septum suggestive of infiltrative cardiomyopathy   such as amyloidosis. Consider PYP nuclear scan. Grossly normal   wall motion, aortic sclerosis with trivial AI, MAC with trivial   MR, moderate LAE, mildly dilated RV with reduced systolic   function, moderate TR, RVSP 59 mmHg, normal IVC.   Laboratory Data:  Chemistry Recent Labs  Lab 07/23/18 1212 07/24/18 0228 07/27/18 0148  NA 138 138 131*  K 3.6 4.3 4.1  CL 99 95* 92*  CO2 25 28 29  GLUCOSE 153* 124* 122*  BUN _0 CREATININE 0.94 0.95 0.89  CALCIUM 8.9 8.7* 8.4*  GFRNONAA 56* 56* >60  GFRAA >60 >60 >60  ANIONGAP _1 Recent Labs  Lab 07/23/18 1212  PROT 6.3*  ALBUMIN 2.9*  AST 33  ALT 31   ALKPHOS 58  BILITOT 0.8   Hematology Recent Labs  Lab 07/23/18 1212 07/24/18 0228 07/27/18 0148  WBC 13.0* 8.7 11.3*  RBC 4.31 3.75* 3.93  HGB 9.9* 9.0* 9.0*  HCT 33.5* 28.2* 29.4*  MCV 77.7* 75.2* 74.8*  MCH 23.0* 24.0* 22.9*  MCHC 29.6* 31.9 30.6  RDW 23.8* 23.7* 22.5*  PLT 284 260 238   Cardiac EnzymesNo results for input(s): TROPONINI in the last 168 hours.  Recent Labs  Lab 07/23/18 1219  TROPIPOC 0.03    BNP Recent Labs  Lab 07/26/18 1653  BNP 1,180.8*    DDimer No results for input(s): DDIMER in the last 168 hours.  Radiology/Studies:  Dg Chest 1 View  Result Date: 07/23/2018 CLINICAL DATA:  Fall, pelvic fracture EXAM: CHEST  1 VIEW COMPARISON:  06/16/2018 FINDINGS: Lower lung volumes which accentuates the diffuse reticulonodular interstitial opacities throughout both lungs compared to the prior study. Difficult to exclude superimposed edema. Heart is enlarged. No large effusion or pneumothorax. Aorta atherosclerotic. Previous thoracic vertebral augmentations. Degenerative changes of the spine. Bones are osteopenic. Monitor leads overlie the chest. IMPRESSION: Lower lung volumes which appear to accentuate the interstitial lung disease. Difficult to exclude superimposed edema Heart remains enlarged. Thoracic atherosclerosis Electronically Signed   By: Jerilynn Mages.  Shick M.D.   On: 07/23/2018 13:35   Ct Chest Wo Contrast  Result Date: 07/26/2018 CLINICAL DATA:  Acute respiratory illness, history atrial fibrillation, hypertension, former smoker, COPD EXAM: CT CHEST WITHOUT CONTRAST TECHNIQUE: Multidetector CT imaging of the chest was performed following the standard protocol without IV contrast. Sagittal and coronal MPR images reconstructed from axial data set. COMPARISON:  05/17/2018 CT a chest, chest radiograph 07/25/2018 FINDINGS: Cardiovascular: Atherosclerotic calcifications aorta, proximal great vessels and coronary arteries. Aorta normal caliber. Minimal enlargement of  cardiac chambers. Mitral annular calcification. No pericardial effusion. Mediastinum/Nodes: Esophagus unremarkable. Scattered normal sized mediastinal lymph nodes. Single minimally enlarged RIGHT paratracheal node 11 mm short axis image 43. Base of cervical region unremarkable. Lungs/Pleura: Diffuse mixed interstitial and ground-glass alveolar infiltrates which may represent edema or atypical infection. Overall pattern unchanged versus previous exam. No pleural effusion, pneumothorax, or discrete pulmonary mass/nodule. Upper Abdomen: Mildly hyperdense liver. Remaining visualized upper abdomen unremarkable. Musculoskeletal: Osseous demineralization with prior spinal augmentation procedures at T8 and T9 as well as a a new compression fracture of T4 vertebral body with anterior and central height loss. IMPRESSION: Extensive atherosclerotic disease including coronary arteries. Mixed interstitial and ground-glass of the liver infiltrates similar to that seen on 05/17/2018, question pulmonary edema or atypical infection. New compression fracture of T4 vertebral body since 05/17/2018. Hyperdense liver, can be seen with hemosiderosis/iron overload, Wilson's disease, glycogen storage disease, and amiodarone therapy; review of patient's electronic health record indicates patient takes amiodarone. Aortic Atherosclerosis (ICD10-I70.0). Electronically Signed   By: Lavonia Dana M.D.   On: 07/26/2018 18:13   Ct Hip Right Wo Contrast  Result Date: 07/23/2018 CLINICAL DATA:  Fall.  Right hip pain. EXAM: CT OF THE RIGHT HIP WITHOUT CONTRAST TECHNIQUE: Multidetector CT imaging of the right hip was performed according to the standard protocol. Multiplanar CT image reconstructions were also generated. COMPARISON:  X-rays from earlier today.  FINDINGS: Bones/Joint/Cartilage Status post right hip hemiarthroplasty. There is a comminuted fracture of the right acetabulum involving the medial wall and roof. Femoral component is located.  There is no acute fracture of the right inferior pubic ramus with approximately 8-10 mm of bony over riding. Ligaments Suboptimally assessed by CT. Muscles and Tendons No substantial hematoma within the right hip musculature. There is some hemorrhage in the right pelvic sidewall from the adjacent acetabular fracture. Soft tissues Bladder is markedly distended. IMPRESSION: Comminuted right acetabular fracture involving the medial wall and roof. The femoral component of the hemiarthroplasty remains located in the acetabulum. Associated acute fracture of the right inferior pubic ramus. Hemorrhage in the soft tissues of the right pelvic sidewall. Electronically Signed   By: Misty Stanley M.D.   On: 07/23/2018 16:30   Dg Chest Port 1 View  Result Date: 07/25/2018 CLINICAL DATA:  Shortness of breath. Acute right acetabular fracture. EXAM: PORTABLE CHEST 1 VIEW COMPARISON:  07/23/2018 FINDINGS: Moderate enlargement of the cardiopericardial silhouette with bilateral diffuse interstitial accentuation. Faint Kerley B lines. Atherosclerotic calcification of the aortic arch. The airspace opacities on the prior exam have mildly improved. Tortuous thoracic aorta. Two midthoracic vertebral levels demonstrate vertebral augmentation. Bony demineralization. IMPRESSION: 1. Interstitial pulmonary edema is present, although improved compared to 07/23/2018. 2. Moderate cardiomegaly. 3.  Aortic Atherosclerosis (ICD10-I70.0). Electronically Signed   By: Van Clines M.D.   On: 07/25/2018 19:53   Dg Hip Unilat W Or Wo Pelvis 2-3 Views Right  Result Date: 07/23/2018 CLINICAL DATA:  Fall, right hip injury, pain EXAM: DG HIP (WITH OR WITHOUT PELVIS) 2-3V RIGHT COMPARISON:  09/23/2013 FINDINGS: Bones are osteopenic. Degenerative changes of the lower lumbar spine, SI joints and left hip. Remote right hip arthroplasty. Cortical irregularity of the right acetabulum. Difficult to exclude nondisplaced right acetabular fracture. Also  there is oblique lucency through the right inferior ramus compatible with a right inferior ramus fracture. No diastasis. Remainder of the pelvis intact. No subluxation dislocation. Peripheral atherosclerosis noted. IMPRESSION: Remote right hip arthroplasty Right inferior ramus fracture, suspect acute Cortical irregularity of the right acetabulum medially, difficult to exclude nondisplaced acetabular fracture in the setting of osteopenia. Electronically Signed   By: Jerilynn Mages.  Shick M.D.   On: 07/23/2018 13:32    Assessment and Plan:   ZORINA MALLIN is a 84 y.o. female with a very complex past medical history that include persistent atrial fibrillation, AAD therapy w/ Amiodarone, evidence of left ventricle hypertrophy with some suspicion for infiltrative cardiomyopathy, chronic anticoagulation, history of nosebleeding with maxillary artery embolization, mechanical falls, carotid artery disease with 100% occluded right carotid, diabetes, hyperlipidemia, hypertension, chronic respiratory failure on home O2 and recent diagnosis of IPF, who is being seen today for worsening dyspnea and CHF, at the request of Dr. Wynelle Cleveland, Internal Medicine.   1. Acute on Chronic Hypoxic Respiratory Failure: per pt report, she has had recent pulmonary w/u at outside facility, with what sounds to be a transbronchial biopsy and was then told that she had pulmonary fibrosis and was started on 20 mg prednisone. She has also recently been on home supplemental O2 at baseline.  It appears that prior to summer/fall 2019 she did not have any underlying diagnosis of lung disease. She was admitted on 12/22 for hip fracture and since admission, she has had increase in O2 requirements and was ultimately placed on BiPAP w/ 100% FiO2. She has been seen and examined by PCCM and they agree that she likely has ILD. They have  recommended considering RHC to differentiae between heart failure and pulmonary injury. Agree that a RHC would be beneficial in this  situation. We can arrange this. Also the development of her pulmonary issues seem to coincide with the timing of initiation of amiodarone for atrial fibrillation. She is maintaining NSR. We should discontinue amiodarone. Will discuss w/ MD. Also ? If diastolic HF is contributing to her symptoms as well. There were concerns for possible infiltrative cardiomyopathy based on recent echocardiogram and it was advised to consider a cardiac MRI to further investigate. Dr. Meda Coffee to see later today and will determine if this should be pursued during this admission. Lastly, she may have underlying CAD contributing to symptoms as well. She had a chest CT yesterday that demonstrated extensive atherosclerotic disease including the coronary arteries. If we decide to pursue RHC, we can also do a LHC to better define her coronary anatomy and check for obstructive disease.   2. Acute on Chronic Diastolic CHF: echo in September showed LVEF 65-70% and severe concentric LV wall thickening - speckled intraventricular septum suggestive of infiltrative cardiomyopathy such as amyloidosis. ? Cardiac MRI this admission. May also need RHC. Admit BMP was 1,180. Continue IV Lasix, 40 mg QH8. Monitor renal function, electrolytes and BP.  If we do RHC, measurements will help to guide further diuresis.   3. Atrial Fibrillation: required multiple DCCV in March and June. Last cardioversion successful. She has been maintaining NSR w/ amiodarone but with new diagnosis of ILD. ? Amiodarone toxicity. We may need to discontinue amiodarone. Based on EP notes, before starting amiodarone, pt was given option to also chose dofetilide. If we stop amiodarone and if she has recurrence, then we may later consider dofetilide. She remains on Apixaban for a/c.   4. Chronic Anticoagulation: currently on Apixaban for atrial fibrillation. CHA2DS2 VASc score is 6 (CHF, HTN, DM, Age >71, female sex and vascular dz). This patients CHA2DS2-VASc Score and unadjusted  Ischemic Stroke Rate (% per year) is equal to 9.7 % stroke rate/year from a score of 6. Unfortunately she is high fall risk. She had a mechanical fall 12/22 resulting in right hip fracture. Also per records, she had a fall this past summer, hitting her head resulting in bleeding behind her right eye which damaged her optic nerve and require surgery. We should reconsider risk vs benefit. We can leave this up to the patient and her primary cardiologist Dr. Agustin Cree at her next f/u.   5. Rt Hip Fracture: 2/2 mechanical fall. Ortho has recommended non-surgical intervention with pain control for now along with follow up with pt's orthopedic surgeon in [place].  6. HTN: controlled on current regimen. Monitor while diuresing.   7. ILD: management per IM. Will likely need to stop amiodarone.   For questions or updates, please contact [place] Please consult [email or web address] for contact info under   Signed, Lyda Jester, PA-C  07/27/2018 1:04 PM  The patient was seen, examined and discussed with Brittainy M. Rosita Fire, PA-C and I agree with the above.   84 y.o. female with a complex past medical history that include persistent atrial fibrillation, AAD therapy w/ Amiodarone, evidence of left ventricle hypertrophy with some suspicion for infiltrative cardiomyopathy, chronic anticoagulation, h/o mechanical falls, carotid artery disease with 100% occluded right carotid, diabetes, hyperlipidemia, hypertension, chronic respiratory failure on home O2 and recent diagnosis of IPF, who is being seen today for worsening dyspnea and CHF. The patient is being followed by Dr. Curt Bears for atrial fibrillation, and back in  May was given an option between dofetilide and amiodarone and chose amiodarone.  She was cardioverted twice.  She was admitted in November of this year with acute on chronic hypoxic respiratory failure and was sent home on home oxygen.  The patient was fairly active and states that she was doing  well and not using her on home oxygen all the time until Saturday when her shortness of breath significantly got worse.  She then fell at home resulting in a right hip fracture.  On admission she was found to be in acute on chronic hypoxic respiratory failure and found to have severe interstitial lung disease as well as hypothyroidism.  On physical exam she seems to be gasping for breath currently on nonrebreather however she states that she is comfortable, she does not have elevated JVDs and has minimal crackles at both lung bases.  Her lower extremity edema have minimal edema they are warm and they have good peripheral pulses.  I have personally reviewed her echocardiogram that shows good LVEF 60 to 65% with severe concentric LVH with possible speckle pattern, we are unable to determine diastolic function given atrial fibrillation, her left atrium is at least moderately dilated, aortic valve is thickened with mild aortic regurgitation and no stenosis, there is severe mitral annular calcifications and thickened mitral valve with mild mitral regurgitation, there is mild pulmonary hypertension.  Her chest CT shows significant interstitial lung disease and minimal vascular congestion. It also shows severe coronary calcifications and severe diffuse aortic calcifications.  EKG shows atrial fibrillation with left bundle branch block.  Assessment/Plan:  Acute on chronic hypoxic respiratory failure with significant underlying interstitial lung disease possibly secondary to amiodarone toxicity Significant LVH with acute on chronic diastolic CHF Chronic atrial fibrillation on chronic anticoagulation Significant coronary calcifications  Plan:  Discontinue amiodarone Start Lasix 40 mg IV twice daily x 2 doses reevaluate in the morning Obtain technetium pyrophosphate nuclear scan to evaluate for TTR amyloidosis Hold Eliquis and plan for right and left sided cardiac catheterization on Monday.  Left-sided  cardiac catheterization with help [place] in navigational further atrial fibrillation therapy (possible dofetilide) Start heparin drip  Ena Dawley, MD 07/27/2018

## 2018-07-27 NOTE — Progress Notes (Signed)
Initial Nutrition Assessment  DOCUMENTATION CODES:   Not applicable  INTERVENTION:   -Ensure Enlive po BID, each supplement provides 350 kcal and 20 grams of protein -MVI with minerals daily  NUTRITION DIAGNOSIS:   Increased nutrient needs related to chronic illness(COPD) as evidenced by estimated needs.  GOAL:   Patient will meet greater than or equal to 90% of their needs  MONITOR:   PO intake, Supplement acceptance, Labs, Weight trends, Skin, I & O's  REASON FOR ASSESSMENT:   Consult Assessment of nutrition requirement/status  ASSESSMENT:   84y/o with history of COPD and chronic respiratory failure on 3 L nasal cannula at home.  She presents with complaints of falling after breakfast. She stood up from table and went down. It was a witnessed fall by husband. She did not hit her head, there is no report of LOC. The patient does not remember any of the event. She uses a cane, walker and wheelchair but uses the cane the most. She does not commonly fall.   Pt admitted with rt inferior rami fracture s/p fall.   Per orthopedics notes, plan non-operative treatment.   Case discussed with RN, who reports pt was recently taken off Bi-pap and has been eating well.   Spoke with pt, son-in-law, and daughter at bedside. Per pt daughter, pt has had a great appetite over the past 3 months (typical intake Breakfast: Ensure supplement, Lunch and Dinner: meat, starch, and vegetable). She has not eaten much during hospitalization related to Bi-pap but is excited to eat dinner.  Pt reports she experienced a fall, which resulted in facial discomfort in June, which resulted in decreased intake and weight loss. Pt no longer experiences difficulty with chewing or swallowing foods. Per daughter, pt lost about 35# within this time period, but has regained most of her lost weight. Son-in-law estimates that pt has lost about 3-4 pounds over the past week. Per wt hx, pt has experienced a 12.9% wt loss  over the past 9 months, which while not significant for time frame is concerning for advanced age and multiple comorbidities.   Discussed with pt importance of good meal and supplement intake to promote healing. Pt amenable to Ensure supplements. Also assisted pt and daughter with placing dinner order.   Medications reviewed and include prednisone.   Labs reviewed: Na: 131.   NUTRITION - FOCUSED PHYSICAL EXAM:    Most Recent Value  Orbital Region  Mild depletion  Upper Arm Region  Mild depletion  Thoracic and Lumbar Region  No depletion  Buccal Region  No depletion  Temple Region  No depletion  Clavicle Bone Region  Mild depletion  Clavicle and Acromion Bone Region  No depletion  Scapular Bone Region  No depletion  Dorsal Hand  Mild depletion  Patellar Region  No depletion  Anterior Thigh Region  No depletion  Posterior Calf Region  No depletion  Edema (RD Assessment)  Mild  Hair  Reviewed  Eyes  Reviewed  Mouth  Reviewed  Skin  Reviewed  Nails  Reviewed       Diet Order:   Diet Order            Diet NPO time specified  Diet effective midnight        Diet Heart Room service appropriate? Yes; Fluid consistency: Thin  Diet effective now              EDUCATION NEEDS:   Education needs have been addressed  Skin:  Skin Assessment: Skin  Integrity Issues: Skin Integrity Issues:: Stage I Stage I: rt foot  Last BM:  07/22/18  Height:   Ht Readings from Last 1 Encounters:  07/23/18 5\' 4"  (1.626 m)    Weight:   Wt Readings from Last 1 Encounters:  07/27/18 61.2 kg    Ideal Body Weight:  54.5 kg  BMI:  Body mass index is 23.16 kg/m.  Estimated Nutritional Needs:   Kcal:  1650-1850  Protein:  80-95 grams  Fluid:  1.6-1.8 L    Zalyn Amend A. Jimmye Norman, RD, LDN, CDE Pager: [phone] After hours Pager: [phone]

## 2018-07-27 NOTE — Progress Notes (Signed)
Patient taken off BiPAP at this time for trial. Placed on 15L HFNC. SAT 89% at this time. Will monitor as needed

## 2018-07-27 NOTE — Clinical Social Work Note (Signed)
Clinical Social Work Assessment  Patient Details  Name: Brooke Mullins MRN: [id number] Date of Birth: [birth year]  Date of referral:  07/27/18               Reason for consult:  Discharge Planning                Permission sought to share information with:  Case Manager, Facility Sport and exercise psychologist, Family Supports Permission granted to share information::  Yes, Verbal Permission Granted  Name::     Film/video editor::  SNFs  Relationship::  daughter  Contact Information:  [phone]  Housing/Transportation Living arrangements for the past 2 months:  [place] of Information:  Patient Patient Interpreter Needed:  None Criminal Activity/Legal Involvement Pertinent to Current Situation/Hospitalization:  No - Comment as needed Significant Relationships:  Adult Children Lives with:  Self Do you feel safe going back to the place where you live?  No Need for family participation in patient care:  Yes (Comment)  Care giving concerns:  CSW received referral for possible SNF placement at time of discharge. Spoke with patient's family at bedside regarding possibility of SNF placement . Patient's family   is currently unable to care for her at their home given patient's current needs and fall risk.  Patient and  [name] at bedside expressed understanding of PT recommendation and are agreeable to SNF placement at time of discharge. CSW to continue to follow and assist with discharge planning needs.     Social Worker assessment / plan:  Spoke with patient and [name] at bedside concerning possibility of rehab at [facility] before returning home.    Employment status:  Retired Nurse, adult PT Recommendations:  [place] / Referral to community resources:  [place]  Patient/Family's Response to care:  Patient and  [place]   recognize need for rehab before returning home and are agreeable to a SNF in  [place] area. They report no known preferences at this time. CSW provided list from Medicare.gov for SNFs in that area, copy placed in chart. CSW explained insurance authorization process. Patient's family reported that they want patient to get stronger to be able to come back home.    Patient/Family's Understanding of and Emotional Response to Diagnosis, Current Treatment, and Prognosis:  Patient/family is realistic regarding therapy needs and expressed being hopeful for SNF placement. Patient expressed understanding of CSW role and discharge process as well as medical condition. No questions/concerns about plan or treatment.    Emotional Assessment Appearance:  Appears stated age Attitude/Demeanor/Rapport:  Unable to Assess Affect (typically observed):  Unable to Assess Orientation:  Oriented to Self, Oriented to  Time, Oriented to Situation, Oriented to Place Alcohol / Substance use:  Not Applicable Psych involvement (Current and /or in the community):  No (Comment)  Discharge Needs  Concerns to be addressed:  Discharge Planning Concerns Readmission within the last 30 days:  No Current discharge risk:  Dependent with Mobility Barriers to Discharge:  Continued Medical Work up   [employer], LCSW 07/27/2018, 10:42 AM

## 2018-07-28 ENCOUNTER — Inpatient Hospital Stay (HOSPITAL_COMMUNITY): Payer: Medicare Other

## 2018-07-28 DIAGNOSIS — J9621 Acute and chronic respiratory failure with hypoxia: Secondary | ICD-10-CM

## 2018-07-28 DIAGNOSIS — T462X1A Poisoning by other antidysrhythmic drugs, accidental (unintentional), initial encounter: Secondary | ICD-10-CM | POA: Diagnosis present

## 2018-07-28 DIAGNOSIS — F329 Major depressive disorder, single episode, unspecified: Secondary | ICD-10-CM

## 2018-07-28 DIAGNOSIS — J9611 Chronic respiratory failure with hypoxia: Secondary | ICD-10-CM

## 2018-07-28 DIAGNOSIS — I480 Paroxysmal atrial fibrillation: Secondary | ICD-10-CM

## 2018-07-28 DIAGNOSIS — Z7901 Long term (current) use of anticoagulants: Secondary | ICD-10-CM

## 2018-07-28 DIAGNOSIS — T462X1D Poisoning by other antidysrhythmic drugs, accidental (unintentional), subsequent encounter: Secondary | ICD-10-CM

## 2018-07-28 LAB — SJOGRENS SYNDROME-B EXTRACTABLE NUCLEAR ANTIBODY

## 2018-07-28 LAB — HEPARIN LEVEL (UNFRACTIONATED)
HEPARIN UNFRACTIONATED: 1.16 [IU]/mL — AB (ref 0.30–0.70)
Heparin Unfractionated: 1.06 IU/mL — ABNORMAL HIGH (ref 0.30–0.70)

## 2018-07-28 LAB — CBC
HCT: 30.5 % — ABNORMAL LOW (ref 36.0–46.0)
Hemoglobin: 9.4 g/dL — ABNORMAL LOW (ref 12.0–15.0)
MCH: 23.3 pg — AB (ref 26.0–34.0)
MCHC: 30.8 g/dL (ref 30.0–36.0)
MCV: 75.7 fL — ABNORMAL LOW (ref 80.0–100.0)
Platelets: 289 10*3/uL (ref 150–400)
RBC: 4.03 MIL/uL (ref 3.87–5.11)
RDW: 22.5 % — ABNORMAL HIGH (ref 11.5–15.5)
WBC: 8 10*3/uL (ref 4.0–10.5)
nRBC: 0 % (ref 0.0–0.2)

## 2018-07-28 LAB — T4, FREE: Free T4: 0.94 ng/dL (ref 0.82–1.77)

## 2018-07-28 LAB — GLUCOSE, CAPILLARY
Glucose-Capillary: 242 mg/dL — ABNORMAL HIGH (ref 70–99)
Glucose-Capillary: 195 mg/dL — ABNORMAL HIGH (ref 70–99)
Glucose-Capillary: 182 mg/dL — ABNORMAL HIGH (ref 70–99)
Glucose-Capillary: 244 mg/dL — ABNORMAL HIGH (ref 70–99)

## 2018-07-28 LAB — STREP PNEUMONIAE URINARY ANTIGEN: Strep Pneumo Urinary Antigen: NEGATIVE

## 2018-07-28 LAB — ANTI-SCLERODERMA ANTIBODY: Scleroderma (Scl-70) (ENA) Antibody, IgG: <0.2 AI (ref 0.0–0.9)

## 2018-07-28 LAB — BASIC METABOLIC PANEL
Anion gap: 15 (ref 5–15)
BUN: 28 mg/dL — ABNORMAL HIGH (ref 8–23)
CO2: 27 mmol/L (ref 22–32)
Calcium: 8.8 mg/dL — ABNORMAL LOW (ref 8.9–10.3)
Chloride: 89 mmol/L — ABNORMAL LOW (ref 98–111)
Creatinine, Ser: 1.06 mg/dL — ABNORMAL HIGH (ref 0.44–1.00)
GFR calc Af Amer: 57 mL/min — ABNORMAL LOW (ref >60)
GFR calc non Af Amer: 49 mL/min — ABNORMAL LOW (ref >60)
Glucose, Bld: 252 mg/dL — ABNORMAL HIGH (ref 70–99)
Potassium: 4.6 mmol/L (ref 3.5–5.1)
Sodium: 131 mmol/L — ABNORMAL LOW (ref 135–145)

## 2018-07-28 LAB — ANTI-DNA ANTIBODY, DOUBLE-STRANDED: ds DNA Ab: <1 IU/mL (ref 0–9)

## 2018-07-28 LAB — PROCALCITONIN: Procalcitonin: 0.27 ng/mL

## 2018-07-28 LAB — SJOGRENS SYNDROME-A EXTRACTABLE NUCLEAR ANTIBODY

## 2018-07-28 LAB — MPO/PR-3 (ANCA) ANTIBODIES
ANCA Proteinase 3: <3.5 U/mL (ref 0.0–3.5)
Myeloperoxidase Abs: <9 U/mL (ref 0.0–9.0)

## 2018-07-28 LAB — CYCLIC CITRUL PEPTIDE ANTIBODY, IGG/IGA: CCP Antibodies IgG/IgA: 8 units (ref 0–19)

## 2018-07-28 LAB — APTT
aPTT: 44 seconds — ABNORMAL HIGH (ref 24–36)
aPTT: 55 seconds — ABNORMAL HIGH (ref 24–36)
aPTT: 40 seconds — ABNORMAL HIGH (ref 24–36)

## 2018-07-28 LAB — ANTINUCLEAR ANTIBODIES, IFA: ANA Ab, IFA: NEGATIVE

## 2018-07-28 LAB — RHEUMATOID FACTOR: Rheumatoid fact SerPl-aCnc: 46.2 IU/mL — ABNORMAL HIGH (ref 0.0–13.9)

## 2018-07-28 LAB — GLOMERULAR BASEMENT MEMBRANE ANTIBODIES: GBM Ab: 3 units (ref 0–20)

## 2018-07-28 MED ORDER — BISACODYL 10 MG RE SUPP
10.0000 mg | Freq: Once | RECTAL | Status: AC
  Administered 2018-07-28: 10 mg via RECTAL
  Filled 2018-07-28: qty 1

## 2018-07-28 MED ORDER — INSULIN ASPART 100 UNIT/ML ~~LOC~~ SOLN
0.0000 [IU] | Freq: Three times a day (TID) | SUBCUTANEOUS | Status: DC
  Administered 2018-07-28: 2 [IU] via SUBCUTANEOUS
  Administered 2018-07-28 – 2018-07-29 (×3): 3 [IU] via SUBCUTANEOUS
  Administered 2018-07-29: 5 [IU] via SUBCUTANEOUS
  Administered 2018-07-29 – 2018-07-30 (×2): 3 [IU] via SUBCUTANEOUS
  Administered 2018-07-30: 7 [IU] via SUBCUTANEOUS
  Administered 2018-07-30: 5 [IU] via SUBCUTANEOUS

## 2018-07-28 MED ORDER — TECHNETIUM TC 99M PYROPHOSPHATE
19.3000 | Freq: Once | INTRAVENOUS | Status: AC
  Administered 2018-07-28: 19.3 via INTRAVENOUS

## 2018-07-28 MED ORDER — FLEET ENEMA 7-19 GM/118ML RE ENEM
1.0000 | ENEMA | Freq: Once | RECTAL | Status: DC
  Filled 2018-07-28: qty 1

## 2018-07-28 MED ORDER — INSULIN ASPART 100 UNIT/ML ~~LOC~~ SOLN
0.0000 [IU] | Freq: Every day | SUBCUTANEOUS | Status: DC
  Administered 2018-07-28: 2 [IU] via SUBCUTANEOUS
  Administered 2018-07-29: 4 [IU] via SUBCUTANEOUS
  Administered 2018-07-30: 2 [IU] via SUBCUTANEOUS
  Administered 2018-07-31: 3 [IU] via SUBCUTANEOUS
  Administered 2018-08-01: 2 [IU] via SUBCUTANEOUS
  Administered 2018-08-02: 3 [IU] via SUBCUTANEOUS
  Administered 2018-08-03: 4 [IU] via SUBCUTANEOUS

## 2018-07-28 NOTE — Progress Notes (Signed)
NAME:  OZETTA Mullins, MRN:  [id number], DOB:  05-08-1934, LOS: 5 ADMISSION DATE:  07/23/2018, CONSULTATION DATE:  07/27/18 REFERRING MD:  Wynelle Cleveland  CHIEF COMPLAINT:  SOB   Brief History   Brooke Mullins is a 84 y.o. female who was admitted 12/22 after fall.  Found to have right hip fx's that are being managed conservatively. PCCM called 12/26 due to high FiO2 needs due to possible underlying IPF (see discussion in HPI regarding IPF diagnosis).  Past Medical History  IPF on 3L O2, A.fib, subclavian artery stenosis, HTN, HLD, CAD, PSVT, PVD, depression, hypothyroidism.  Significant Hospital Events   12/22 > admit. 12/26 > PCCM consult. Steroids increased.  12/27: negative 2.4 liters. Sed rate and RF elevated  Consults:  Orthopedics. PCCM.  Procedures:  None.  Significant Diagnostic Tests:  CT chest 12/25 > Diffuse GGO's with bronchiectasis.  New compression fx of T4 vertebral body.  Micro Data:  Sputum 12/26 >   Antimicrobials:  None.   Interim history/subjective:  She feels a little better.  Currently up in chair.  Remains on high flow oxygen, I personally changed this to 30 L in effort to decrease, FiO2 requirements  Objective:  Blood pressure 120/86, pulse 92, temperature (Abnormal) 95.6 F (35.3 C), temperature source Oral, resp. rate (Abnormal) 21, height 5\' 4"  (1.626 m), weight 58.9 kg, SpO2 92 %.    FiO2 (%):  [50 %-80 %] 80 %   Intake/Output Summary (Last 24 hours) at 07/28/2018 1006 Last data filed at 07/28/2018 0800 Gross per 24 hour  Intake 151.5 ml  Output 1250 ml  Net -1098.5 ml   Filed Weights   07/23/18 1219 07/27/18 0554 07/28/18 0346  Weight: 56.7 kg 61.2 kg 58.9 kg    Examination: General: This is a frail 84 year old female she is resting up in the chair currently she is in no acute distress HEENT normocephalic atraumatic no jugular venous distention mucous membranes are moist Pulmonary: Basilar rales, no accessory use. Cardiac: Regular irregular  no murmur rub or gallop Abdomen: Soft nontender no organomegaly Extremities: Warm dry no significant edema scattered areas of ecchymosis strong pulses Neuro: Awake oriented does have tremor no focal deficits. GU: Clear yellow urine.  Assessment & Plan:  Acute on chronic hypoxic resp failure w/ pulmonary infiltrates. ? ILD flare vs BOOP vs eosinophilic PNA vs NSIP or HP per pt and family, she was recently diagnosed with IPF in November 2019 by Dr. Bernette Mayers in [place] and they state she had biopsies taken and was started on 20mg  prednisone daily.  It is unclear whether these biopsies were bronch with genomics vs histopathology alone).   -RF elevated (46.2) Sed rated elevated (72), anti-scleroderma antibody: neg; Sjogrens syndrome A and & extractable Antibody: negative; GBM antibodies: neg; dsDNA Ab: neg; Ustrep neg   Plan Cont current solumedrol  BIPAP PRN Wean oxygen as able Bronchial hygiene  Amiodarone may not be best choice here, deferring this and additional recommendations to Dr. Chase Caller who will follow [facility] per primary team. F/u HP panel  F/u pending ILD serologies.   Afib Plan Cont apixaban Consider alt medication other than amiodarone Cont tele   Acute on chronic diastolic HF Plan Per cards   Right hip fracture Plan Per IM service  Hyperglycemia Plan ssi   Best Practice:  Diet: Regular Pain/Anxiety/Delirium protocol (if indicated): N/A. VAP protocol (if indicated): N/A. DVT prophylaxis: SCD's / apixaban. GI prophylaxis: Pantoprazole. Glucose control: N/A. Mobility: Bedrest. Code Status: DNR. Family Communication: Son updated  at bedside. Disposition: Progressive Care.

## 2018-07-28 NOTE — Progress Notes (Signed)
Patient transported to nuclear med on Bipap and back to 3K18 with no complications. Patient placed back on HFNC after transport. Patient tolerated well.

## 2018-07-28 NOTE — Progress Notes (Signed)
Physical Therapy Treatment Patient Details Name: Brooke Mullins MRN: [id number] DOB: 13-Apr-1934 Today's Date: 07/28/2018    History of Present Illness Pt is 84 yo female who fell at home and sustained R acetabular and pubic ramus fx in setting of past hemiarthroplasty. PMH: GERD, HTN, a fib, DM, PVD, and osteoporosis.    PT Comments    Pt making steady progress with mobility. Motivated to work toward independence.    Follow Up Recommendations  SNF;Supervision/Assistance - 24 hour     Equipment Recommendations  None recommended by PT    Recommendations for Other Services       Precautions / Restrictions Precautions Precautions: Fall Precaution Comments: pt on HFNC Restrictions Weight Bearing Restrictions: Yes RLE Weight Bearing: Touchdown weight bearing    Mobility  Bed Mobility Overal bed mobility: Needs Assistance Bed Mobility: Sit to Supine     Supine to sit: Min assist Sit to supine: Min assist   General bed mobility comments: Assist to bring rt leg up into bed  Transfers Overall transfer level: Needs assistance Equipment used: Rolling walker (2 wheeled) Transfers: Sit to/from [facility] Sit to Stand: Min assist Stand pivot transfers: Mod assist;+2 safety/equipment       General transfer comment: Assist to bring hips up and for balance. Verbal cues for hand placement. Assist to pivot with walker from chair to bed with pt unable to maintain TDWB.   Ambulation/Gait                 Stairs             Wheelchair Mobility    Modified Rankin (Stroke Patients Only)       Balance Overall balance assessment: Needs assistance Sitting-balance support: No upper extremity supported;Feet supported Sitting balance-Leahy Scale: Good     Standing balance support: Bilateral upper extremity supported;During functional activity Standing balance-Leahy Scale: Poor Standing balance comment: walker and min assist for static standing                             Cognition Arousal/Alertness: Awake/alert Behavior During Therapy: WFL for tasks assessed/performed Overall Cognitive Status: Within Functional Limits for tasks assessed                                        Exercises      General Comments        Pertinent Vitals/Pain Pain Assessment: Faces Faces Pain Scale: Hurts little more Pain Location: RLE Pain Descriptors / Indicators: Discomfort;Grimacing;Guarding Pain Intervention(s): Monitored during session;Repositioned    Home Living                      Prior Function            PT Goals (current goals can now be found in the care plan section) Acute Rehab PT Goals Patient Stated Goal: Walk again Progress towards PT goals: Progressing toward goals    Frequency    Min 2X/week      PT Plan Current plan remains appropriate    Co-evaluation   Reason for Co-Treatment: Complexity of the patient's impairments (multi-system involvement);For patient/therapist safety   OT goals addressed during session: ADL's and self-care      AM-PAC PT "6 Clicks" Mobility   Outcome Measure  Help needed turning from your back to your side while  in a flat bed without using bedrails?: A Little Help needed moving from lying on your back to sitting on the side of a flat bed without using bedrails?: A Little Help needed moving to and from a bed to a chair (including a wheelchair)?: A Lot Help needed standing up from a chair using your arms (e.g., wheelchair or bedside chair)?: A Little Help needed to walk in hospital room?: Total Help needed climbing 3-5 steps with a railing? : Total 6 Click Score: 13    End of Session Equipment Utilized During Treatment: Gait belt Activity Tolerance: Patient tolerated treatment well Patient left: in bed;with nursing/sitter in room;Other (comment)(Pt being transported to nuclear medicine) Nurse Communication: Mobility status PT Visit  Diagnosis: Unsteadiness on feet (R26.81);Pain;Difficulty in walking, not elsewhere classified (R26.2);History of falling (Z91.81);Muscle weakness (generalized) (M62.81) Pain - Right/Left: Right Pain - part of body: Leg     Time: 1355-1404 PT Time Calculation (min) (ACUTE ONLY): 9 min  Charges:  $Therapeutic Activity: 8-22 mins                     [place] Pager [phone] Office [place] 07/28/2018, 2:20 PM

## 2018-07-28 NOTE — Progress Notes (Signed)
Wend down to nuclear med for procedure, dulcolax supp given after with no results yet. Offered fleet enema as ordered but claimed to wait for more hours  for dulcolax  to take effect.

## 2018-07-28 NOTE — Progress Notes (Signed)
Pt with less pain and able to mobilize with less assist. Difficulty maintaining TDWB status on R LE. 02 sats dropped to 77% (inconsistent wave form) with transfer to chair. Continues to be appropriate for SNF level rehab.  07/28/18 1028  OT Visit Information  Last OT Received On 07/28/18  Assistance Needed +2 (for lines)  PT/OT/SLP Co-Evaluation/Treatment Yes  Reason for Co-Treatment Complexity of the patient's impairments (multi-system involvement);For patient/therapist safety  OT goals addressed during session ADL's and self-care  History of Present Illness Pt is 84 yo female who fell at home and sustained R acetabular and pubic ramus fx in setting of past hemiarthroplasty. PMH: GERD, HTN, a fib, DM, PVD, and osteoporosis.  Precautions  Precautions Fall  Precaution Comments pt on HFNC  Pain Assessment  Pain Assessment Faces  Faces Pain Scale 4  Pain Location RLE  Pain Descriptors / Indicators Discomfort;Grimacing;Guarding  Pain Intervention(s) Monitored during session  Cognition  Arousal/Alertness Awake/alert  Behavior During Therapy WFL for tasks assessed/performed  Overall Cognitive Status Within Functional Limits for tasks assessed  ADL  Overall ADL's  Needs assistance/impaired  Grooming Brushing hair;Sitting;Set up  Lower Body Dressing Maximal assistance;Bed level  Lower Body Dressing Details (indicate cue type and reason) socks  Toilet Transfer +2 for physical assistance;Minimal assistance;Stand-pivot;RW  General ADL Comments pt with difficulty maintaining TDWB status on R LE  Bed Mobility  Overal bed mobility Needs Assistance  Bed Mobility Supine to Sit  Supine to sit Min assist  General bed mobility comments Min assist for initiation of RLE movement off of bed. Pt with good use of BUE's for pushing off of rail  Balance  Overall balance assessment Needs assistance  Sitting balance-Leahy Scale Good  Standing balance-Leahy Scale Poor  Standing balance comment Reliant on  UE support  Restrictions  Weight Bearing Restrictions Yes  RLE Weight Bearing TWB  Transfers  Overall transfer level Needs assistance  Equipment used Rolling walker (2 wheeled)  Transfers Sit to/from Stand;Stand Pivot Transfers  Sit to Stand Min assist  Stand pivot transfers +2 safety/equipment;Min assist  General transfer comment Min assist to power up to stand and perform stand pivot towards left side. Requiring max verbal cueing for maintaining RLE weightbearing precautions  OT - End of Session  Equipment Utilized During Treatment Gait belt;Rolling walker;Oxygen  Activity Tolerance Patient limited by fatigue  Patient left in chair;with call bell/phone within reach;with family/visitor present  Nurse Communication Mobility status  OT Assessment/Plan  OT Plan Discharge plan remains appropriate  OT Visit Diagnosis Unsteadiness on feet (R26.81);Other abnormalities of gait and mobility (R26.89);Muscle weakness (generalized) (M62.81);Other symptoms and signs involving cognitive function;Pain  Pain - Right/Left Right  Pain - part of body Leg  OT Frequency (ACUTE ONLY) Min 2X/week  Follow Up Recommendations SNF;Supervision/Assistance - 24 hour  AM-PAC OT "6 Clicks" Daily Activity Outcome Measure (Version 2)  Help from another person eating meals? 4  Help from another person taking care of personal grooming? 3  Help from another person toileting, which includes using toliet, bedpan, or urinal? 2  Help from another person bathing (including washing, rinsing, drying)? 2  Help from another person to put on and taking off regular upper body clothing? 3  Help from another person to put on and taking off regular lower body clothing? 2  6 Click Score 16  OT Goal Progression  Progress towards OT goals Progressing toward goals  Acute Rehab OT Goals  Patient Stated Goal Walk again  OT Goal Formulation With patient  Time For Goal Achievement 08/07/18  Potential to Achieve Goals Good  OT Time  Calculation  OT Start Time (ACUTE ONLY) 0837  OT Stop Time (ACUTE ONLY) 0900  OT Time Calculation (min) 23 min  OT General Charges  $OT Visit 1 Visit  OT Treatments  $Therapeutic Activity 8-22 mins  Nestor Lewandowsky, OTR/L Acute Rehabilitation Services Pager: [phone] Office: [phone]

## 2018-07-28 NOTE — Progress Notes (Signed)
Back from nuclear med by bed awake and alert. 

## 2018-07-28 NOTE — Progress Notes (Signed)
Physical Therapy Treatment Patient Details Name: Brooke Mullins MRN: [id number] DOB: [birth year] Today's Date: 07/28/2018    History of Present Illness Pt is 84 yo female who fell at home and sustained R acetabular and pubic ramus fx in setting of past hemiarthroplasty. PMH: GERD, HTN, a fib, DM, PVD, and osteoporosis.    PT Comments    Patient making excellent progress towards her physical therapy goals and demonstrates improved pain control this session. Able to participate in bed level therapeutic exercises for strengthening RLE and perform stand pivot transfer with min assist and walker. Pt on 25L via HFNC with one episode of desaturation to 77% (poor pleth), returned to 90's with cues for pursed lip breathing and seated rest break; HR peak at 119 bpm. Continues to require frequent cueing to maintain weightbearing status. Will progress as tolerated.     Follow Up Recommendations  SNF;Supervision/Assistance - 24 hour     Equipment Recommendations  None recommended by PT    Recommendations for Other Services       Precautions / Restrictions Precautions Precautions: Fall Restrictions Weight Bearing Restrictions: Yes RLE Weight Bearing: Touchdown weight bearing    Mobility  Bed Mobility Overal bed mobility: Needs Assistance Bed Mobility: Supine to Sit     Supine to sit: Min assist     General bed mobility comments: Min assist for initiation of RLE movement off of bed. Pt with good use of BUE's for pushing off of rail  Transfers Overall transfer level: Needs assistance Equipment used: Rolling walker (2 wheeled) Transfers: Sit to/from [facility] Sit to Stand: Min assist Stand pivot transfers: +2 safety/equipment;Min assist       General transfer comment: Min assist to power up to stand and perform stand pivot towards left side. Requiring max verbal cueing for maintaining RLE weightbearing precautions  Ambulation/Gait                 Stairs              Wheelchair Mobility    Modified Rankin (Stroke Patients Only)       Balance Overall balance assessment: Needs assistance Sitting-balance support: No upper extremity supported;Feet supported Sitting balance-Leahy Scale: Good     Standing balance support: Bilateral upper extremity supported;During functional activity Standing balance-Leahy Scale: Poor Standing balance comment: Reliant on UE support                            Cognition Arousal/Alertness: Awake/alert Behavior During Therapy: WFL for tasks assessed/performed Overall Cognitive Status: Within Functional Limits for tasks assessed                                        Exercises General Exercises - Lower Extremity Heel Slides: 5 reps;Right;Supine Hip ABduction/ADduction: 5 reps;Right;Supine Straight Leg Raises: 5 reps;Right;Supine    General Comments        Pertinent Vitals/Pain Pain Assessment: Faces Faces Pain Scale: Hurts little more Pain Location: RLE Pain Descriptors / Indicators: Discomfort;Grimacing;Guarding Pain Intervention(s): Monitored during session    Home Living                      Prior Function            PT Goals (current goals can now be found in the care plan section) Acute Rehab PT Goals Patient  Stated Goal: Walk again PT Goal Formulation: With patient Time For Goal Achievement: 08/07/18 Potential to Achieve Goals: Good Progress towards PT goals: Progressing toward goals    Frequency    Min 2X/week      PT Plan Current plan remains appropriate    Co-evaluation PT/OT/SLP Co-Evaluation/Treatment: Yes Reason for Co-Treatment: For patient/therapist safety;To address functional/ADL transfers PT goals addressed during session: Mobility/safety with mobility        AM-PAC PT "6 Clicks" Mobility   Outcome Measure  Help needed turning from your back to your side while in a flat bed without using bedrails?: A  Little Help needed moving from lying on your back to sitting on the side of a flat bed without using bedrails?: A Little Help needed moving to and from a bed to a chair (including a wheelchair)?: A Little Help needed standing up from a chair using your arms (e.g., wheelchair or bedside chair)?: A Little Help needed to walk in hospital room?: A Lot Help needed climbing 3-5 steps with a railing? : Total 6 Click Score: 15    End of Session Equipment Utilized During Treatment: Gait belt Activity Tolerance: Patient tolerated treatment well Patient left: in chair;with call bell/phone within reach;with family/visitor present Nurse Communication: Mobility status PT Visit Diagnosis: Unsteadiness on feet (R26.81);Pain;Difficulty in walking, not elsewhere classified (R26.2);History of falling (Z91.81);Muscle weakness (generalized) (M62.81) Pain - Right/Left: Right Pain - part of body: Leg     Time: 4158-3094 PT Time Calculation (min) (ACUTE ONLY): 26 min  Charges:  $Therapeutic Activity: 8-22 mins                     Ellamae Sia, PT, DPT Acute Rehabilitation Services Pager [phone] Office [phone]   Willy Eddy 07/28/2018, 10:20 AM

## 2018-07-28 NOTE — Progress Notes (Signed)
ANTICOAGULATION CONSULT NOTE - Follow Up Consult  Pharmacy Consult for heparin Indication: atrial fibrillation   Labs: Recent Labs    07/27/18 0148 07/27/18 1326 07/27/18 1652 07/28/18 0246  HGB 9.0*  --   --  9.4*  HCT 29.4*  --   --  30.5*  PLT 238  --   --  289  APTT  --   --  32 44*  HEPARINUNFRC  --   --  1.34* 1.16*  CREATININE 0.89  --   --  1.06*  CKTOTAL  --  14*  --   --   CKMB  --  1.1  --   --     Assessment: 84yo female subtherapeutic on heparin with initial dosing while Eliquis on hold; no gtt issues or signs of bleeding per RN.  Goal of Therapy:  aPTT 66-102 seconds   Plan:  Will increase heparin gtt by 3 units/kg/hr to 900 units/hr and check level in 8 hours.    Wynona Neat, PharmD, BCPS  07/28/2018,4:28 AM

## 2018-07-28 NOTE — Care Management Important Message (Signed)
Important Message  Patient Details  Name: Brooke Mullins MRN: [id number] Date of Birth: [birth year]   Medicare Important Message Given:  Yes    Barb Merino [name] 07/28/2018, 4:39 PM

## 2018-07-28 NOTE — Progress Notes (Signed)
Progress Note  Patient Name: [name] Date of Encounter: 07/28/2018  Primary Cardiologist: [name], MD   Subjective   She feels slightly better today, improved SOB.  Inpatient Medications    Scheduled Meds: . escitalopram  20 mg Oral QHS  . feeding supplement (ENSURE ENLIVE)  237 mL Oral BID BM  . insulin aspart  0-5 Units Subcutaneous QHS  . insulin aspart  0-9 Units Subcutaneous TID WC  . iron polysaccharides  150 mg Oral Daily  . levothyroxine  75 mcg Oral QAC breakfast  . methylPREDNISolone (SOLU-MEDROL) injection  60 mg Intravenous Q6H  . metoprolol tartrate  12.5 mg Oral BID  . multivitamin with minerals  1 tablet Oral Daily  . pantoprazole  40 mg Oral Daily   Continuous Infusions: . heparin 900 Units/hr (07/28/18 0430)   PRN Meds: acetaminophen **OR** acetaminophen, albuterol, HYDROcodone-acetaminophen, morphine injection, polyethylene glycol, polyvinyl alcohol   Vital Signs    Vitals:   07/28/18 0240 07/28/18 0346 07/28/18 0748 07/28/18 0802  BP:  122/81 120/86   Pulse: 99 100 90 92  Resp: (!) 24 20 17  (!) 21  Temp:  98.3 F (36.8 C) (!) 95.6 F (35.3 C)   TempSrc:  Oral Oral   SpO2: 90% 95% 95% 92%  Weight:  58.9 kg    Height:        Intake/Output Summary (Last 24 hours) at 07/28/2018 1217 Last data filed at 07/28/2018 1200 Gross per 24 hour  Intake 187.5 ml  Output 1250 ml  Net -1062.5 ml   Filed Weights   07/23/18 1219 07/27/18 0554 07/28/18 0346  Weight: 56.7 kg 61.2 kg 58.9 kg    Telemetry    SR, PACs- Personally Reviewed  Physical Exam  On facial mask with O2 in no distress GEN: No acute distress.   Neck: No JVD Cardiac: RRR, no murmurs, rubs, or gallops.  Respiratory: fine crackles bilaterally. GI: Soft, nontender, non-distended  MS: No edema; No deformity. Neuro:  Nonfocal  Psych: Normal affect   Labs    Chemistry Recent Labs  Lab 07/23/18 1212 07/24/18 0228 07/27/18 0148 07/28/18 0246  NA 138 138 131*  131*  K 3.6 4.3 4.1 4.6  CL 99 95* 92* 89*  CO2 25 28 29 27   GLUCOSE 153* 124* 122* 252*  BUN 19 21 17  28*  CREATININE 0.94 0.95 0.89 1.06*  CALCIUM 8.9 8.7* 8.4* 8.8*  PROT 6.3*  --   --   --   ALBUMIN 2.9*  --   --   --   AST 33  --   --   --   ALT 31  --   --   --   ALKPHOS 58  --   --   --   BILITOT 0.8  --   --   --   GFRNONAA 56* 56* >60 49*  GFRAA >60 >60 >60 57*  ANIONGAP 14 15 10 15      Hematology Recent Labs  Lab 07/24/18 0228 07/27/18 0148 07/28/18 0246  WBC 8.7 11.3* 8.0  RBC 3.75* 3.93 4.03  HGB 9.0* 9.0* 9.4*  HCT 28.2* 29.4* 30.5*  MCV 75.2* 74.8* 75.7*  MCH 24.0* 22.9* 23.3*  MCHC 31.9 30.6 30.8  RDW 23.7* 22.5* 22.5*  PLT 260 238 289    Cardiac EnzymesNo results for input(s): TROPONINI in the last 168 hours.  Recent Labs  Lab 07/23/18 1219  TROPIPOC 0.03     BNP Recent Labs  Lab 07/26/18 1653  BNP 1,180.8*     DDimer No results for input(s): DDIMER in the last 168 hours.   Radiology    Ct Chest Wo Contrast  Result Date: 07/26/2018 CLINICAL DATA:  Acute respiratory illness, history atrial fibrillation, hypertension, former smoker, COPD EXAM: CT CHEST WITHOUT CONTRAST TECHNIQUE: Multidetector CT imaging of the chest was performed following the standard protocol without IV contrast. Sagittal and coronal MPR images reconstructed from axial data set. COMPARISON:  05/17/2018 CT a chest, chest radiograph 07/25/2018 FINDINGS: Cardiovascular: Atherosclerotic calcifications aorta, proximal great vessels and coronary arteries. Aorta normal caliber. Minimal enlargement of cardiac chambers. Mitral annular calcification. No pericardial effusion. Mediastinum/Nodes: Esophagus unremarkable. Scattered normal sized mediastinal lymph nodes. Single minimally enlarged RIGHT paratracheal node 11 mm short axis image 43. Base of cervical region unremarkable. Lungs/Pleura: Diffuse mixed interstitial and ground-glass alveolar infiltrates which may represent edema or  atypical infection. Overall pattern unchanged versus previous exam. No pleural effusion, pneumothorax, or discrete pulmonary mass/nodule. Upper Abdomen: Mildly hyperdense liver. Remaining visualized upper abdomen unremarkable. Musculoskeletal: Osseous demineralization with prior spinal augmentation procedures at T8 and T9 as well as a a new compression fracture of T4 vertebral body with anterior and central height loss. IMPRESSION: Extensive atherosclerotic disease including coronary arteries. Mixed interstitial and ground-glass of the liver infiltrates similar to that seen on 05/17/2018, question pulmonary edema or atypical infection. New compression fracture of T4 vertebral body since 05/17/2018. Hyperdense liver, can be seen with hemosiderosis/iron overload, Wilson's disease, glycogen storage disease, and amiodarone therapy; review of patient's electronic health record indicates patient takes amiodarone. Aortic Atherosclerosis (ICD10-I70.0). Electronically Signed   By: Lavonia Dana M.D.   On: 07/26/2018 18:13    Cardiac Studies    Patient Profile     84 y.o. female   Assessment & Plan   Acute on chronic hypoxic respiratory failure with significant underlying interstitial lung disease possibly secondary to amiodarone toxicity Significant LVH with acute on chronic diastolic CHF Chronic atrial fibrillation on chronic anticoagulation Significant coronary calcifications  Plan:  Amiodarone held yesterday, started on iv steroids by PCCM Started Lasix 40 mg IV twice daily x 2 doses, I would hold and just reevaluate in the morning Ordered technetium pyrophosphate nuclear scan to evaluate for TTR amyloidosis, if negative consider cardiac MRI for further evaluation Held Eliquis and plan for right and left sided cardiac catheterization on Monday.  Left-sided cardiac catheterization with help [place] in navigational further atrial fibrillation therapy (possible dofetilide) Started heparin drip, follow Hb  closely, she has microcytic anemia, workup per primary team  For questions or updates, please contact [facility] Please consult [email or web address] for contact info under        Signed, Ena Dawley, MD  07/28/2018, 12:17 PM

## 2018-07-28 NOTE — Progress Notes (Signed)
PROGRESS NOTE    Brooke Mullins   NFA:[id number]  DOB: [birth year]  DOA: 07/23/2018 PCP: [name], MD   Brief Narrative:  [name] 84 year old female with history of recently diagnosed pulmonary fibrosis chronic hypoxic respiratory failure on 2 L nasal cannula just prior to admission, persistent A- fib, secondary pulm HTN, PVD came to the ER after fall at home.    Admitted from 04/25/18- 04/28/18 to the cardiology service (as a transfer from [place]) for dyspnea with a productive cough, hypoxia needing a BiPAP. She was diuresed and was weaned down to about 3-4 l O2 when discharged.  She followed up later with a pulmonologist in [place], Dr Bernette Mayers, underwent a lung biopsy and was diagnosed with IPF and started on 20 mg of Prednisone daily. She was also prescribed another medications which they are trying to get approved through her insurance.  She had noted more shortness of breath the day before the fall that brought her to the ED.   In the ER, she was found to have right pelvic fracture, was admitted for pain control & nonoperative management. H and P notes that her pulse ox was 81% the day before.  I started to see her on 12/25 and noted she was on 100% FiO2 via a Face mask. It was noted the night before the she had become more hypoxic. A dose of IV Solumedrol and one of IV Lasix were given. CXR showed interstitial pulmonary edema.  Subjective: On high flow nasal cannula now. She has no complaints.     Assessment & Plan:   Principal Problem:   Acute on chronic respiratory failure with hypoxia  H/o pulmonary fibrosis on Prednisone - last admitted in Sept with acute resp failure from CHF, ECHO showed severe concentric LV wall thickening with speckled intraventricular septum suggestive of infiltrative cardiomyopathy such as amyloidosis.  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 / chronic diastolic CHF/ secondary pulm HTN - see above in regards to diuretics     Pelvic fracture/   Fall at home, initial encounter - pain control    Hypothyroidism - Synthroid  DVT prophylaxis: Eliquis Code Status: DNR Family Communication: daughter and son Disposition Plan:  Follow in SDU Consultants:   PCCM  Cardiology Procedures:   none Antimicrobials:  Anti-infectives (From admission, onward)   None       Objective: Vitals:   07/28/18 0802 07/28/18 1200 07/28/18 1500 07/28/18 1532  BP:  102/64  (!) 137/53  Pulse: 92 (!) 58  84  Resp: (!) 21 19  (!) 27  Temp:  98.7 F (37.1 C)  (!) 97.3 F (36.3 C)  TempSrc:  Oral  Oral  SpO2: 92% 97% 94% 93%  Weight:      Height:        Intake/Output Summary (Last 24 hours) at 07/28/2018 1657 Last data filed at 07/28/2018 1600 Gross per 24 hour  Intake 216.5 ml  Output 1250 ml  Net -1033.5 ml   Filed Weights   07/23/18  1219 07/27/18 0554 07/28/18 0346  Weight: 56.7 kg 61.2 kg 58.9 kg    Examination: General exam: Appears comfortable  HEENT: PERRLA, oral mucosa moist, no sclera icterus or thrush Respiratory system: Hi Flow nasal cannula- b/l crackles noted- no respiratory distress Cardiovascular system: S1 & S2 heard,  No murmurs  Gastrointestinal system: Abdomen soft, non-tender, nondistended. Normal bowel sound. No organomegaly Central nervous system: Alert and oriented. No focal neurological deficits. Extremities: No cyanosis, clubbing or edema Skin: No rashes or ulcers Psychiatry:  Mood &  affect appropriate.     Data Reviewed: I have personally reviewed following labs and imaging studies  CBC: Recent Labs  Lab 07/23/18 1212 07/24/18 0228 07/27/18 0148 07/28/18 0246  WBC 13.0* 8.7 11.3* 8.0  NEUTROABS 9.8*  --   --   --   HGB 9.9* 9.0* 9.0* 9.4*  HCT 33.5* 28.2* 29.4* 30.5*  MCV 77.7* 75.2* 74.8* 75.7*  PLT 284 260 238 732   Basic Metabolic Panel: Recent Labs  Lab 07/23/18 1212 07/24/18 0228 07/27/18 0148 07/28/18 0246  NA 138 138 131* 131*  K 3.6 4.3 4.1 4.6  CL 99 95* 92* 89*  CO2 25 28 29 27   GLUCOSE 153* 124* 122* 252*  BUN 19 21 17  28*  CREATININE 0.94 0.95 0.89 1.06*  CALCIUM 8.9 8.7* 8.4* 8.8*   GFR: Estimated Creatinine Clearance: 35.3 mL/min (A) (by C-G formula based on SCr of 1.06 mg/dL (H)). Liver Function Tests: Recent Labs  Lab 07/23/18 1212  AST 33  ALT 31  ALKPHOS 58  BILITOT 0.8  PROT 6.3*  ALBUMIN 2.9*   Recent Labs  Lab 07/27/18 1326  LIPASE 52*   No results for input(s): AMMONIA in the last 168 hours. Coagulation Profile: Recent Labs  Lab 07/23/18 1212  INR 1.12   Cardiac Enzymes: Recent Labs  Lab 07/27/18 1326  CKTOTAL 14*  CKMB 1.1   BNP (last 3 results) Recent Labs    05/12/18 1020  PROBNP 2,040*   HbA1C: No results for input(s): HGBA1C in the last 72 hours. CBG: Recent Labs  Lab 07/28/18 0810 07/28/18 1201  GLUCAP 242* 195*   Lipid Profile: No results for input(s): CHOL, HDL, LDLCALC, TRIG, CHOLHDL, LDLDIRECT in the last 72 hours. Thyroid Function Tests: Recent Labs    07/28/18 0246  FREET4 0.94   Anemia Panel: No results for input(s): VITAMINB12, FOLATE, FERRITIN, TIBC, IRON, RETICCTPCT in the last 72 hours. Urine analysis:    Component Value Date/Time   COLORURINE YELLOW (A) 07/23/2018 1516   APPEARANCEUR CLEAR (A) 07/23/2018 1516   LABSPEC 1.020 07/23/2018 1516   PHURINE 7.0 07/23/2018 1516   GLUCOSEU NEGATIVE 07/23/2018 1516   HGBUR NEGATIVE 07/23/2018 1516   BILIRUBINUR  NEGATIVE 07/23/2018 1516   KETONESUR NEGATIVE 07/23/2018 1516   PROTEINUR NEGATIVE 07/23/2018 1516   NITRITE NEGATIVE 07/23/2018 1516   LEUKOCYTESUR POSITIVE (A) 07/23/2018 1516   Sepsis Labs: @LABRCNTIP (procalcitonin:4,lacticidven:4) ) Recent Results (from the past 240 hour(s))  Respiratory Panel by PCR     Status: None   Collection Time: 07/27/18 12:58 PM  Result Value Ref Range Status   Adenovirus NOT DETECTED NOT DETECTED Final   Coronavirus 229E NOT DETECTED NOT DETECTED Final   Coronavirus HKU1 NOT DETECTED NOT DETECTED Final   Coronavirus NL63 NOT DETECTED NOT DETECTED Final   Coronavirus OC43 NOT DETECTED NOT DETECTED Final   Metapneumovirus NOT DETECTED NOT DETECTED Final   Rhinovirus / Enterovirus NOT DETECTED NOT DETECTED Final   Influenza A NOT  DETECTED NOT DETECTED Final   Influenza B NOT DETECTED NOT DETECTED Final   Parainfluenza Virus 1 NOT DETECTED NOT DETECTED Final   Parainfluenza Virus 2 NOT DETECTED NOT DETECTED Final   Parainfluenza Virus 3 NOT DETECTED NOT DETECTED Final   Parainfluenza Virus 4 NOT DETECTED NOT DETECTED Final   Respiratory Syncytial Virus NOT DETECTED NOT DETECTED Final   Bordetella pertussis NOT DETECTED NOT DETECTED Final   Chlamydophila pneumoniae NOT DETECTED NOT DETECTED Final   Mycoplasma pneumoniae NOT DETECTED NOT DETECTED Final    Comment: Performed at [facility], [place] [address] [place], [place] [address]         Radiology Studies: Ct Chest Wo Contrast  Result Date: 07/26/2018 CLINICAL DATA:  Acute respiratory illness, history atrial fibrillation, hypertension, former smoker, COPD EXAM: CT CHEST WITHOUT CONTRAST TECHNIQUE: Multidetector CT imaging of the chest was performed following the standard protocol without IV contrast. Sagittal and coronal MPR images reconstructed from axial data set. COMPARISON:  05/17/2018 CT a chest, chest radiograph 07/25/2018 FINDINGS: Cardiovascular: Atherosclerotic calcifications aorta,  proximal great vessels and coronary arteries. Aorta normal caliber. Minimal enlargement of cardiac chambers. Mitral annular calcification. No pericardial effusion. Mediastinum/Nodes: Esophagus unremarkable. Scattered normal sized mediastinal lymph nodes. Single minimally enlarged RIGHT paratracheal node 11 mm short axis image 43. Base of cervical region unremarkable. Lungs/Pleura: Diffuse mixed interstitial and ground-glass alveolar infiltrates which may represent edema or atypical infection. Overall pattern unchanged versus previous exam. No pleural effusion, pneumothorax, or discrete pulmonary mass/nodule. Upper Abdomen: Mildly hyperdense liver. Remaining visualized upper abdomen unremarkable. Musculoskeletal: Osseous demineralization with prior spinal augmentation procedures at T8 and T9 as well as a a new compression fracture of T4 vertebral body with anterior and central height loss. IMPRESSION: Extensive atherosclerotic disease including coronary arteries. Mixed interstitial and ground-glass of the liver infiltrates similar to that seen on 05/17/2018, question pulmonary edema or atypical infection. New compression fracture of T4 vertebral body since 05/17/2018. Hyperdense liver, can be seen with hemosiderosis/iron overload, Wilson's disease, glycogen storage disease, and amiodarone therapy; review of patient's electronic health record indicates patient takes amiodarone. Aortic Atherosclerosis (ICD10-I70.0). Electronically Signed   By: Lavonia Dana M.D.   On: 07/26/2018 18:13      Scheduled Meds: . escitalopram  20 mg Oral QHS  . feeding supplement (ENSURE ENLIVE)  237 mL Oral BID BM  . insulin aspart  0-5 Units Subcutaneous QHS  . insulin aspart  0-9 Units Subcutaneous TID WC  . iron polysaccharides  150 mg Oral Daily  . levothyroxine  75 mcg Oral QAC breakfast  . methylPREDNISolone (SOLU-MEDROL) injection  60 mg Intravenous Q6H  . metoprolol tartrate  12.5 mg Oral BID  . multivitamin with  minerals  1 tablet Oral Daily  . pantoprazole  40 mg Oral Daily  . sodium phosphate  1 enema Rectal Once   Continuous Infusions: . heparin 1,100 Units/hr (07/28/18 1600)     LOS: 5 days    Time spent in minutes: 35    Debbe Odea, MD [facility] Pager: [email or web address] Password [email or web address] 07/28/2018, 4:57 PM

## 2018-07-28 NOTE — Progress Notes (Signed)
ANTICOAGULATION CONSULT NOTE - Follow Up Consult  Pharmacy Consult for IV heparin (Eliquis on hold) Indication: atrial fibrillation  Allergies  Allergen Reactions  . Penicillins Anaphylaxis and Other (See Comments)    Has patient had a PCN reaction causing immediate rash, facial/tongue/throat swelling, SOB or lightheadedness with hypotension: Yes Has patient had a PCN reaction causing severe rash involving mucus membranes or skin necrosis: No Has patient had a PCN reaction that required hospitalization: No Has patient had a PCN reaction occurring within the last 10 years: No If all of the above answers are "NO", then may proceed with Cephalosporin use.   . Atorvastatin Other (See Comments)    Muscle pain  . Codeine Other (See Comments)    Nausea/Vomiting  . Colesevelam Other (See Comments)    Unsure of exact reaction type  . Ezetimibe Other (See Comments)    Pain in legs    Patient Measurements: Height: 5\' 4"  (162.6 cm) Weight: 129 lb 13.6 oz (58.9 kg) IBW/kg (Calculated) : 54.7 Heparin Dosing Weight: 56.7 kg  Vital Signs: Temp: 98.7 F (37.1 C) (12/27 1200) Temp Source: Oral (12/27 1200) BP: 102/64 (12/27 1200) Pulse Rate: 58 (12/27 1200)  Labs: Recent Labs    07/27/18 0148 07/27/18 1326  07/27/18 1652 07/28/18 0246 07/28/18 1200 07/28/18 1255  HGB 9.0*  --   --   --  9.4*  --   --   HCT 29.4*  --   --   --  30.5*  --   --   PLT 238  --   --   --  289  --   --   APTT  --   --    < > 32 44* 55* 40*  HEPARINUNFRC  --   --   --  1.34* 1.16*  --  1.06*  CREATININE 0.89  --   --   --  1.06*  --   --   CKTOTAL  --  14*  --   --   --   --   --   CKMB  --  1.1  --   --   --   --   --    < > = values in this interval not displayed.    Estimated Creatinine Clearance: 35.3 mL/min (A) (by C-G formula based on SCr of 1.06 mg/dL (H)).   Medical History: Past Medical History:  Diagnosis Date  . Atrial fibrillation ([place])   . Carotid artery stenosis with cerebral  infarction ([place])   . Carotid atherosclerosis, bilateral   . Chronic bronchitis ([place])   . Depression   . Dyslipidemia   . Edema   . History of blood transfusion 04/24/2018   "low HgB" (04/25/2018)  . Hyperplastic colon polyp   . Hypertension   . Hypothyroidism   . Osteoporosis   . Peripheral vascular disease of extremity ([place])   . PSVT (paroxysmal supraventricular tachycardia) ([place])   . Skin cancer    "scraped off my neck and legs" (04/25/2018)  . Subclavian artery stenosis ([facility])     Medications:  Infusions:  . heparin 900 Units/hr (07/28/18 0430)    Assessment: 84 yo on chronic Eliquis for afib, currently held for [facility] next week.  Has not had Eliquis dose since 12/25 AM since on bipap and unable to take po. Patient is currently on IV heparin at 900 units/hr. APTT this afternoon remains subtherapeutic at 40 on 900 units/hr. HL remains falsely elevated at 1.06. No bleeding reported per RN.  Goal of Therapy:  Heparin level 0.3-0.7 units/ml  APTT 66-102s Monitor platelets by anticoagulation protocol: Yes   Plan:  1. Increase IV heparin to 1100 units/hr  2. Check aPTT in 8 hrs. 3. Daily aPTT, heparin level and CBC. 4. F/u plans for anticoagulation after cath next week.  Albertina Parr, PharmD., BCPS Clinical Pharmacist Clinical phone for 07/28/18 until 10:30pm: [phone] If after 10:30pm, please refer to [facility] for unit-specific pharmacist

## 2018-07-28 NOTE — Progress Notes (Signed)
TRansported down to nuclear med. with RN and Resp. Therapist  since pt on bipap. Awake and alert.

## 2018-07-29 LAB — GLUCOSE, CAPILLARY
GLUCOSE-CAPILLARY: 250 mg/dL — AB (ref 70–99)
Glucose-Capillary: 260 mg/dL — ABNORMAL HIGH (ref 70–99)
Glucose-Capillary: 229 mg/dL — ABNORMAL HIGH (ref 70–99)
Glucose-Capillary: 327 mg/dL — ABNORMAL HIGH (ref 70–99)

## 2018-07-29 LAB — CBC
HCT: 29.2 % — ABNORMAL LOW (ref 36.0–46.0)
Hemoglobin: 9.1 g/dL — ABNORMAL LOW (ref 12.0–15.0)
MCH: 23.4 pg — ABNORMAL LOW (ref 26.0–34.0)
MCHC: 31.2 g/dL (ref 30.0–36.0)
MCV: 75.1 fL — ABNORMAL LOW (ref 80.0–100.0)
Platelets: 324 10*3/uL (ref 150–400)
RBC: 3.89 MIL/uL (ref 3.87–5.11)
RDW: 22.2 % — ABNORMAL HIGH (ref 11.5–15.5)
WBC: 10.8 10*3/uL — ABNORMAL HIGH (ref 4.0–10.5)
nRBC: 0 % (ref 0.0–0.2)

## 2018-07-29 LAB — BASIC METABOLIC PANEL
Anion gap: 15 (ref 5–15)
BUN: 48 mg/dL — ABNORMAL HIGH (ref 8–23)
CO2: 26 mmol/L (ref 22–32)
CREATININE: 1.33 mg/dL — AB (ref 0.44–1.00)
Calcium: 8.9 mg/dL (ref 8.9–10.3)
Chloride: 90 mmol/L — ABNORMAL LOW (ref 98–111)
GFR calc Af Amer: 43 mL/min — ABNORMAL LOW (ref >60)
GFR calc non Af Amer: 37 mL/min — ABNORMAL LOW (ref >60)
Glucose, Bld: 262 mg/dL — ABNORMAL HIGH (ref 70–99)
Potassium: 3.9 mmol/L (ref 3.5–5.1)
Sodium: 131 mmol/L — ABNORMAL LOW (ref 135–145)

## 2018-07-29 LAB — APTT
aPTT: 99 seconds — ABNORMAL HIGH (ref 24–36)
aPTT: 84 seconds — ABNORMAL HIGH (ref 24–36)

## 2018-07-29 LAB — HEPARIN LEVEL (UNFRACTIONATED): Heparin Unfractionated: 1.18 IU/mL — ABNORMAL HIGH (ref 0.30–0.70)

## 2018-07-29 MED ORDER — SODIUM CHLORIDE 0.9 % IV SOLN
250.0000 mg | Freq: Four times a day (QID) | INTRAVENOUS | Status: DC
  Administered 2018-07-29 – 2018-08-01 (×12): 250 mg via INTRAVENOUS
  Filled 2018-07-29 (×16): qty 2

## 2018-07-29 MED ORDER — POLYETHYLENE GLYCOL 3350 17 G PO PACK
17.0000 g | PACK | Freq: Every day | ORAL | Status: DC
  Administered 2018-07-30 – 2018-08-01 (×3): 17 g via ORAL
  Filled 2018-07-29 (×5): qty 1

## 2018-07-29 NOTE — Progress Notes (Signed)
ANTICOAGULATION CONSULT NOTE - Follow Up Consult  Pharmacy Consult for heparin Indication: atrial fibrillation   Labs: Recent Labs    07/27/18 0148 07/27/18 1326  07/27/18 1652 07/28/18 0246 07/28/18 1200 07/28/18 1255 07/28/18 2309  HGB 9.0*  --   --   --  9.4*  --   --   --   HCT 29.4*  --   --   --  30.5*  --   --   --   PLT 238  --   --   --  289  --   --   --   APTT  --   --    < > 32 44* 55* 40* 99*  HEPARINUNFRC  --   --   --  1.34* 1.16*  --  1.06*  --   CREATININE 0.89  --   --   --  1.06*  --   --   --   CKTOTAL  --  14*  --   --   --   --   --   --   CKMB  --  1.1  --   --   --   --   --   --    < > = values in this interval not displayed.    Assessment/Plan:  84yo female therapeutic on heparin after rate changes. Will continue gtt at current rate and confirm stable with am labs.   Wynona Neat, PharmD, BCPS  07/29/2018,12:39 AM

## 2018-07-29 NOTE — Progress Notes (Signed)
PROGRESS NOTE    Brooke Mullins   PTW:[id number]  DOB: [birth year]  DOA: 07/23/2018 PCP: Park Liter, MD   Brief Narrative:  [name] 84 year old female with history of recently diagnosed pulmonary fibrosis chronic hypoxic respiratory failure on 2 L nasal cannula just prior to admission, persistent A- fib, secondary pulm HTN, PVD came to the ER after fall at home.    Admitted from 04/25/18- 04/28/18 to the cardiology service (as a transfer from [place]) for dyspnea with a productive cough, hypoxia needing a BiPAP. She was diuresed and was weaned down to about 3-4 l O2 when discharged.  She followed up later with a pulmonologist in [place], Dr Bernette Mayers, underwent a lung biopsy and was diagnosed with IPF and started on 20 mg of Prednisone daily. She was also prescribed another medications which they are trying to get approved through her insurance.  She had noted more shortness of breath the day before the fall that brought her to the ED.   In the ER, she was found to have right pelvic fracture, was admitted for pain control & nonoperative management. H and P notes that her pulse ox was 81% the day before.  I started to see her on 12/25 and noted she was on 100% FiO2 via a Face mask. It was noted the night before the she had become more hypoxic. A dose of IV Solumedrol and one of IV Lasix were given. CXR showed interstitial pulmonary edema.  Subjective: No complaints. Eating, drinking OK. Pain controlled. No cough or dyspnea at rest.     Assessment & Plan:   Principal Problem:   Acute on chronic respiratory failure with hypoxia  H/o pulmonary fibrosis on Prednisone - last admitted in Sept with acute resp failure from CHF, ECHO showed severe concentric LV wall thickening with speckled intraventricular septum suggestive of infiltrative cardiomyopathy such as amyloidosis.  - CXR suggestive of pulm edema- given multiple doses of Lasix   - 12/25- not improving- checked chest CT-  showed b/l ground glass infiltrates- checked pro calcitonin which was negative - transfered to SDU for BiPAP on 12/25- given further Lasix    - 12/26 - not improving- cardiology and pulmonary consults requested- suspecting Amio induced pulmonary toxicity - Amio stopped and IV steroids started by Pulm- remains on ~ 75% Fio2 on High flow NS  Active Problems:     Persistent atrial fibrillation - cont Metoprolol, Amio, Heparin infusion   Mild AKI   - baseline Cr ~ 0.8-0.9- Cr currently 1.33 with elevated BUN likely a bit dry due to lasix- follow urine output    Infiltrative cardiomyopathy- ?Amyloidosis / chronic diastolic CHF/ secondary pulm HTN - see above in regards to diuretics - cardiology following - TTR Amyloid scan neg for amyloid per Cardiology - right and left heart cath planned for Monday    Pelvic fracture/   Fall at home, initial encounter - pain control    Hypothyroidism - Synthroid  Constipated - Dulcolax did not help- did not get enema- start Miralax daily  DVT prophylaxis: Eliquis Code Status: DNR Family Communication: daughter and son Disposition Plan:  Follow in SDU Consultants:   PCCM  Cardiology Procedures:   none Antimicrobials:  Anti-infectives (From admission, onward)   None       Objective: Vitals:   07/29/18 0700 07/29/18 0735 07/29/18 1200 07/29/18 1326  BP: (!) 148/79  140/66   Pulse:  76 74 64  Resp:  18 (!) 21 19  Temp:  97.9 F (36.6 C)   TempSrc:   Oral   SpO2:  97% 98% 96%  Weight:      Height:        Intake/Output Summary (Last 24 hours) at 07/29/2018 1328 Last data filed at 07/29/2018 1300 Gross per 24 hour  Intake 868 ml  Output 250 ml  Net 618 ml   Filed Weights   07/27/18 0554 07/28/18 0346 07/29/18 0500  Weight: 61.2 kg 58.9 kg 58.8 kg    Examination: General exam: Appears comfortable  HEENT: PERRLA, oral mucosa moist, no sclera icterus or thrush Respiratory system: Hi Flow nasal cannula at 75% today - b/l  crackles noted- no respiratory distress Cardiovascular system: S1 & S2 heard,  No murmurs  Gastrointestinal system: Abdomen soft, non-tender, mildly distended today- Normal bowel sound.   Central nervous system: Alert and oriented. No focal neurological deficits. Extremities: No cyanosis, clubbing or edema Skin: No rashes or ulcers Psychiatry:  Mood & affect appropriate.     Data Reviewed: I have personally reviewed following labs and imaging studies  CBC: Recent Labs  Lab 07/23/18 1212 07/24/18 0228 07/27/18 0148 07/28/18 0246 07/29/18 0204  WBC 13.0* 8.7 11.3* 8.0 10.8*  NEUTROABS 9.8*  --   --   --   --   HGB 9.9* 9.0* 9.0* 9.4* 9.1*  HCT 33.5* 28.2* 29.4* 30.5* 29.2*  MCV 77.7* 75.2* 74.8* 75.7* 75.1*  PLT 284 260 238 289 053   Basic Metabolic Panel: Recent Labs  Lab 07/23/18 1212 07/24/18 0228 07/27/18 0148 07/28/18 0246 07/29/18 0204  NA 138 138 131* 131* 131*  K 3.6 4.3 4.1 4.6 3.9  CL 99 95* 92* 89* 90*  CO2 25 28 29 27 26   GLUCOSE 153* 124* 122* 252* 262*  BUN 19 21 17  28* 48*  CREATININE 0.94 0.95 0.89 1.06* 1.33*  CALCIUM 8.9 8.7* 8.4* 8.8* 8.9   GFR: Estimated Creatinine Clearance: 28.2 mL/min (A) (by C-G formula based on SCr of 1.33 mg/dL (H)). Liver Function Tests: Recent Labs  Lab 07/23/18 1212  AST 33  ALT 31  ALKPHOS 58  BILITOT 0.8  PROT 6.3*  ALBUMIN 2.9*   Recent Labs  Lab 07/27/18 1326  LIPASE 52*   No results for input(s): AMMONIA in the last 168 hours. Coagulation Profile: Recent Labs  Lab 07/23/18 1212  INR 1.12   Cardiac Enzymes: Recent Labs  Lab 07/27/18 1326  CKTOTAL 14*  CKMB 1.1   BNP (last 3 results) Recent Labs    05/12/18 1020  PROBNP 2,040*   HbA1C: No results for input(s): HGBA1C in the last 72 hours. CBG: Recent Labs  Lab 07/28/18 1201 07/28/18 1711 07/28/18 2115 07/29/18 0755 07/29/18 1158  GLUCAP 195* 182* 244* 260* 250*   Lipid Profile: No results for input(s): CHOL, HDL, LDLCALC,  TRIG, CHOLHDL, LDLDIRECT in the last 72 hours. Thyroid Function Tests: Recent Labs    07/28/18 0246  FREET4 0.94   Anemia Panel: No results for input(s): VITAMINB12, FOLATE, FERRITIN, TIBC, IRON, RETICCTPCT in the last 72 hours. Urine analysis:    Component Value Date/Time   COLORURINE YELLOW (A) 07/23/2018 1516   APPEARANCEUR CLEAR (A) 07/23/2018 1516   LABSPEC 1.020 07/23/2018 1516   PHURINE 7.0 07/23/2018 [place] 07/23/2018 1516   HGBUR NEGATIVE 07/23/2018 1516   BILIRUBINUR NEGATIVE 07/23/2018 [place] 07/23/2018 1516   PROTEINUR NEGATIVE 07/23/2018 1516   NITRITE NEGATIVE 07/23/2018 1516   LEUKOCYTESUR POSITIVE (A) 07/23/2018 1516  Sepsis Labs: @LABRCNTIP (procalcitonin:4,lacticidven:4) ) Recent Results (from the past 240 hour(s))  Respiratory Panel by PCR     Status: None   Collection Time: 07/27/18 12:58 PM  Result Value Ref Range Status   Adenovirus NOT DETECTED NOT DETECTED Final   Coronavirus 229E NOT DETECTED NOT DETECTED Final   Coronavirus HKU1 NOT DETECTED NOT DETECTED Final   Coronavirus NL63 NOT DETECTED NOT DETECTED Final   Coronavirus OC43 NOT DETECTED NOT DETECTED Final   Metapneumovirus NOT DETECTED NOT DETECTED Final   Rhinovirus / Enterovirus NOT DETECTED NOT DETECTED Final   Influenza A NOT DETECTED NOT DETECTED Final   Influenza B NOT DETECTED NOT DETECTED Final   Parainfluenza Virus 1 NOT DETECTED NOT DETECTED Final   Parainfluenza Virus 2 NOT DETECTED NOT DETECTED Final   Parainfluenza Virus 3 NOT DETECTED NOT DETECTED Final   Parainfluenza Virus 4 NOT DETECTED NOT DETECTED Final   Respiratory Syncytial Virus NOT DETECTED NOT DETECTED Final   Bordetella pertussis NOT DETECTED NOT DETECTED Final   Chlamydophila pneumoniae NOT DETECTED NOT DETECTED Final   Mycoplasma pneumoniae NOT DETECTED NOT DETECTED Final    Comment: Performed at [facility], [place] [address], [place], [place] [address]          Radiology Studies: Nm Tumor Localization W Spect  Result Date: 07/28/2018 CLINICAL DATA:  HEART FAILURE. CONCERN FOR CARDIAC AMYLOIDOSIS. EXAM: NUCLEAR MEDICINE TUMOR LOCALIZATION. PYP CARDIAC AMYLOIDOSIS SCAN WITH SPECT TECHNIQUE: Following intravenous administration of radiopharmaceutical, anterior planar images of the chest were obtained. Regions of interest were placed on the heart and contralateral chest wall for quantitative assessment. Additional SPECT imaging of the chest was obtained. RADIOPHARMACEUTICALS:  19.3 mCi TECHNETIUM 99 PYROPHOSPHATE FINDINGS: Planar Visual assessment: Anterior planar imaging demonstrates radiotracer uptake within the heart greater than uptake within the adjacent ribs (Grade 1). Quantitative assessment : Quantitative assessment of the cardiac uptake compared to the contralateral chest wall is equal to (H/CL = 1.14). SPECT assessment: SPECT imaging of the chest demonstrates equivocal radiotracer accumulation within the LEFT ventricle. IMPRESSION: Visual and quantitative assessment (grade 1, H/CLL equal 1.14) are equivocal for transthyretin amyloidosis. Of note: A negative or mildly positive PYP does not exclude AL amyloid. In addition, equivocal results could represent AL amyloid or early TTR amyloid Electronically Signed   By: Kerby Moors M.D.   On: 07/28/2018 17:32      Scheduled Meds: . escitalopram  20 mg Oral QHS  . feeding supplement (ENSURE ENLIVE)  237 mL Oral BID BM  . insulin aspart  0-5 Units Subcutaneous QHS  . insulin aspart  0-9 Units Subcutaneous TID WC  . iron polysaccharides  150 mg Oral Daily  . levothyroxine  75 mcg Oral QAC breakfast  . methylPREDNISolone (SOLU-MEDROL) injection  60 mg Intravenous Q6H  . metoprolol tartrate  12.5 mg Oral BID  . multivitamin with minerals  1 tablet Oral Daily  . pantoprazole  40 mg Oral Daily  . sodium phosphate  1 enema Rectal Once   Continuous Infusions: . heparin 1,100 Units/hr (07/29/18  0800)     LOS: 6 days    Time spent in minutes: 35    Debbe Odea, MD [facility] Pager: [email or web address] Password [email or web address] 07/29/2018, 1:28 PM

## 2018-07-29 NOTE — Plan of Care (Signed)
  Problem: Education: Goal: Knowledge of General Education information will improve Description Including pain rating scale, medication(s)/side effects and non-pharmacologic comfort measures Outcome: Progressing   Problem: Health Behavior/Discharge Planning: Goal: Ability to manage health-related needs will improve Outcome: Progressing   Problem: Clinical Measurements: Goal: Respiratory complications will improve Outcome: Progressing Goal: Cardiovascular complication will be avoided Outcome: Progressing   Problem: Nutrition: Goal: Adequate nutrition will be maintained Outcome: Progressing   Problem: Coping: Goal: Level of anxiety will decrease Outcome: Progressing   Problem: Elimination: Goal: Will not experience complications related to urinary retention Outcome: Progressing   Problem: Pain Managment: Goal: General experience of comfort will improve Outcome: Progressing

## 2018-07-29 NOTE — Progress Notes (Signed)
**Note Brooke-Identified via Obfuscation** Progress Note  Patient Name: Brooke Mullins Date of Encounter: 07/29/2018  Primary Cardiologist:   [name], MD   Subjective   She was SOB after her bath today but feels better now.   Inpatient Medications    Scheduled Meds: . escitalopram  20 mg Oral QHS  . feeding supplement (ENSURE ENLIVE)  237 mL Oral BID BM  . insulin aspart  0-5 Units Subcutaneous QHS  . insulin aspart  0-9 Units Subcutaneous TID WC  . iron polysaccharides  150 mg Oral Daily  . levothyroxine  75 mcg Oral QAC breakfast  . methylPREDNISolone (SOLU-MEDROL) injection  60 mg Intravenous Q6H  . metoprolol tartrate  12.5 mg Oral BID  . multivitamin with minerals  1 tablet Oral Daily  . pantoprazole  40 mg Oral Daily  . sodium phosphate  1 enema Rectal Once   Continuous Infusions: . heparin 1,100 Units/hr (07/28/18 1822)   PRN Meds: acetaminophen **OR** acetaminophen, albuterol, HYDROcodone-acetaminophen, morphine injection, polyethylene glycol, polyvinyl alcohol   Vital Signs    Vitals:   07/29/18 0546 07/29/18 0657 07/29/18 0700 07/29/18 0735  BP: (!) 142/67 (!) 148/79 (!) 148/79   Pulse: 83 70  76  Resp: 17 18  18   Temp:  98 F (36.7 C)    TempSrc:  Oral    SpO2: 97% 99%  97%  Weight:      Height:        Intake/Output Summary (Last 24 hours) at 07/29/2018 1019 Last data filed at 07/29/2018 0850 Gross per 24 hour  Intake 895 ml  Output 250 ml  Net 645 ml   Filed Weights   07/27/18 0554 07/28/18 0346 07/29/18 0500  Weight: 61.2 kg 58.9 kg 58.8 kg    Telemetry    NSR with PACs - Personally Reviewed  ECG    NA - Personally Reviewed  Physical Exam   GEN: No acute distress.   Neck: No  JVD Cardiac: RRR, no murmurs, rubs, or gallops.  Respiratory:   Decreased lung sounds with few basilar crackles GI: Soft, nontender, non-distended  MS: No  edema; No deformity. Neuro:  Nonfocal  Psych: Normal affect  Labs    Chemistry Recent Labs  Lab 07/23/18 1212  07/27/18 0148  07/28/18 0246 07/29/18 0204  NA 138   < > 131* 131* 131*  K 3.6   < > 4.1 4.6 3.9  CL 99   < > 92* 89* 90*  CO2 25   < > 29 27 26   GLUCOSE 153*   < > 122* 252* 262*  BUN 19   < > 17 28* 48*  CREATININE 0.94   < > 0.89 1.06* 1.33*  CALCIUM 8.9   < > 8.4* 8.8* 8.9  PROT 6.3*  --   --   --   --   ALBUMIN 2.9*  --   --   --   --   AST 33  --   --   --   --   ALT 31  --   --   --   --   ALKPHOS 58  --   --   --   --   BILITOT 0.8  --   --   --   --   GFRNONAA 56*   < > >60 49* 37*  GFRAA >60   < > >60 57* 43*  ANIONGAP 14   < > 10 15 15    < > = values in this interval  not displayed.     Hematology Recent Labs  Lab 07/27/18 0148 07/28/18 0246 07/29/18 0204  WBC 11.3* 8.0 10.8*  RBC 3.93 4.03 3.89  HGB 9.0* 9.4* 9.1*  HCT 29.4* 30.5* 29.2*  MCV 74.8* 75.7* 75.1*  MCH 22.9* 23.3* 23.4*  MCHC 30.6 30.8 31.2  RDW 22.5* 22.5* 22.2*  PLT 238 289 324    Cardiac EnzymesNo results for input(s): TROPONINI in the last 168 hours.  Recent Labs  Lab 07/23/18 1219  TROPIPOC 0.03     BNP Recent Labs  Lab 07/26/18 1653  BNP 1,180.8*     DDimer No results for input(s): DDIMER in the last 168 hours.   Radiology    Nm Tumor Localization W Spect  Result Date: 07/28/2018 CLINICAL DATA:  HEART FAILURE. CONCERN FOR CARDIAC AMYLOIDOSIS. EXAM: NUCLEAR MEDICINE TUMOR LOCALIZATION. PYP CARDIAC AMYLOIDOSIS SCAN WITH SPECT TECHNIQUE: Following intravenous administration of radiopharmaceutical, anterior planar images of the chest were obtained. Regions of interest were placed on the heart and contralateral chest wall for quantitative assessment. Additional SPECT imaging of the chest was obtained. RADIOPHARMACEUTICALS:  19.3 mCi TECHNETIUM 99 PYROPHOSPHATE FINDINGS: Planar Visual assessment: Anterior planar imaging demonstrates radiotracer uptake within the heart greater than uptake within the adjacent ribs (Grade 1). Quantitative assessment : Quantitative assessment of the cardiac uptake  compared to the contralateral chest wall is equal to (H/CL = 1.14). SPECT assessment: SPECT imaging of the chest demonstrates equivocal radiotracer accumulation within the LEFT ventricle. IMPRESSION: Visual and quantitative assessment (grade 1, H/CLL equal 1.14) are equivocal for transthyretin amyloidosis. Of note: A negative or mildly positive PYP does not exclude AL amyloid. In addition, equivocal results could represent AL amyloid or early TTR amyloid Electronically Signed   By: Kerby Moors M.D.   On: 07/28/2018 17:32    Cardiac Studies   ECHO  Study Conclusions  - Left ventricle: The cavity size was normal. There was severe   concentric hypertrophy. Systolic function was vigorous. The   estimated ejection fraction was in the range of 65% to 70%. Wall   motion was normal; there were no regional wall motion   abnormalities. The study is not technically sufficient to allow   evaluation of LV diastolic function. - Aortic valve: Sclerosis without stenosis. There was trivial   regurgitation. - Mitral valve: Calcified annulus. Mildly thickened leaflets .   There was trivial regurgitation. - Left atrium: Moderately dilated. - Right ventricle: The cavity size was mildly dilated. Mildly   reduced systolic function. - Tricuspid valve: There was moderate regurgitation. - Pulmonary arteries: PA peak pressure: 59 mm Hg (S). - Inferior vena cava: The vessel was normal in size. The   respirophasic diameter changes were in the normal range (>= 50%),   consistent with normal central venous pressure.  Patient Profile     84 y.o. female with a very complex past medical history that include persistent atrial fibrillation, AAD therapy w/ Amiodarone, evidence of left ventricle hypertrophy with some suspicion for infiltrative cardiomyopathy, chronic anticoagulation, history of nosebleeding with maxillary artery embolization, mechanical falls, carotid artery disease with 100% occluded right carotid,  diabetes, hyperlipidemia, hypertension, chronic respiratory failure on home O2 and recent diagnosis of IPF, who is being seen for worsening dyspnea and CHF, at the request of Dr. Wynelle Cleveland, Internal Medicine.   Assessment & Plan    ATRIAL FIB:  Off of amio as below.  Eliquis held for cath.  On heparin.    Now back in NSR.  No change in  therapy today.      ACUTE ON CHRONIC DIASTOLIC HF:    Lasix held yesterday.  TTR amyloid scan equivocal for amyloidosis.  However, I reviewed the images with Dr. Haroldine Laws and this was negative for amyloid.   Right and left heart cath planned for Monday.  Negative 1.8 liters.     RESPIRATORY FAILURE:  Thought possibly to be related to amiodarone which has been stopped.   Oxygenation stable.  On steroids and bronchodilators.    HYPONATREMIA:   Stable.  Follow  CKD II:  Creat is up slightly.  Follow.  Off of Lasix.   No LV gram.    HTN:  BP mildly elevated.   Consider increased beta blocker in AM if BP is still elevated.     For questions or updates, please contact [place] Please consult [email or web address] for contact info under Cardiology/STEMI.   Signed, Minus Breeding, MD  07/29/2018, 10:19 AM

## 2018-07-29 NOTE — Progress Notes (Signed)
NAME:  Brooke Mullins, MRN:  [id number], DOB:  1934/02/01, LOS: 6 ADMISSION DATE:  07/23/2018, CONSULTATION DATE:  07/27/18 REFERRING MD:  Brooke Mullins  CHIEF COMPLAINT:  SOB   Brief History   Brooke Mullins is a 84 y.o. female who was admitted 12/22 after fall.  Found to have right hip fx's that are being managed conservatively. PCCM called 12/26 due to high FiO2 needs due to possible underlying IPF (see discussion in HPI regarding IPF diagnosis).  Past Medical History  IPF on 3L O2, A.fib, subclavian artery stenosis, HTN, HLD, CAD, PSVT, PVD, depression, hypothyroidism.  Significant Hospital Events   12/22 > admit. 12/26 > PCCM consult. Steroids increased.  RVP - negative, Urine strep negzativeSed rate 70s and RF mildly elevated . Rest negative. HP panel pending 12/27: negative 2.4 liters. . ILD questionnaire - Amio intake +, Engineer, manufacturing systems +. Mild mildew/mold exppsure in bathroom in 82 year old home, prior smoker  Consults:  Brooke Mullins. PCCM.  Procedures:  None.  Significant Diagnostic Tests:  CT chest 12/25 > Diffuse GGO's with bronchiectasis.  New compression fx of T4 vertebral body.  Micro Data:  Sputum 12/26 >   Antimicrobials:  None.   Interim history/subjective:   12/28 - on solumedrol '60mg'$  Q6h. Remains on HFNC 75%,. Easy desats into the 70s with movement. Not using night bipap   Objective:  Blood pressure (!) 159/75, pulse 79, temperature 98 F (36.7 C), temperature source Oral, resp. rate (!) 28, height '5\' 4"'$  (1.626 m), weight 58.8 kg, SpO2 95 %.    FiO2 (%):  [65 %-80 %] 75 %   Intake/Output Summary (Last 24 hours) at 07/29/2018 1527 Last data filed at 07/29/2018 1456 Gross per 24 hour  Intake 859 ml  Output 350 ml  Net 509 ml   Filed Weights   07/27/18 0554 07/28/18 0346 07/29/18 0500  Weight: 61.2 kg 58.9 kg 58.8 kg    Examination: General Appearance:  Looks deconditioned, frail but calm and stable Head:  Normocephalic, without obvious abnormality,  atraumatic Eyes:  PERRL - yes, conjunctiva/corneas - clear     Ears:  Normal external ear canals, both ears Nose:  G tube - no but has HFNC Throat:  ETT TUBE - no , OG tube - no Neck:  Supple,  No enlargement/tenderness/nodules Lungs: No distress but has crackles Heart:  S1 and S2 normal, no murmur, CVP - no.  Pressors - no Abdomen:  Soft, no masses, no organomegaly Genitalia / Rectal:  Not done Extremities:  Extremities- intact Skin:  ntact in exposed areas . Sacral area - not eaminex Neurologic:  Sedation - none -> RASS - +1 . Moves all 4s - yes. CAM-ICU - neg . Orientation - x3+      Assessment & Plan:  Acute on chronic hypoxic resp failure w/ pulmonary infiltrates.  - dx is most likely Acute Amio lung toxicitity (BOOP) - based on ggo with high esr and post amio findings  - ddx is UIP flare (undiagnosed UIP) and subacute hypersensitivity pneumonitis  12/28 - unimproved   PLAN  - increase IV steroids to 1g/day (patient and sister warned of risks but no choice at this point) x 48h and then tape over 8-16 weeks to off  - no amiodarone  - o2 for pulse ox > 88% - likely needs LTAC  - await HP panel (can be helpful if positive)  - no bronch (too high risk) - get records from Brooke Mullins during week - no role  for Ofev/esbriet (patient has this ordered through Brooke Mullins) for now - can follow in [facility] ILD clinic with Brooke Mullins   PCCM will see again Monday 07/21/18   Best Practice:  Diet: Regular Pain/Anxiety/Delirium protocol (if indicated): N/A. VAP protocol (if indicated): N/A. DVT prophylaxis: SCD's / apixaban. GI prophylaxis: Pantoprazole. Glucose control: N/A. Mobility: Bedrest. Code Status: DNR. Family Communication: patient and sister Disposition: Progressive Care. [place] LTACT      SIGNATURE    Brooke Mullins, M.D., F.C.C.P,  Pulmonary and Critical Care Medicine Staff Physician, [place] Director - Interstitial Lung Disease   Program  Pulmonary [place] at [place], [place], [address]  Pager: [phone], If no answer or between  15:00h - 7:00h: call [phone] Telephone: [phone]  3:34 PM 07/29/2018

## 2018-07-29 NOTE — Progress Notes (Signed)
ANTICOAGULATION CONSULT NOTE - Follow Up Consult  Pharmacy Consult for IV heparin (Eliquis on hold) Indication: atrial fibrillation  Allergies  Allergen Reactions  . Penicillins Anaphylaxis and Other (See Comments)    Has patient had a PCN reaction causing immediate rash, facial/tongue/throat swelling, SOB or lightheadedness with hypotension: Yes Has patient had a PCN reaction causing severe rash involving mucus membranes or skin necrosis: No Has patient had a PCN reaction that required hospitalization: No Has patient had a PCN reaction occurring within the last 10 years: No If all of the above answers are "NO", then may proceed with Cephalosporin use.   . Atorvastatin Other (See Comments)    Muscle pain  . Codeine Other (See Comments)    Nausea/Vomiting  . Colesevelam Other (See Comments)    Unsure of exact reaction type  . Ezetimibe Other (See Comments)    Pain in legs    Patient Measurements: Height: 5\' 4"  (162.6 cm) Weight: 129 lb 10.1 oz (58.8 kg) IBW/kg (Calculated) : 54.7 Heparin Dosing Weight: 56.7 kg  Vital Signs: Temp: 98 F (36.7 C) (12/28 0657) Temp Source: Oral (12/28 0657) BP: 148/79 (12/28 0700) Pulse Rate: 76 (12/28 0735)  Labs: Recent Labs    07/27/18 0148 07/27/18 1326  07/28/18 0246  07/28/18 1255 07/28/18 2309 07/29/18 0204  HGB 9.0*  --   --  9.4*  --   --   --  9.1*  HCT 29.4*  --   --  30.5*  --   --   --  29.2*  PLT 238  --   --  289  --   --   --  324  APTT  --   --    < > 44*   < > 40* 99* 84*  HEPARINUNFRC  --   --    < > 1.16*  --  1.06*  --  1.18*  CREATININE 0.89  --   --  1.06*  --   --   --  1.33*  CKTOTAL  --  14*  --   --   --   --   --   --   CKMB  --  1.1  --   --   --   --   --   --    < > = values in this interval not displayed.    Estimated Creatinine Clearance: 28.2 mL/min (A) (by C-G formula based on SCr of 1.33 mg/dL (H)).   Medical History: Past Medical History:  Diagnosis Date  . Atrial fibrillation ([place])   .  Carotid artery stenosis with cerebral infarction ([place])   . Carotid atherosclerosis, bilateral   . Chronic bronchitis ([place])   . Depression   . Dyslipidemia   . Edema   . History of blood transfusion 04/24/2018   "low HgB" (04/25/2018)  . Hyperplastic colon polyp   . Hypertension   . Hypothyroidism   . Osteoporosis   . Peripheral vascular disease of extremity ([place])   . PSVT (paroxysmal supraventricular tachycardia) ([place])   . Skin cancer    "scraped off my neck and legs" (04/25/2018)  . Subclavian artery stenosis ([facility])     Medications:  Infusions:  . heparin 1,100 Units/hr (07/28/18 1822)    Assessment: 84 yo on chronic Eliquis for afib, currently held for [facility] next week.  Has not had Eliquis dose since 12/25 AM since on bipap and unable to take po.   Patient is currently on IV heparin. APTT this morning  remains therapeutic at 84 on 1100 units/hr. HL remains falsely elevated at 1.18. Hb low, but stable. No signs/symptoms of bleeding or issues with infusion reported per RN.   Goal of Therapy:  Heparin level 0.3-0.7 units/ml  APTT 66-102s Monitor platelets by anticoagulation protocol: Yes   Plan:  1. Continue IV heparin to 1100 units/hr  2. Daily aPTT, heparin level and CBC. 3. F/u plans for anticoagulation after Monday.  Claiborne Billings, PharmD PGY2 Cardiology Pharmacy Resident Phone [phone] Please check AMION for all Pharmacist numbers by unit 07/29/2018 8:14 AM

## 2018-07-30 LAB — BASIC METABOLIC PANEL
Anion gap: 12 (ref 5–15)
BUN: 46 mg/dL — ABNORMAL HIGH (ref 8–23)
CO2: 28 mmol/L (ref 22–32)
CREATININE: 1.25 mg/dL — AB (ref 0.44–1.00)
Calcium: 8.6 mg/dL — ABNORMAL LOW (ref 8.9–10.3)
Chloride: 93 mmol/L — ABNORMAL LOW (ref 98–111)
GFR calc Af Amer: 46 mL/min — ABNORMAL LOW (ref >60)
GFR calc non Af Amer: 40 mL/min — ABNORMAL LOW (ref >60)
Glucose, Bld: 237 mg/dL — ABNORMAL HIGH (ref 70–99)
Potassium: 4.1 mmol/L (ref 3.5–5.1)
Sodium: 133 mmol/L — ABNORMAL LOW (ref 135–145)

## 2018-07-30 LAB — GLUCOSE, CAPILLARY
GLUCOSE-CAPILLARY: 241 mg/dL — AB (ref 70–99)
GLUCOSE-CAPILLARY: 249 mg/dL — AB (ref 70–99)
Glucose-Capillary: 262 mg/dL — ABNORMAL HIGH (ref 70–99)
Glucose-Capillary: 319 mg/dL — ABNORMAL HIGH (ref 70–99)

## 2018-07-30 LAB — CBC
HCT: 28.4 % — ABNORMAL LOW (ref 36.0–46.0)
Hemoglobin: 8.8 g/dL — ABNORMAL LOW (ref 12.0–15.0)
MCH: 23.3 pg — ABNORMAL LOW (ref 26.0–34.0)
MCHC: 31 g/dL (ref 30.0–36.0)
MCV: 75.1 fL — ABNORMAL LOW (ref 80.0–100.0)
NRBC: 0 % (ref 0.0–0.2)
Platelets: 313 10*3/uL (ref 150–400)
RBC: 3.78 MIL/uL — ABNORMAL LOW (ref 3.87–5.11)
RDW: 21.7 % — ABNORMAL HIGH (ref 11.5–15.5)
WBC: 8.2 10*3/uL (ref 4.0–10.5)

## 2018-07-30 LAB — APTT
APTT: 116 s — AB (ref 24–36)
aPTT: 104 seconds — ABNORMAL HIGH (ref 24–36)
aPTT: 113 seconds — ABNORMAL HIGH (ref 24–36)

## 2018-07-30 LAB — HEPARIN LEVEL (UNFRACTIONATED): Heparin Unfractionated: 1.34 IU/mL — ABNORMAL HIGH (ref 0.30–0.70)

## 2018-07-30 LAB — LEGIONELLA PNEUMOPHILA SEROGP 1 UR AG: L. pneumophila Serogp 1 Ur Ag: NEGATIVE

## 2018-07-30 MED ORDER — SODIUM CHLORIDE 0.9 % IV SOLN
INTRAVENOUS | Status: DC
  Administered 2018-07-31: 06:00:00 via INTRAVENOUS

## 2018-07-30 MED ORDER — FLEET ENEMA 7-19 GM/118ML RE ENEM
1.0000 | ENEMA | Freq: Once | RECTAL | Status: AC
  Administered 2018-07-30: 1 via RECTAL
  Filled 2018-07-30: qty 1

## 2018-07-30 MED ORDER — ORAL CARE MOUTH RINSE
15.0000 mL | Freq: Two times a day (BID) | OROMUCOSAL | Status: DC
  Administered 2018-07-30 – 2018-08-04 (×7): 15 mL via OROMUCOSAL

## 2018-07-30 MED ORDER — SODIUM CHLORIDE 0.9 % IV SOLN
250.0000 mL | INTRAVENOUS | Status: DC | PRN
  Administered 2018-07-30: 250 mL via INTRAVENOUS

## 2018-07-30 MED ORDER — ASPIRIN 81 MG PO CHEW
81.0000 mg | CHEWABLE_TABLET | ORAL | Status: AC
  Administered 2018-07-31: 81 mg via ORAL
  Filled 2018-07-30: qty 1

## 2018-07-30 MED ORDER — SODIUM CHLORIDE 0.9% FLUSH
3.0000 mL | Freq: Two times a day (BID) | INTRAVENOUS | Status: DC
  Administered 2018-07-30 – 2018-07-31 (×2): 3 mL via INTRAVENOUS

## 2018-07-30 MED ORDER — SODIUM CHLORIDE 0.9% FLUSH: 3.0000 mL | INTRAVENOUS | Status: DC | PRN

## 2018-07-30 NOTE — Progress Notes (Signed)
Progress Note  Patient Name: [name] Date of Encounter: 07/30/2018  Primary Cardiologist:   [name], MD   Subjective   Chronic SOB no change no acute complaints.  Inpatient Medications    Scheduled Meds: . escitalopram  20 mg Oral QHS  . feeding supplement (ENSURE ENLIVE)  237 mL Oral BID BM  . insulin aspart  0-5 Units Subcutaneous QHS  . insulin aspart  0-9 Units Subcutaneous TID WC  . iron polysaccharides  150 mg Oral Daily  . levothyroxine  75 mcg Oral QAC breakfast  . metoprolol tartrate  12.5 mg Oral BID  . multivitamin with minerals  1 tablet Oral Daily  . pantoprazole  40 mg Oral Daily  . polyethylene glycol  17 g Oral Daily  . sodium phosphate  1 enema Rectal Once   Continuous Infusions: . heparin 1,100 Units/hr (07/29/18 1801)  . methylPREDNISolone (SOLU-MEDROL) injection 250 mg (07/30/18 0445)   PRN Meds: acetaminophen **OR** acetaminophen, albuterol, HYDROcodone-acetaminophen, morphine injection, polyethylene glycol, polyvinyl alcohol   Vital Signs    Vitals:   07/29/18 2335 07/30/18 0356 07/30/18 0751 07/30/18 0805  BP: (!) 146/84 (!) 129/95 (!) 129/95 115/79  Pulse: 65 (!) 59 79 61  Resp: 16 19 20  (!) 21  Temp: (!) 97.5 F (36.4 C) (!) 97.5 F (36.4 C)  98.8 F (37.1 C)  TempSrc: Oral Oral  Oral  SpO2: 98% 97% 97% 95%  Weight:  59.5 kg    Height:        Intake/Output Summary (Last 24 hours) at 07/30/2018 1044 Last data filed at 07/30/2018 0808 Gross per 24 hour  Intake 532 ml  Output 150 ml  Net 382 ml   Filed Weights   07/28/18 0346 07/29/18 0500 07/30/18 0356  Weight: 58.9 kg 58.8 kg 59.5 kg    Telemetry    NSR, blocked PACs, Mobitz 1, blocked PACs in a bigeminal pattern - Personally Reviewed  ECG    NA - Personally Reviewed  Physical Exam   GEN: No  acute distress.   Neck: No  JVD Cardiac: RRR, no murmurs, rubs, or gallops.  Respiratory:    Basilar fine crackles.  GI: Soft, nontender, non-distended, normal  bowel sounds  MS:  Trace edema; No deformity. Neuro:   Nonfocal  Psych: Oriented and appropriate    Labs    Chemistry Recent Labs  Lab 07/23/18 1212  07/28/18 0246 07/29/18 0204 07/30/18 0329  NA 138   < > 131* 131* 133*  K 3.6   < > 4.6 3.9 4.1  CL 99   < > 89* 90* 93*  CO2 25   < > 27 26 28   GLUCOSE 153*   < > 252* 262* 237*  BUN 19   < > 28* 48* 46*  CREATININE 0.94   < > 1.06* 1.33* 1.25*  CALCIUM 8.9   < > 8.8* 8.9 8.6*  PROT 6.3*  --   --   --   --   ALBUMIN 2.9*  --   --   --   --   AST 33  --   --   --   --   ALT 31  --   --   --   --   ALKPHOS 58  --   --   --   --   BILITOT 0.8  --   --   --   --   GFRNONAA 56*   < > 49* 37* 40*  GFRAA >60   < > 57* 43* 46*  ANIONGAP 14   < > 15 15 12    < > = values in this interval not displayed.     Hematology Recent Labs  Lab 07/28/18 0246 07/29/18 0204 07/30/18 0329  WBC 8.0 10.8* 8.2  RBC 4.03 3.89 3.78*  HGB 9.4* 9.1* 8.8*  HCT 30.5* 29.2* 28.4*  MCV 75.7* 75.1* 75.1*  MCH 23.3* 23.4* 23.3*  MCHC 30.8 31.2 31.0  RDW 22.5* 22.2* 21.7*  PLT 289 324 313    Cardiac EnzymesNo results for input(s): TROPONINI in the last 168 hours.  Recent Labs  Lab 07/23/18 1219  TROPIPOC 0.03     BNP Recent Labs  Lab 07/26/18 1653  BNP 1,180.8*     DDimer No results for input(s): DDIMER in the last 168 hours.   Radiology    Nm Tumor Localization W Spect  Result Date: 07/28/2018 CLINICAL DATA:  HEART FAILURE. CONCERN FOR CARDIAC AMYLOIDOSIS. EXAM: NUCLEAR MEDICINE TUMOR LOCALIZATION. PYP CARDIAC AMYLOIDOSIS SCAN WITH SPECT TECHNIQUE: Following intravenous administration of radiopharmaceutical, anterior planar images of the chest were obtained. Regions of interest were placed on the heart and contralateral chest wall for quantitative assessment. Additional SPECT imaging of the chest was obtained. RADIOPHARMACEUTICALS:  19.3 mCi TECHNETIUM 99 PYROPHOSPHATE FINDINGS: Planar Visual assessment: Anterior planar imaging  demonstrates radiotracer uptake within the heart greater than uptake within the adjacent ribs (Grade 1). Quantitative assessment : Quantitative assessment of the cardiac uptake compared to the contralateral chest wall is equal to (H/CL = 1.14). SPECT assessment: SPECT imaging of the chest demonstrates equivocal radiotracer accumulation within the LEFT ventricle. IMPRESSION: Visual and quantitative assessment (grade 1, H/CLL equal 1.14) are equivocal for transthyretin amyloidosis. Of note: A negative or mildly positive PYP does not exclude AL amyloid. In addition, equivocal results could represent AL amyloid or early TTR amyloid Electronically Signed   By: Kerby Moors M.D.   On: 07/28/2018 17:32    Cardiac Studies   ECHO  Study Conclusions  - Left ventricle: The cavity size was normal. There was severe   concentric hypertrophy. Systolic function was vigorous. The   estimated ejection fraction was in the range of 65% to 70%. Wall   motion was normal; there were no regional wall motion   abnormalities. The study is not technically sufficient to allow   evaluation of LV diastolic function. - Aortic valve: Sclerosis without stenosis. There was trivial   regurgitation. - Mitral valve: Calcified annulus. Mildly thickened leaflets .   There was trivial regurgitation. - Left atrium: Moderately dilated. - Right ventricle: The cavity size was mildly dilated. Mildly   reduced systolic function. - Tricuspid valve: There was moderate regurgitation. - Pulmonary arteries: PA peak pressure: 59 mm Hg (S). - Inferior vena cava: The vessel was normal in size. The   respirophasic diameter changes were in the normal range (>= 50%),   consistent with normal central venous pressure.  Patient Profile     84 y.o. female with a very complex past medical history that include persistent atrial fibrillation, AAD therapy w/ Amiodarone, evidence of left ventricle hypertrophy with some suspicion for infiltrative  cardiomyopathy, chronic anticoagulation, history of nosebleeding with maxillary artery embolization, mechanical falls, carotid artery disease with 100% occluded right carotid, diabetes, hyperlipidemia, hypertension, chronic respiratory failure on home O2 and recent diagnosis of IPF, who is being seen for worsening dyspnea and CHF, at the request of Dr. Wynelle Cleveland, Internal Medicine.   Assessment & Plan  ATRIAL FIB:  NSR.  Off of amio as below.  Eliquis held for cath.  On heparin.      ACUTE ON CHRONIC DIASTOLIC HF:    TTR amyloid scan negative.  On for right and left cath in the AM.  Lasix being held pending the cath.      RESPIRATORY FAILURE:  Thought possibly to be related to amiodarone which has been stopped.   Oxygenation stable.  On steroids and bronchodilators per pulmonary.    Marland Kitchen   ANEMIA:  Stable on Heparin. Continue to follow.   HYPONATREMIA:   Improved.  Follow  CKD II:  Creat is improved. No LV gram during the cath.    HTN:  BP is better today.  Continue current therapy.    For questions or updates, please contact [place] Please consult [email or web address] for contact info under Cardiology/STEMI.   Signed, Minus Breeding, MD  07/30/2018, 10:44 AM

## 2018-07-30 NOTE — Plan of Care (Signed)
  Problem: Elimination: Goal: Will not experience complications related to bowel motility Outcome: Not Progressing Note:  Last BM 07/22/2018; see MAR for interventions.   Problem: Education: Goal: Knowledge of General Education information will improve Description Including pain rating scale, medication(s)/side effects and non-pharmacologic comfort measures Outcome: Progressing   Problem: Clinical Measurements: Goal: Ability to maintain clinical measurements within normal limits will improve Outcome: Progressing Goal: Cardiovascular complication will be avoided Outcome: Progressing   Problem: Nutrition: Goal: Adequate nutrition will be maintained Outcome: Progressing   Problem: Coping: Goal: Level of anxiety will decrease Outcome: Progressing   Problem: Elimination: Goal: Will not experience complications related to urinary retention Outcome: Progressing   Problem: Pain Managment: Goal: General experience of comfort will improve Outcome: Progressing

## 2018-07-30 NOTE — Progress Notes (Signed)
PROGRESS NOTE    Brooke Mullins   IDP:[id number]  DOB: 1934-02-28  DOA: 07/23/2018 PCP: Park Liter, MD   Brief Narrative:  [name] 84 year old female with history of recently diagnosed pulmonary fibrosis chronic hypoxic respiratory failure on 2 L nasal cannula just prior to admission, persistent A- fib, secondary pulm HTN, PVD came to the ER after fall at home.    Admitted from 04/25/18- 04/28/18 to the cardiology service (as a transfer from [place]) for dyspnea with a productive cough, hypoxia needing a BiPAP. She was diuresed and was weaned down to about 3-4 l O2 when discharged.  She followed up later with a pulmonologist in [place], Dr Bernette Mayers, underwent a lung biopsy and was diagnosed with IPF and started on 20 mg of Prednisone daily. She was also prescribed another medications which they are trying to get approved through her insurance.  She had noted more shortness of breath the day before the fall that brought her to the ED.   In the ER, she was found to have right pelvic fracture, was admitted for pain control & nonoperative management. H and P notes that her pulse ox was 81% the day before.  I started to see her on 12/25 and noted she was on 100% FiO2 via a Face mask. It was noted the night before the she had become more hypoxic. A dose of IV Solumedrol and one of IV Lasix were given. CXR showed interstitial pulmonary edema.  Subjective: No complaints.no BM yet. For some reason, enema was not given. No other complaints.     Assessment & Plan:   Principal Problem:   Acute on chronic respiratory failure with hypoxia  H/o pulmonary fibrosis on Prednisone - last admitted in Sept with acute resp failure from CHF, ECHO showed severe concentric LV wall thickening with speckled intraventricular septum suggestive of infiltrative cardiomyopathy such as amyloidosis.  - CXR suggestive of pulm edema- given multiple doses of Lasix   - 12/25- not improving- checked chest CT-  showed b/l ground glass infiltrates- checked pro calcitonin which was negative - transfered to SDU for BiPAP on 12/25- given further Lasix    - 12/26 - not improving- cardiology and pulmonary consults requested- suspecting Amio induced pulmonary toxicity - Amio stopped and IV steroids started by Pulm-   Active Problems:     Persistent atrial fibrillation - cont Metoprolol, Amio, Heparin infusion   Mild AKI   - baseline Cr ~ 0.8-0.9- Cr currently 1.33 with elevated BUN likely a bit dry due to lasix- - Cr better today - follow urine output    Infiltrative cardiomyopathy?-   chronic diastolic CHF/ secondary pulm HTN - see above in regards to diuretics - cardiology following - TTR Amyloid scan neg for amyloid per Cardiology - right and left heart cath planned for Monday    Pelvic fracture/   Fall at home, initial encounter - pain control    Hypothyroidism - Synthroid  Constipated - Dulcolax did not help- did not get enema- started Miralax daily- per patient, she will get enema today  DVT prophylaxis: Eliquis Code Status: DNR Family Communication: daughter and son Disposition Plan:  Follow in SDU Consultants:   PCCM  Cardiology Procedures:   none Antimicrobials:  Anti-infectives (From admission, onward)   None       Objective: Vitals:   07/29/18 2335 07/30/18 0356 07/30/18 0751 07/30/18 0805  BP: (!) 146/84 (!) 129/95 (!) 129/95 115/79  Pulse: 65 (!) 59 79 61  Resp:  16 19 20  (!) 21  Temp: (!) 97.5 F (36.4 C) (!) 97.5 F (36.4 C)  98.8 F (37.1 C)  TempSrc: Oral Oral  Oral  SpO2: 98% 97% 97% 95%  Weight:  59.5 kg    Height:        Intake/Output Summary (Last 24 hours) at 07/30/2018 1119 Last data filed at 07/30/2018 3300 Gross per 24 hour  Intake 532 ml  Output 150 ml  Net 382 ml   Filed Weights   07/28/18 0346 07/29/18 0500 07/30/18 0356  Weight: 58.9 kg 58.8 kg 59.5 kg    Examination: General exam: Appears comfortable  HEENT: PERRLA, oral mucosa  moist, no sclera icterus or thrush Respiratory system:   - b/l crackles noted- no respiratory distress Cardiovascular system: S1 & S2 heard,  No murmurs  Gastrointestinal system: Abdomen soft, slightly distended today- non tender,  - Normal bowel sounds.   Central nervous system: Alert and oriented. No focal neurological deficits. Extremities: No cyanosis, clubbing or edema Skin: No rashes or ulcers Psychiatry:  Mood & affect appropriate.     Data Reviewed: I have personally reviewed following labs and imaging studies  CBC: Recent Labs  Lab 07/23/18 1212 07/24/18 0228 07/27/18 0148 07/28/18 0246 07/29/18 0204 07/30/18 0329  WBC 13.0* 8.7 11.3* 8.0 10.8* 8.2  NEUTROABS 9.8*  --   --   --   --   --   HGB 9.9* 9.0* 9.0* 9.4* 9.1* 8.8*  HCT 33.5* 28.2* 29.4* 30.5* 29.2* 28.4*  MCV 77.7* 75.2* 74.8* 75.7* 75.1* 75.1*  PLT 284 260 238 289 324 762   Basic Metabolic Panel: Recent Labs  Lab 07/24/18 0228 07/27/18 0148 07/28/18 0246 07/29/18 0204 07/30/18 0329  NA 138 131* 131* 131* 133*  K 4.3 4.1 4.6 3.9 4.1  CL 95* 92* 89* 90* 93*  CO2 28 29 27 26 28   GLUCOSE 124* 122* 252* 262* 237*  BUN 21 17 28* 48* 46*  CREATININE 0.95 0.89 1.06* 1.33* 1.25*  CALCIUM 8.7* 8.4* 8.8* 8.9 8.6*   GFR: Estimated Creatinine Clearance: 30 mL/min (A) (by C-G formula based on SCr of 1.25 mg/dL (H)). Liver Function Tests: Recent Labs  Lab 07/23/18 1212  AST 33  ALT 31  ALKPHOS 58  BILITOT 0.8  PROT 6.3*  ALBUMIN 2.9*   Recent Labs  Lab 07/27/18 1326  LIPASE 52*   No results for input(s): AMMONIA in the last 168 hours. Coagulation Profile: Recent Labs  Lab 07/23/18 1212  INR 1.12   Cardiac Enzymes: Recent Labs  Lab 07/27/18 1326  CKTOTAL 14*  CKMB 1.1   BNP (last 3 results) Recent Labs    05/12/18 1020  PROBNP 2,040*   HbA1C: No results for input(s): HGBA1C in the last 72 hours. CBG: Recent Labs  Lab 07/29/18 0755 07/29/18 1158 07/29/18 1656 07/29/18 2206  07/30/18 0805  GLUCAP 260* 250* 229* 327* 241*   Lipid Profile: No results for input(s): CHOL, HDL, LDLCALC, TRIG, CHOLHDL, LDLDIRECT in the last 72 hours. Thyroid Function Tests: Recent Labs    07/28/18 0246  FREET4 0.94   Anemia Panel: No results for input(s): VITAMINB12, FOLATE, FERRITIN, TIBC, IRON, RETICCTPCT in the last 72 hours. Urine analysis:    Component Value Date/Time   COLORURINE YELLOW (A) 07/23/2018 1516   APPEARANCEUR CLEAR (A) 07/23/2018 1516   LABSPEC 1.020 07/23/2018 1516   PHURINE 7.0 07/23/2018 1516   GLUCOSEU NEGATIVE 07/23/2018 1516   HGBUR NEGATIVE 07/23/2018 1516   BILIRUBINUR NEGATIVE 07/23/2018  [place] 07/23/2018 [place] 07/23/2018 1516   NITRITE NEGATIVE 07/23/2018 1516   LEUKOCYTESUR POSITIVE (A) 07/23/2018 1516   Sepsis Labs: @LABRCNTIP (procalcitonin:4,lacticidven:4) ) Recent Results (from the past 240 hour(s))  Respiratory Panel by PCR     Status: None   Collection Time: 07/27/18 12:58 PM  Result Value Ref Range Status   Adenovirus NOT DETECTED NOT DETECTED Final   Coronavirus 229E NOT DETECTED NOT DETECTED Final   Coronavirus HKU1 NOT DETECTED NOT DETECTED Final   Coronavirus NL63 NOT DETECTED NOT DETECTED Final   Coronavirus OC43 NOT DETECTED NOT DETECTED Final   Metapneumovirus NOT DETECTED NOT DETECTED Final   Rhinovirus / Enterovirus NOT DETECTED NOT DETECTED Final   Influenza A NOT DETECTED NOT DETECTED Final   Influenza B NOT DETECTED NOT DETECTED Final   Parainfluenza Virus 1 NOT DETECTED NOT DETECTED Final   Parainfluenza Virus 2 NOT DETECTED NOT DETECTED Final   Parainfluenza Virus 3 NOT DETECTED NOT DETECTED Final   Parainfluenza Virus 4 NOT DETECTED NOT DETECTED Final   Respiratory Syncytial Virus NOT DETECTED NOT DETECTED Final   Bordetella pertussis NOT DETECTED NOT DETECTED Final   Chlamydophila pneumoniae NOT DETECTED NOT DETECTED Final   Mycoplasma pneumoniae NOT DETECTED NOT  DETECTED Final    Comment: Performed at [facility], [place] [address] [place], [place] [address]         Radiology Studies: Nm Tumor Localization W Spect  Result Date: 07/28/2018 CLINICAL DATA:  HEART FAILURE. CONCERN FOR CARDIAC AMYLOIDOSIS. EXAM: NUCLEAR MEDICINE TUMOR LOCALIZATION. PYP CARDIAC AMYLOIDOSIS SCAN WITH SPECT TECHNIQUE: Following intravenous administration of radiopharmaceutical, anterior planar images of the chest were obtained. Regions of interest were placed on the heart and contralateral chest wall for quantitative assessment. Additional SPECT imaging of the chest was obtained. RADIOPHARMACEUTICALS:  19.3 mCi TECHNETIUM 99 PYROPHOSPHATE FINDINGS: Planar Visual assessment: Anterior planar imaging demonstrates radiotracer uptake within the heart greater than uptake within the adjacent ribs (Grade 1). Quantitative assessment : Quantitative assessment of the cardiac uptake compared to the contralateral chest wall is equal to (H/CL = 1.14). SPECT assessment: SPECT imaging of the chest demonstrates equivocal radiotracer accumulation within the LEFT ventricle. IMPRESSION: Visual and quantitative assessment (grade 1, H/CLL equal 1.14) are equivocal for transthyretin amyloidosis. Of note: A negative or mildly positive PYP does not exclude AL amyloid. In addition, equivocal results could represent AL amyloid or early TTR amyloid Electronically Signed   By: Kerby Moors M.D.   On: 07/28/2018 17:32      Scheduled Meds: . escitalopram  20 mg Oral QHS  . feeding supplement (ENSURE ENLIVE)  237 mL Oral BID BM  . insulin aspart  0-5 Units Subcutaneous QHS  . insulin aspart  0-9 Units Subcutaneous TID WC  . iron polysaccharides  150 mg Oral Daily  . levothyroxine  75 mcg Oral QAC breakfast  . metoprolol tartrate  12.5 mg Oral BID  . multivitamin with minerals  1 tablet Oral Daily  . pantoprazole  40 mg Oral Daily  . polyethylene glycol  17 g Oral Daily  . sodium phosphate  1  enema Rectal Once   Continuous Infusions: . heparin 1,100 Units/hr (07/29/18 1801)  . methylPREDNISolone (SOLU-MEDROL) injection 250 mg (07/30/18 0445)     LOS: 7 days    Time spent in minutes: 35    Debbe Odea, MD [facility] Pager: [email or web address] Password [email or web address] 07/30/2018, 11:19 AM

## 2018-07-30 NOTE — Progress Notes (Signed)
ANTICOAGULATION CONSULT NOTE - Follow Up Consult  Pharmacy Consult for IV heparin (Eliquis on hold) Indication: atrial fibrillation  Allergies  Allergen Reactions  . Penicillins Anaphylaxis and Other (See Comments)    Has patient had a PCN reaction causing immediate rash, facial/tongue/throat swelling, SOB or lightheadedness with hypotension: Yes Has patient had a PCN reaction causing severe rash involving mucus membranes or skin necrosis: No Has patient had a PCN reaction that required hospitalization: No Has patient had a PCN reaction occurring within the last 10 years: No If all of the above answers are "NO", then may proceed with Cephalosporin use.   . Atorvastatin Other (See Comments)    Muscle pain  . Codeine Other (See Comments)    Nausea/Vomiting  . Colesevelam Other (See Comments)    Unsure of exact reaction type  . Ezetimibe Other (See Comments)    Pain in legs    Patient Measurements: Height: 5\' 4"  (162.6 cm) Weight: 131 lb 2.8 oz (59.5 kg) IBW/kg (Calculated) : 54.7 Heparin Dosing Weight: 56.7 kg  Vital Signs: Temp: 98 F (36.7 C) (12/29 1142) Temp Source: Oral (12/29 1142) BP: 117/74 (12/29 1142) Pulse Rate: 74 (12/29 1142)  Labs: Recent Labs    07/28/18 0246  07/28/18 1255  07/29/18 0204 07/30/18 0329 07/30/18 1155  HGB 9.4*  --   --   --  9.1* 8.8*  --   HCT 30.5*  --   --   --  29.2* 28.4*  --   PLT 289  --   --   --  324 313  --   APTT 44*   < > 40*   < > 84* 104* 116*  HEPARINUNFRC 1.16*  --  1.06*  --  1.18* 1.34*  --   CREATININE 1.06*  --   --   --  1.33* 1.25*  --    < > = values in this interval not displayed.    Estimated Creatinine Clearance: 30 mL/min (A) (by C-G formula based on SCr of 1.25 mg/dL (H)).   Medical History: Past Medical History:  Diagnosis Date  . Atrial fibrillation ([place])   . Carotid artery stenosis with cerebral infarction ([place])   . Carotid atherosclerosis, bilateral   . Chronic bronchitis ([place])   . Depression    . Dyslipidemia   . Edema   . History of blood transfusion 04/24/2018   "low HgB" (04/25/2018)  . Hyperplastic colon polyp   . Hypertension   . Hypothyroidism   . Osteoporosis   . Peripheral vascular disease of extremity ([place])   . PSVT (paroxysmal supraventricular tachycardia) ([place])   . Skin cancer    "scraped off my neck and legs" (04/25/2018)  . Subclavian artery stenosis ([facility])     Medications:  Infusions:  . heparin 1,100 Units/hr (07/29/18 1801)  . methylPREDNISolone (SOLU-MEDROL) injection 250 mg (07/30/18 1157)    Assessment: 84 yo on chronic Eliquis for afib, currently held for [facility] next week.  Has not had Eliquis dose since 12/25 AM since on bipap and unable to take po.   Patient is currently on IV heparin. APTT this morning is near therapeutic at 104 on 1100 units/hr. HL remains falsely elevated at 1.34. Hb low, but stable. No signs/symptoms of bleeding or issues with infusion reported per RN.   Goal of Therapy:  Heparin level 0.3-0.7 units/ml  APTT 66-102s Monitor platelets by anticoagulation protocol: Yes   Plan:  1. Continue IV heparin to 1100 units/hr  2. Confirm aPTT in  8 hours 3. Daily aPTT, heparin level and CBC. 4. F/u plans for anticoagulation after cath on Monday.  Claiborne Billings, PharmD PGY2 Cardiology Pharmacy Resident Phone [phone] Please check AMION for all Pharmacist numbers by unit 07/30/2018 1:56 PM    Addendum: Recheck aPTT came back elevated at 116, decrease to heparin 1050 units/hr. Recheck aPTT in 8 hours.   Claiborne Billings, PharmD PGY2 Cardiology Pharmacy Resident Phone [phone] Please check AMION for all Pharmacist numbers by unit 07/30/2018 2:01 PM

## 2018-07-30 NOTE — Progress Notes (Signed)
ANTICOAGULATION CONSULT NOTE - Follow Up Consult  Pharmacy Consult for IV heparin (Eliquis on hold) Indication: atrial fibrillation  Allergies  Allergen Reactions  . Penicillins Anaphylaxis and Other (See Comments)    Has patient had a PCN reaction causing immediate rash, facial/tongue/throat swelling, SOB or lightheadedness with hypotension: Yes Has patient had a PCN reaction causing severe rash involving mucus membranes or skin necrosis: No Has patient had a PCN reaction that required hospitalization: No Has patient had a PCN reaction occurring within the last 10 years: No If all of the above answers are "NO", then may proceed with Cephalosporin use.   . Atorvastatin Other (See Comments)    Muscle pain  . Codeine Other (See Comments)    Nausea/Vomiting  . Colesevelam Other (See Comments)    Unsure of exact reaction type  . Ezetimibe Other (See Comments)    Pain in legs    Patient Measurements: Height: 5\' 4"  (162.6 cm) Weight: 131 lb 2.8 oz (59.5 kg) IBW/kg (Calculated) : 54.7 Heparin Dosing Weight: 56.7 kg  Vital Signs: Temp: 97.9 F (36.6 C) (12/29 1921) Temp Source: Oral (12/29 1921) BP: 156/75 (12/29 1921) Pulse Rate: 70 (12/29 1921)  Labs: Recent Labs    07/28/18 0246  07/28/18 1255  07/29/18 0204 07/30/18 0329 07/30/18 1155 07/30/18 1938  HGB 9.4*  --   --   --  9.1* 8.8*  --   --   HCT 30.5*  --   --   --  29.2* 28.4*  --   --   PLT 289  --   --   --  324 313  --   --   APTT 44*   < > 40*   < > 84* 104* 116* 113*  HEPARINUNFRC 1.16*  --  1.06*  --  1.18* 1.34*  --   --   CREATININE 1.06*  --   --   --  1.33* 1.25*  --   --    < > = values in this interval not displayed.    Estimated Creatinine Clearance: 30 mL/min (A) (by C-G formula based on SCr of 1.25 mg/dL (H)).   Medical History: Past Medical History:  Diagnosis Date  . Atrial fibrillation ([place])   . Carotid artery stenosis with cerebral infarction ([place])   . Carotid atherosclerosis, bilateral    . Chronic bronchitis ([place])   . Depression   . Dyslipidemia   . Edema   . History of blood transfusion 04/24/2018   "low HgB" (04/25/2018)  . Hyperplastic colon polyp   . Hypertension   . Hypothyroidism   . Osteoporosis   . Peripheral vascular disease of extremity ([place])   . PSVT (paroxysmal supraventricular tachycardia) ([place])   . Skin cancer    "scraped off my neck and legs" (04/25/2018)  . Subclavian artery stenosis ([facility])     Medications:  Infusions:  . sodium chloride    . [START ON 07/31/2018] sodium chloride    . heparin 1,050 Units/hr (07/30/18 1900)  . methylPREDNISolone (SOLU-MEDROL) injection Stopped (07/30/18 1752)    Assessment: 84 yo on chronic Eliquis for afib, currently held for [facility] next week.  Has not had Eliquis dose since 12/25 AM since on bipap and unable to take po.   Patient is currently on IV heparin. APTT came back still elevated at 113, on 1050 units/hr. No s/sx of bleeding. No infusion issues.   Goal of Therapy:  Heparin level 0.3-0.7 units/ml  APTT 66-102s Monitor platelets by anticoagulation protocol:  Yes   Plan:  1. Reduce IV heparin to 900 units/hr  2. Daily aPTT, heparin level and CBC. 3. F/u plans for anticoagulation after cath on Monday.  Antonietta Jewel, PharmD, [place] Clinical Pharmacist  Pager: [phone] Phone: [phone] Please check AMION for all Pharmacist numbers by unit 07/30/2018 8:37 PM

## 2018-07-30 NOTE — Progress Notes (Signed)
NAME:  Brooke Mullins, MRN:  [id number], DOB:  1934/05/21, LOS: 7 ADMISSION DATE:  07/23/2018, CONSULTATION DATE:  07/27/18 REFERRING MD:  Wynelle Cleveland  CHIEF COMPLAINT:  SOB   Brief History   Brooke [place]ins is [place] 84 y.o. female who was admitted 12/22 after fall.  Found to have right hip fx's that are being managed conservatively. PCCM called 12/26 due to high FiO2 needs due to possible underlying IPF (see discussion in HPI regarding IPF diagnosis).    ILD HX   [facility] Comprehensive ILD Questionnaire - family and patient filled this out and this is available currently   Symptoms:    Started amiodarone tab in may 2019. Resp issues developed after that. Major hypoxemia episode was sept 2019. Dyspnea progressive.  Level 2 dyspnea at rest.  Level for dyspnea for activities of daily living and level 5 dyspnea for walking.  There is associated cough.  The cough also started in 2019 along with the shortness of breath.  It is moderate in intensity.  She coughs at night.  There is yellow phlegm.  The coughing is affecting the voice.   in November 2019, she saw Dr. Bernette Mayers in [place] who "used [place] camera to go down and take pieces from my lung".  She was told that she had IPF and was started on '20mg'$  prednisone daily.  Ofev has been approived but not started    Past Medical History : Heart failure with atrial fibrillation.  On amiodarone since May 2019.  No collagen vascular disease.  No seizures.  Reported diagnosis of thyroid disease since July or August 2019 again postdating amiodarone.  No stroke or seizures.  No hepatitis.  No pneumonia history.  No blood clots.  No pleurisy.   ROS: Does have fatigue but no arthralgia.  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  After the fall in June she is lost 34 pounds.  No nausea vomiting rash   FAMILY HISTORY of LUNG DISEASE: Her father and sister had COPD but she denies any pulmonary fibrosis   EXPOSURE  HISTORY: She smoked cigarettes 1 pack [place] day.  Do not know when she started but she quit in 1994.  There is no passive smoking exposure.  No marijuana or vapor cocaine or intravenous drug use exposure   HOME and HOBBY DETAILS : [place].  Age of the home is 100 years.  There is no dampness.  There is normal level of mildew in the shower curtain on occasion.  She does use the bathroom where there is mildew.  Does not use [place] humidifier.  No CPAP mask at home.  Does have [place] nebulizer machine but no contamination.  She does have [place] steam iron but no contamination.  No pet birds or parakeets of feather pillow.  No mold in the [facility] duct.  No gardening or music habits.   OCCUPATIONAL HISTORY (122 questions) : Positive for previously working in [place] environment and she is worked in the [employer] before she retired.  During this time the environment was dusty   PULMONARY TOXICITY HISTORY (27 items): She has been on amiodarone since May 2019.  And currently is in November 2019 on prednisone 20 mg/day  PULMONARY TOXICITY HISTORY (27 items): She has been on amiodarone since May 2019.  And currently is in November 2019 on prednisone 20 mg/day Results for Brooke Mullins,  Brooke Mullins (MRN [id number]) as of 07/30/2018 12:45  Ref. Range 07/27/2018 13:26  ANA Ab, IFA Unknown Negative  ANCA Proteinase 3 Latest Ref Range: 0.0 - 3.5 U/mL <3.5  CCP Antibodies IgG/IgA Latest Ref Range: 0 - 19 units 8  ds DNA Ab Latest Ref Range: 0 - 9 IU/mL <1  GBM Ab Latest Ref Range: 0 - 20 units 3  Myeloperoxidase Abs Latest Ref Range: 0.0 - 9.0 U/mL <9.0  RA Latex Turbid. Latest Ref Range: 0.0 - 13.9 IU/mL 46.2 (H)  SSA (Ro) (ENA) Antibody, IgG Latest Ref Range: 0.0 - 0.9 AI <0.2  SSB (La) (ENA) Antibody, IgG Latest Ref Range: 0.0 - 0.9 AI <0.2  Scleroderma (Scl-70) (ENA) Antibody, IgG Latest Ref Range: 0.0 - 0.9 AI <0.2  Results for Brooke Mullins, Brooke Mullins (MRN [id number]) as of 07/30/2018  12:45  Ref. Range 07/27/2018 13:26  Sed Rate Latest Ref Range: 0 - 22 mm/hr 72 (H)     Significant Hospital Events   12/22 > admit. 12/26 > PCCM consult. Steroids increased to '60mg'$  Q6h.  RVP - negative, Urine strep negzativeSed rate 70s and RF mildly elevated . Rest negative. HP panel pending 12/27: negative 2.4 liters. . ILD questionnaire - Amio intake +, Engineer, manufacturing systems +. Mild mildew/mold exppsure in bathroom in 82 year old home, prior smoker  12/28 - Remains on HFNC 75%,. Easy desats into the 70s with movement. Not using night bipap. Steroids increased to 1gm/day    Consults:  Orthopedics. PCCM.  Procedures:  None.  Significant Diagnostic Tests:  CT chest 12/25 > Diffuse GGO's with bronchiectasis.  New compression fx of T4 vertebral body.  Micro Data:  Sputum 12/26 >   Antimicrobials:  None.   Interim history/subjective:    12/29 - HFNC 70% continues. Easy desaturation with movement to pulse ox 70%. Continues on 1g solumedrol/day  Objective:  Blood pressure 117/74, pulse 74, temperature 98 F (36.7 C), temperature source Oral, resp. rate (!) 23, height '5\' 4"'$  (1.626 m), weight 59.5 kg, SpO2 98 %.    FiO2 (%):  [70 %-75 %] 70 %   Intake/Output Summary (Last 24 hours) at 07/30/2018 1242 Last data filed at 07/30/2018 1100 Gross per 24 hour  Intake 532 ml  Output 600 ml  Net -68 ml   Filed Weights   07/28/18 0346 07/29/18 0500 07/30/18 0356  Weight: 58.9 kg 58.8 kg 59.5 kg   General Appearance:  Stable looking., deconditioned somewhat, sitting bed and chatting with niece. Denies complanits Head:  Normocephalic, without obvious abnormality, atraumatic Eyes:  PERRL - yes, conjunctiva/corneas - clear     Ears:  Normal external ear canals, both ears Nose:  G tube - no but has HFNC Throat:  ETT TUBE - no , OG tube - no Neck:  Supple,  No enlargement/tenderness/nodules Lungs: No distress. Scattered crack;es + Heart:  S1 and S2 normal, no murmur, CVP - no.   Pressors - no Abdomen:  Soft, no masses, no organomegaly Genitalia / Rectal:  Not done Extremities:  Extremities- intact Skin:  ntact in exposed areas . Sacral area - not examined Neurologic:  Sedation - none -> RASS - +! Marland Kitchen Moves all 4s - yes. CAM-ICU - neg . Orientation - x3+       Assessment & Plan:  Acute on chronic hypoxic resp failure w/ pulmonary infiltrates.  - dx is most likely Acute Amio lung toxicitity (BOOP) - based on ggo with high esr and post amio findings  - ddx is  UIP flare (undiagnosed UIP) and subacute hypersensitivity pneumonitis  12/29 - unimproved unless we call moving from75% HFNC to 70% HFNC as "improvement"   PLAN  - IV steroid solumedrol at 1g/day (patient and niece again warned of risks but no choice at this point) x 48h through 07/30/18 and then prednisone at '1mg'$ /kg (max dose '80mg'$  per day) and then slow taper over 8-16 weeks to off  - no amiodarone  - o2 for pulse ox > 88% - likely needs LTAC  - await HP panel (can be helpful if positive)  - no bronch (too high risk) - get records from Dr Alcide Clever during week - no role for Ofev/esbriet (patient has this ordered through Dr Alcide Clever) for now - can follow in [place] ILD clinic with Dr Chase Caller  - consider for ILD PRO study (registry study in progressive non-IPF ILD sponsored by [employer] and through [facility])  If no improvement over weeks then poor prognosis   Best Practice:  Diet: Regular Pain/Anxiety/Delirium protocol (if indicated): N/[place]. VAP protocol (if indicated): N/[place]. DVT prophylaxis: SCD's / apixaban. GI prophylaxis: Pantoprazole. Glucose control: N/[place]. Mobility: Bedrest. Code Status: DNR. Family Communication: patient and sister Disposition: Progressive Care. [place] LTACT      SIGNATURE    Dr. Brand Males, M.D., F.C.C.P,  Pulmonary and Critical Care Medicine Staff Physician, [place] Director - Interstitial Lung Disease  Program  Pulmonary [facility] [place], [place], [address]  Pager: [phone], If no answer or between  15:00h - 7:00h: call [phone] Telephone: [phone]  12:42 PM 07/30/2018

## 2018-07-31 ENCOUNTER — Inpatient Hospital Stay (HOSPITAL_COMMUNITY): Payer: Medicare Other

## 2018-07-31 ENCOUNTER — Encounter (HOSPITAL_COMMUNITY)
Admission: EM | Disposition: A | Discharge status: Skilled Nursing Facility | Payer: Self-pay | Source: Home / Self Care | Attending: Internal Medicine

## 2018-07-31 DIAGNOSIS — I214 Non-ST elevation (NSTEMI) myocardial infarction: Secondary | ICD-10-CM

## 2018-07-31 DIAGNOSIS — R0602 Shortness of breath: Secondary | ICD-10-CM | POA: Diagnosis present

## 2018-07-31 DIAGNOSIS — R0600 Dyspnea, unspecified: Secondary | ICD-10-CM

## 2018-07-31 DIAGNOSIS — I4819 Other persistent atrial fibrillation: Secondary | ICD-10-CM

## 2018-07-31 HISTORY — PX: RIGHT/LEFT HEART CATH AND CORONARY ANGIOGRAPHY: CATH118266

## 2018-07-31 LAB — POCT I-STAT 3, ART BLOOD GAS (G3+)
Acid-Base Excess: 12 mmol/L — ABNORMAL HIGH (ref 0.0–2.0)
Bicarbonate: 36.1 mmol/L — ABNORMAL HIGH (ref 20.0–28.0)
O2 Saturation: 100 %
TCO2: 37 mmol/L — ABNORMAL HIGH (ref 22–32)
pCO2 arterial: 44.8 mmHg (ref 32.0–48.0)
pH, Arterial: 7.514 — ABNORMAL HIGH (ref 7.350–7.450)
pO2, Arterial: 286 mmHg — ABNORMAL HIGH (ref 83.0–108.0)

## 2018-07-31 LAB — BASIC METABOLIC PANEL
Anion gap: 12 (ref 5–15)
BUN: 35 mg/dL — ABNORMAL HIGH (ref 8–23)
CO2: 28 mmol/L (ref 22–32)
Calcium: 8.7 mg/dL — ABNORMAL LOW (ref 8.9–10.3)
Chloride: 93 mmol/L — ABNORMAL LOW (ref 98–111)
Creatinine, Ser: 0.94 mg/dL (ref 0.44–1.00)
GFR calc Af Amer: >60 mL/min (ref >60)
GFR calc non Af Amer: 56 mL/min — ABNORMAL LOW (ref >60)
Glucose, Bld: 225 mg/dL — ABNORMAL HIGH (ref 70–99)
POTASSIUM: 4.4 mmol/L (ref 3.5–5.1)
Sodium: 133 mmol/L — ABNORMAL LOW (ref 135–145)

## 2018-07-31 LAB — GLUCOSE, CAPILLARY
GLUCOSE-CAPILLARY: 250 mg/dL — AB (ref 70–99)
GLUCOSE-CAPILLARY: 230 mg/dL — AB (ref 70–99)
Glucose-Capillary: 250 mg/dL — ABNORMAL HIGH (ref 70–99)
Glucose-Capillary: 298 mg/dL — ABNORMAL HIGH (ref 70–99)

## 2018-07-31 LAB — POCT I-STAT 3, VENOUS BLOOD GAS (G3P V)
Acid-Base Excess: 9 mmol/L — ABNORMAL HIGH (ref 0.0–2.0)
Bicarbonate: 33.4 mmol/L — ABNORMAL HIGH (ref 20.0–28.0)
O2 Saturation: 75 %
TCO2: 35 mmol/L — ABNORMAL HIGH (ref 22–32)
pCO2, Ven: 45.2 mmHg (ref 44.0–60.0)
pH, Ven: 7.476 — ABNORMAL HIGH (ref 7.250–7.430)
pO2, Ven: 38 mmHg (ref 32.0–45.0)

## 2018-07-31 LAB — CBC
HEMATOCRIT: 27.9 % — AB (ref 36.0–46.0)
HEMOGLOBIN: 8.8 g/dL — AB (ref 12.0–15.0)
MCH: 24 pg — ABNORMAL LOW (ref 26.0–34.0)
MCHC: 31.5 g/dL (ref 30.0–36.0)
MCV: 76 fL — ABNORMAL LOW (ref 80.0–100.0)
Platelets: 339 10*3/uL (ref 150–400)
RBC: 3.67 MIL/uL — ABNORMAL LOW (ref 3.87–5.11)
RDW: 21.6 % — ABNORMAL HIGH (ref 11.5–15.5)
WBC: 8.3 10*3/uL (ref 4.0–10.5)
nRBC: 0 % (ref 0.0–0.2)

## 2018-07-31 LAB — HYPERSENSITIVITY PNEUMONITIS
A. Pullulans Abs: NEGATIVE
A.Fumigatus #1 Abs: NEGATIVE
Micropolyspora faeni, IgG: NEGATIVE
PIGEON SERUM ABS: NEGATIVE
Thermoact. Saccharii: NEGATIVE
Thermoactinomyces vulgaris, IgG: NEGATIVE

## 2018-07-31 LAB — OCCULT BLOOD X 1 CARD TO LAB, STOOL: Fecal Occult Bld: NEGATIVE

## 2018-07-31 LAB — APTT: aPTT: 96 seconds — ABNORMAL HIGH (ref 24–36)

## 2018-07-31 LAB — HEPARIN LEVEL (UNFRACTIONATED): Heparin Unfractionated: 1.22 IU/mL — ABNORMAL HIGH (ref 0.30–0.70)

## 2018-07-31 SURGERY — RIGHT/LEFT HEART CATH AND CORONARY ANGIOGRAPHY
Anesthesia: LOCAL

## 2018-07-31 MED ORDER — LIDOCAINE HCL (PF) 1 % IJ SOLN
INTRAMUSCULAR | Status: AC
  Filled 2018-07-31: qty 30

## 2018-07-31 MED ORDER — ONDANSETRON HCL 4 MG/2ML IJ SOLN: 4.0000 mg | Freq: Four times a day (QID) | INTRAMUSCULAR | Status: DC | PRN

## 2018-07-31 MED ORDER — HEPARIN SODIUM (PORCINE) 1000 UNIT/ML IJ SOLN
INTRAMUSCULAR | Status: AC
  Filled 2018-07-31: qty 1

## 2018-07-31 MED ORDER — VERAPAMIL HCL 2.5 MG/ML IV SOLN
INTRAVENOUS | Status: AC
  Filled 2018-07-31: qty 2

## 2018-07-31 MED ORDER — HEPARIN (PORCINE) IN NACL 1000-0.9 UT/500ML-% IV SOLN
INTRAVENOUS | Status: AC
  Filled 2018-07-31: qty 1000

## 2018-07-31 MED ORDER — LIDOCAINE HCL (PF) 1 % IJ SOLN
INTRAMUSCULAR | Status: DC | PRN
  Administered 2018-07-31 (×2): 2 mL via INTRADERMAL

## 2018-07-31 MED ORDER — SODIUM CHLORIDE 0.9 % IV SOLN
INTRAVENOUS | Status: AC
  Administered 2018-07-31: 16:00:00 via INTRAVENOUS

## 2018-07-31 MED ORDER — VERAPAMIL HCL 2.5 MG/ML IV SOLN
INTRAVENOUS | Status: DC | PRN
  Administered 2018-07-31: 10 mL via INTRA_ARTERIAL

## 2018-07-31 MED ORDER — HEPARIN SODIUM (PORCINE) 1000 UNIT/ML IJ SOLN
INTRAMUSCULAR | Status: DC | PRN
  Administered 2018-07-31: 3000 [IU] via INTRAVENOUS

## 2018-07-31 MED ORDER — HEPARIN (PORCINE) 25000 UT/250ML-% IV SOLN
1000.0000 [IU]/h | INTRAVENOUS | Status: DC
  Administered 2018-08-01: 900 [IU]/h via INTRAVENOUS
  Filled 2018-07-31: qty 250

## 2018-07-31 MED ORDER — INSULIN ASPART 100 UNIT/ML ~~LOC~~ SOLN
0.0000 [IU] | Freq: Three times a day (TID) | SUBCUTANEOUS | Status: DC
  Administered 2018-07-31: 5 [IU] via SUBCUTANEOUS
  Administered 2018-08-01: 11 [IU] via SUBCUTANEOUS
  Administered 2018-08-01: 8 [IU] via SUBCUTANEOUS
  Administered 2018-08-01: 5 [IU] via SUBCUTANEOUS
  Administered 2018-08-02: 3 [IU] via SUBCUTANEOUS
  Administered 2018-08-02: 8 [IU] via SUBCUTANEOUS
  Administered 2018-08-02: 3 [IU] via SUBCUTANEOUS
  Administered 2018-08-03: 5 [IU] via SUBCUTANEOUS
  Administered 2018-08-03 – 2018-08-04 (×2): 3 [IU] via SUBCUTANEOUS
  Administered 2018-08-04: 1 [IU] via SUBCUTANEOUS

## 2018-07-31 MED ORDER — SODIUM CHLORIDE 0.9% FLUSH: 3.0000 mL | INTRAVENOUS | Status: DC | PRN

## 2018-07-31 MED ORDER — SODIUM CHLORIDE 0.9 % IV SOLN: 250.0000 mL | INTRAVENOUS | Status: DC | PRN

## 2018-07-31 MED ORDER — SODIUM CHLORIDE 0.9% FLUSH
3.0000 mL | Freq: Two times a day (BID) | INTRAVENOUS | Status: DC
  Administered 2018-07-31 – 2018-08-04 (×7): 3 mL via INTRAVENOUS

## 2018-07-31 MED ORDER — ACETAMINOPHEN 325 MG PO TABS: 650.0000 mg | ORAL_TABLET | ORAL | Status: DC | PRN

## 2018-07-31 MED ORDER — HEPARIN (PORCINE) IN NACL 1000-0.9 UT/500ML-% IV SOLN
INTRAVENOUS | Status: DC | PRN
  Administered 2018-07-31 (×2): 500 mL

## 2018-07-31 SURGICAL SUPPLY — 12 items
CATH 5FR JL3.5 JR4 ANG PIG MP (CATHETERS) ×2 IMPLANT
CATH BALLN WEDGE 5F 110CM (CATHETERS) ×2 IMPLANT
DEVICE RAD COMP TR BAND LRG (VASCULAR PRODUCTS) ×2 IMPLANT
GLIDESHEATH SLEND SS 6F .021 (SHEATH) ×2 IMPLANT
GUIDEWIRE INQWIRE 1.5J.035X260 (WIRE) ×1 IMPLANT
INQWIRE 1.5J .035X260CM (WIRE) ×2
KIT HEART LEFT (KITS) ×2 IMPLANT
PACK CARDIAC CATHETERIZATION (CUSTOM PROCEDURE TRAY) ×2 IMPLANT
SHEATH GLIDE SLENDER 4/5FR (SHEATH) ×2 IMPLANT
SHEATH PROBE COVER 6X72 (BAG) ×2 IMPLANT
TRANSDUCER W/STOPCOCK (MISCELLANEOUS) ×2 IMPLANT
TUBING CIL FLEX 10 FLL-RA (TUBING) ×2 IMPLANT

## 2018-07-31 NOTE — Progress Notes (Signed)
PROGRESS NOTE    Brooke [place]ins   WUJ:[id number]  DOB: [birth year]  DOA: 07/23/2018 PCP: Park Liter, MD   Brief Narrative:  [name] 84 year old female with history of recently diagnosed pulmonary fibrosis chronic hypoxic respiratory failure on 2 L nasal cannula just prior to admission, persistent A- fib, secondary pulm HTN, PVD came to the ER after fall at home.    Admitted from 04/25/18- 04/28/18 to the cardiology service (as a transfer from [place]) for dyspnea with a productive cough, hypoxia needing a BiPAP. She was diuresed and was weaned down to about 3-4 l O2 when discharged.  She followed up later with a pulmonologist in [place], Dr Bernette Mayers, underwent a lung biopsy and was diagnosed with IPF and started on 20 mg of Prednisone daily. She was also prescribed another medications which they are trying to get approved through her insurance.  She had noted more shortness of breath the day before the fall that brought her to the ED.   In the ER, she was found to have right pelvic fracture, was admitted for pain control & nonoperative management. H and P notes that her pulse ox was 81% the day before.  I started to see her on 12/25 and noted she was on 100% FiO2 via a Face mask. It was noted the night before the she had become more hypoxic. A dose of IV Solumedrol and one of IV Lasix were given. CXR showed interstitial pulmonary edema.  Subjective: Good BM yesterday after enema. No new complaints.     Assessment & Plan:   Principal Problem:   Acute on chronic respiratory failure with hypoxia  H/o pulmonary fibrosis on Prednisone - last admitted in Sept with acute resp failure from CHF, ECHO showed severe concentric LV wall thickening with speckled intraventricular septum suggestive of infiltrative cardiomyopathy such as amyloidosis.  - CXR suggestive of pulm edema- given multiple doses of Lasix   - 12/25- not improving- checked chest CT- showed b/l ground glass infiltrates-  checked pro calcitonin which was negative - transfered to SDU for BiPAP on 12/25- given further Lasix    - 12/26 - not improving- cardiology and pulmonary consults requested- suspecting Amio induced pulmonary toxicity - Amio stopped and IV steroids started by Pulm-  - Per pulm today > 'Start prednisone at 1mg /kg (max dose 80mg  per day) on 12/31 and then slow taper over 8-16 weeks to off -Continue supplemental oxygen to maintain saturations 88 to 92%-No role for Ofev/esbriet (patient has this ordered through Dr Alcide Clever) for now' -pulmonary plans to make a f/u in ILD clinic with Dr Chase Caller to be considered for ILD PRO study  Active Problems:     Persistent atrial fibrillation - cont Metoprolol, Amio, Heparin infusion- Apixaban on hold   Mild AKI   - baseline Cr ~ 0.8-0.9- Cr currently 1.33 with elevated BUN likely a bit dry due to lasix- - Cr better today - follow urine output    Infiltrative cardiomyopathy?-   chronic diastolic CHF/ secondary pulm HTN - see above in regards to diuretics - cardiology following - TTR Amyloid scan neg for amyloid per Cardiology     Pelvic fracture/   Fall at home, initial encounter - pain control- will be going to SNF in [place]    Hypothyroidism - Synthroid  Constipated - cont laxatives  DVT prophylaxis: Eliquis Code Status: DNR Family Communication: daughter and son Disposition Plan:  Follow in SDU Consultants:   PCCM  Cardiology Procedures:   none  Antimicrobials:  Anti-infectives (From admission, onward)   None       Objective: Vitals:   07/31/18 0303 07/31/18 0355 07/31/18 0725 07/31/18 0739  BP: (!) 132/54   (!) 161/76  Pulse: 77   75  Resp: 14   16  Temp: 98 F (36.7 C)   97.8 F (36.6 C)  TempSrc: Oral   Oral  SpO2: 97%  99% 100%  Weight:  61.8 kg    Height:        Intake/Output Summary (Last 24 hours) at 07/31/2018 1203 Last data filed at 07/31/2018 0900 Gross per 24 hour  Intake 1058.36 ml  Output 250 ml  Net  808.36 ml   Filed Weights   07/29/18 0500 07/30/18 0356 07/31/18 0355  Weight: 58.8 kg 59.5 kg 61.8 kg    Examination: General exam: Appears comfortable  HEENT: PERRLA, oral mucosa moist, no sclera icterus or thrush Respiratory system: b/l crackles- on high flow [place]. Respiratory effort normal. Cardiovascular system: S1 & S2 heard,  No murmurs  Gastrointestinal system: Abdomen soft, non-tender, nondistended. Normal bowel sound. No organomegaly Central nervous system: Alert and oriented. No focal neurological deficits. Extremities: No cyanosis, clubbing or edema Skin: No rashes or ulcers Psychiatry:  Mood & affect appropriate.     Data Reviewed: I have personally reviewed following labs and imaging studies  CBC: Recent Labs  Lab 07/27/18 0148 07/28/18 0246 07/29/18 0204 07/30/18 0329 07/31/18 0228  WBC 11.3* 8.0 10.8* 8.2 8.3  HGB 9.0* 9.4* 9.1* 8.8* 8.8*  HCT 29.4* 30.5* 29.2* 28.4* 27.9*  MCV 74.8* 75.7* 75.1* 75.1* 76.0*  PLT 238 289 324 313 456   Basic Metabolic Panel: Recent Labs  Lab 07/27/18 0148 07/28/18 0246 07/29/18 0204 07/30/18 0329 07/31/18 0228  NA 131* 131* 131* 133* 133*  K 4.1 4.6 3.9 4.1 4.4  CL 92* 89* 90* 93* 93*  CO2 29 27 26 28 28   GLUCOSE 122* 252* 262* 237* 225*  BUN 17 28* 48* 46* 35*  CREATININE 0.89 1.06* 1.33* 1.25* 0.94  CALCIUM 8.4* 8.8* 8.9 8.6* 8.7*   GFR: Estimated Creatinine Clearance: 39.8 mL/min (by C-G formula based on SCr of 0.94 mg/dL). Liver Function Tests: No results for input(s): AST, ALT, ALKPHOS, BILITOT, PROT, ALBUMIN in the last 168 hours. Recent Labs  Lab 07/27/18 1326  LIPASE 52*   No results for input(s): AMMONIA in the last 168 hours. Coagulation Profile: No results for input(s): INR, PROTIME in the last 168 hours. Cardiac Enzymes: Recent Labs  Lab 07/27/18 1326  CKTOTAL 14*  CKMB 1.1   BNP (last 3 results) Recent Labs    05/12/18 1020  PROBNP 2,040*   HbA1C: No results for input(s): HGBA1C  in the last 72 hours. CBG: Recent Labs  Lab 07/30/18 0805 07/30/18 1141 07/30/18 1709 07/30/18 2134 07/31/18 0753  GLUCAP 241* 262* 319* 249* 250*   Lipid Profile: No results for input(s): CHOL, HDL, LDLCALC, TRIG, CHOLHDL, LDLDIRECT in the last 72 hours. Thyroid Function Tests: No results for input(s): TSH, T4TOTAL, FREET4, T3FREE, THYROIDAB in the last 72 hours. Anemia Panel: No results for input(s): VITAMINB12, FOLATE, FERRITIN, TIBC, IRON, RETICCTPCT in the last 72 hours. Urine analysis:    Component Value Date/Time   COLORURINE YELLOW (A) 07/23/2018 1516   APPEARANCEUR CLEAR (A) 07/23/2018 1516   LABSPEC 1.020 07/23/2018 1516   PHURINE 7.0 07/23/2018 1516   GLUCOSEU NEGATIVE 07/23/2018 1516   HGBUR NEGATIVE 07/23/2018 [place] 07/23/2018 1516   KETONESUR  NEGATIVE 07/23/2018 1516   PROTEINUR NEGATIVE 07/23/2018 1516   NITRITE NEGATIVE 07/23/2018 1516   LEUKOCYTESUR POSITIVE (A) 07/23/2018 1516   Sepsis Labs: @LABRCNTIP (procalcitonin:4,lacticidven:4) ) Recent Results (from the past 240 hour(s))  Respiratory Panel by PCR     Status: None   Collection Time: 07/27/18 12:58 PM  Result Value Ref Range Status   Adenovirus NOT DETECTED NOT DETECTED Final   Coronavirus 229E NOT DETECTED NOT DETECTED Final   Coronavirus HKU1 NOT DETECTED NOT DETECTED Final   Coronavirus NL63 NOT DETECTED NOT DETECTED Final   Coronavirus OC43 NOT DETECTED NOT DETECTED Final   Metapneumovirus NOT DETECTED NOT DETECTED Final   Rhinovirus / Enterovirus NOT DETECTED NOT DETECTED Final   Influenza A NOT DETECTED NOT DETECTED Final   Influenza B NOT DETECTED NOT DETECTED Final   Parainfluenza Virus 1 NOT DETECTED NOT DETECTED Final   Parainfluenza Virus 2 NOT DETECTED NOT DETECTED Final   Parainfluenza Virus 3 NOT DETECTED NOT DETECTED Final   Parainfluenza Virus 4 NOT DETECTED NOT DETECTED Final   Respiratory Syncytial Virus NOT DETECTED NOT DETECTED Final   Bordetella  pertussis NOT DETECTED NOT DETECTED Final   Chlamydophila pneumoniae NOT DETECTED NOT DETECTED Final   Mycoplasma pneumoniae NOT DETECTED NOT DETECTED Final    Comment: Performed at [facility], [place] [address], [place], [place] [address]         Radiology Studies: Dg Chest Port 1 View  Result Date: 07/31/2018 CLINICAL DATA:  Acute respiratory failure. EXAM: PORTABLE CHEST 1 VIEW COMPARISON:  CT 07/26/2018.  Chest x-ray 07/25/2018, 05/17/2018. FINDINGS: Cardiomegaly. Diffuse bilateral pulmonary interstitial prominence again noted. These findings have progressed slightly from prior exam. Active interstitial process such as interstitial edema and/or pneumonitis could present this fashion. Unchanged cardiomegaly. No pleural effusion or pneumothorax. Prior thoracic vertebroplasties. IMPRESSION: Cardiomegaly with diffuse bilateral from interstitial prominence again noted. These findings are progressed slightly from prior exam. CHF could present this fashion. Other etiologies of interstitial lung disease including pneumonitis can not be excluded. Electronically Signed   By: [place]   On: 07/31/2018 06:43      Scheduled Meds: . escitalopram  20 mg Oral QHS  . feeding supplement (ENSURE ENLIVE)  237 mL Oral BID BM  . insulin aspart  0-5 Units Subcutaneous QHS  . insulin aspart  0-9 Units Subcutaneous TID WC  . iron polysaccharides  150 mg Oral Daily  . levothyroxine  75 mcg Oral QAC breakfast  . mouth rinse  15 mL Mouth Rinse BID  . metoprolol tartrate  12.5 mg Oral BID  . multivitamin with minerals  1 tablet Oral Daily  . pantoprazole  40 mg Oral Daily  . polyethylene glycol  17 g Oral Daily  . sodium chloride flush  3 mL Intravenous Q12H   Continuous Infusions: . sodium chloride 10 mL/hr at 07/31/18 0555  . sodium chloride 10 mL/hr at 07/31/18 0600  . heparin 900 Units/hr (07/31/18 0600)  . methylPREDNISolone (SOLU-MEDROL) injection Stopped (07/31/18 0525)     LOS: 8  days    Time spent in minutes: 35    Debbe Odea, MD [facility] Pager: [email or web address] Password [email or web address] 07/31/2018, 12:03 PM

## 2018-07-31 NOTE — Progress Notes (Signed)
PT Cancellation Note  Patient Details Name: Brooke Mullins MRN: [id number] DOB: 05/26/1934   Cancelled Treatment:    Reason Eval/Treat Not Completed: Other (comment)(Pt waiting to go to cath lab)   [place] 07/31/2018, 11:47 AM [place] Pager [phone] Office [phone]

## 2018-07-31 NOTE — Interval H&P Note (Signed)
Cath Lab Visit (complete for each Cath Lab visit)  Clinical Evaluation Leading to the Procedure:   ACS: Yes.    Non-ACS:    Anginal Classification: CCS IV  Anti-ischemic medical therapy: Minimal Therapy (1 class of medications)  Non-Invasive Test Results: No non-invasive testing performed  Prior CABG: No previous CABG      History and Physical Interval Note:  07/31/2018 3:18 PM  Brooke Mullins  has presented today for surgery, with the diagnosis of Dyspnea  The various methods of treatment have been discussed with the patient and family. After consideration of risks, benefits and other options for treatment, the patient has consented to  Procedure(s): RIGHT/LEFT HEART CATH AND CORONARY ANGIOGRAPHY (N/A) as a surgical intervention .  The patient's history has been reviewed, patient examined, no change in status, stable for surgery.  I have reviewed the patient's chart and labs.  Questions were answered to the patient's satisfaction.     Larae Grooms

## 2018-07-31 NOTE — Progress Notes (Signed)
NAME:  Brooke Mullins, MRN:  [id number], DOB:  April 03, 1934, LOS: 8 ADMISSION DATE:  07/23/2018, CONSULTATION DATE:  07/27/18 REFERRING MD:  Wynelle Cleveland  CHIEF COMPLAINT:  SOB   Brief History   Brooke Mullins is [place] 84 y.o. female who was admitted 12/22 after fall.  Found to have right hip fx's that are being managed conservatively. PCCM called 12/26 due to high FiO2 needs due to possible underlying IPF (see discussion in HPI regarding IPF diagnosis).  ILD HX   [place] Integrated Comprehensive ILD Questionnaire - family and patient filled this out and this is available currently   Symptoms:    Started amiodarone tab in may 2019. Resp issues developed after that. Major hypoxemia episode was sept 2019. Dyspnea progressive.  Level 2 dyspnea at rest.  Level for dyspnea for activities of daily living and level 5 dyspnea for walking.  There is associated cough.  The cough also started in 2019 along with the shortness of breath.  It is moderate in intensity.  She coughs at night.  There is yellow phlegm.  The coughing is affecting the voice.   in November 2019, she saw Dr. Bernette Mayers in [place] who "used [place] camera to go down and take pieces from my lung".  She was told that she had IPF and was started on 20mg  prednisone daily.  Ofev has been approived but not started    Past Medical History : Heart failure with atrial fibrillation.  On amiodarone since May 2019.  No collagen vascular disease.  No seizures.  Reported diagnosis of thyroid disease since July or August 2019 again postdating amiodarone.  No stroke or seizures.  No hepatitis.  No pneumonia history.  No blood clots.  No pleurisy.   ROS: Does have fatigue but no arthralgia.  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  After the fall in June she is lost 34 pounds.  No nausea vomiting rash   FAMILY HISTORY of LUNG DISEASE: Her father and sister had COPD but she denies any pulmonary fibrosis   EXPOSURE  HISTORY: She smoked cigarettes 1 pack [place] day.  Do not know when she started but she quit in 1994.  There is no passive smoking exposure.  No marijuana or vapor cocaine or intravenous drug use exposure   HOME and HOBBY DETAILS : [place].  Age of the home is 100 years.  There is no dampness.  There is normal level of mildew in the shower curtain on occasion.  She does use the bathroom where there is mildew.  Does not use [place] humidifier.  No CPAP mask at home.  Does have [place] nebulizer machine but no contamination.  She does have [place] steam iron but no contamination.  No pet birds or parakeets of feather pillow.  No mold in the [facility] [employer] [facility] duct.  No gardening or music habits.   OCCUPATIONAL HISTORY (122 questions) : Positive for previously working in [place] environment and she is worked in the [employer] before she retired.  During this time the environment was dusty   PULMONARY TOXICITY HISTORY (27 items): She has been on amiodarone since May 2019.  And currently is in November 2019 on prednisone 20 mg/day  PULMONARY TOXICITY HISTORY (27 items): She has been on amiodarone since May 2019.  And currently is in November 2019 on prednisone 20 mg/day Results for Brooke Mullins (  MRN [id number]) as of 07/30/2018 12:45  Ref. Range 07/27/2018 13:26  ANA Ab, IFA Unknown Negative  ANCA Proteinase 3 Latest Ref Range: 0.0 - 3.5 U/mL <3.5  CCP Antibodies IgG/IgA Latest Ref Range: 0 - 19 units 8  ds DNA Ab Latest Ref Range: 0 - 9 IU/mL <1  GBM Ab Latest Ref Range: 0 - 20 units 3  Myeloperoxidase Abs Latest Ref Range: 0.0 - 9.0 U/mL <9.0  RA Latex Turbid. Latest Ref Range: 0.0 - 13.9 IU/mL 46.2 (H)  SSA (Ro) (ENA) Antibody, IgG Latest Ref Range: 0.0 - 0.9 AI <0.2  SSB (La) (ENA) Antibody, IgG Latest Ref Range: 0.0 - 0.9 AI <0.2  Scleroderma (Scl-70) (ENA) Antibody, IgG Latest Ref Range: 0.0 - 0.9 AI <0.2  Results for Brooke, Mullins (MRN [id number]) as of 07/30/2018  12:45  Ref. Range 07/27/2018 13:26  Sed Rate Latest Ref Range: 0 - 22 mm/hr 72 (H)     Significant Hospital Events   12/22 > admit. 12/26 > PCCM consult. Steroids increased to 60mg  Q6h.  RVP - negative, Urine strep negzativeSed rate 70s and RF mildly elevated . Rest negative. HP panel pending 12/27: negative 2.4 liters. . ILD questionnaire - Amio intake +, Engineer, manufacturing systems +. Mild mildew/mold exppsure in bathroom in 82 year old home, prior smoker 12/28 - Remains on HFNC 75%,. Easy desats into the 70s with movement. Not using night bipap. Steroids increased to 1gm/day 12/30 - LHC/RHC planned today  Consults:  Orthopedics. PCCM 12/26>>  Procedures:  None.  Significant Diagnostic Tests:  CT chest 12/25 > Diffuse GGO's with bronchiectasis.  New compression fx of T4 vertebral body.  Micro Data:  Sputum 12/26 >   Antimicrobials:  None.   Interim history/subjective:  Reports shortness of breath with exertion. Denies chest pain. Has sat on edge of bed but has not ambulated out of bed.   Objective:  Blood pressure (!) 161/76, pulse 75, temperature 97.8 F (36.6 C), temperature source Oral, resp. rate 16, height 5\' 4"  (1.626 m), weight 61.8 kg, SpO2 100 %.    FiO2 (%):  [70 %] 70 %   Intake/Output Summary (Last 24 hours) at 07/31/2018 0945 Last data filed at 07/31/2018 0900 Gross per 24 hour  Intake 1058.36 ml  Output 700 ml  Net 358.36 ml   Filed Weights   07/29/18 0500 07/30/18 0356 07/31/18 0355  Weight: 58.8 kg 59.5 kg 61.8 kg   Physical Exam: General: Frail appearing female laying in bed, no acute distress HENT: [place], AT, OP clear, wearing HFNC Eyes: EOMI, no scleral icterus Respiratory: Bilateral crackles. No wheezing Cardiovascular: RRR, -M/R/G, no JVD GI: BS+, soft, nontender Extremities:-Edema,-tenderness Neuro: AAO x4, CNII-XII grossly intact Skin: Intact, no rashes or bruising Psych: Normal mood, normal affect GU: Foley in place   Assessment & Plan:    84 year old female who initially presented with fall and found with right hip fracture.  PCCM consulted for hypoxemia.  Acute on chronic hypoxemic respiratory failure CT chest 07/26/2018 demonstrates bilateral diffuse interstitial and groundglass infiltrates.  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  Other etiologies considered include undiagnosed UIP subacute hypersensitive pneumonitis. Oxygen requirement appears unchanged despite aggressive steroid treatment.  PLAN -Scheduled for RHC/LHC today. Will follow results -Recommend diuresis as tolerated. Last diuresed on 12/27 -No indication for bronchoscopy in setting of high O2 requirements -Amiodarone has been discontinued.  Recommend against restarting. -Continue IV steroid solumedrol at 1g/day x 48h.  -Start prednisone at 1mg /kg (max dose 80mg  per day)  on 12/31 and then slow taper over 8-16 weeks to off -Continue supplemental oxygen to maintain saturations 88 to 92% -Follow-up HP panel -Follow-up from Dr Alcide Clever during week -No role for Ofev/esbriet (patient has this ordered through Dr Alcide Clever) for now -We will arrange with follow-up in ILD clinic with Dr. Chase Caller to be considered for ILD PRO study as an outpatient  Best Practice:  Diet: Regular Pain/Anxiety/Delirium protocol (if indicated): N/[place]. VAP protocol (if indicated): N/[place]. DVT prophylaxis: SCD's / apixaban. GI prophylaxis: Pantoprazole. Glucose control: N/[place]. Mobility: Bedrest. Code Status: DNR. Family Communication: patient  Disposition: Progressive Care. [facility]  The patient is critically ill with multiple organ systems failure and requires high complexity decision making for assessment and support, frequent evaluation and titration of therapies, application of advanced monitoring technologies and extensive interpretation of multiple databases.   Critical Care Time devoted to patient care services described in this note is  31 Minutes. This time  reflects time of care of this signee Dr. Rodman Pickle. This critical care time does not reflect procedure time, or teaching time or supervisory time of PA/NP/Med student/Med Resident etc but could involve care discussion time.  Rodman Pickle, M.D. [facility]monary/Critical Care Medicine. Pager: [phone]. After hours pager: [phone].

## 2018-07-31 NOTE — Progress Notes (Signed)
ANTICOAGULATION CONSULT NOTE - Follow Up Consult  Pharmacy Consult for IV heparin (Eliquis on hold) Indication: atrial fibrillation  Allergies  Allergen Reactions  . Penicillins Anaphylaxis and Other (See Comments)    Has patient had a PCN reaction causing immediate rash, facial/tongue/throat swelling, SOB or lightheadedness with hypotension: Yes Has patient had a PCN reaction causing severe rash involving mucus membranes or skin necrosis: No Has patient had a PCN reaction that required hospitalization: No Has patient had a PCN reaction occurring within the last 10 years: No If all of the above answers are "NO", then may proceed with Cephalosporin use.   . Atorvastatin Other (See Comments)    Muscle pain  . Codeine Other (See Comments)    Nausea/Vomiting  . Colesevelam Other (See Comments)    Unsure of exact reaction type  . Ezetimibe Other (See Comments)    Pain in legs    Patient Measurements: Height: 5\' 4"  (162.6 cm) Weight: 136 lb 3.9 oz (61.8 kg) IBW/kg (Calculated) : 54.7 Heparin Dosing Weight: 56.7 kg  Vital Signs: Temp: 98 F (36.7 C) (12/30 1620) Temp Source: Oral (12/30 1620) BP: 140/83 (12/30 1620) Pulse Rate: 91 (12/30 1620)  Labs: Recent Labs    07/29/18 0204 07/30/18 0329 07/30/18 1155 07/30/18 1938 07/31/18 0228  HGB 9.1* 8.8*  --   --  8.8*  HCT 29.2* 28.4*  --   --  27.9*  PLT 324 313  --   --  339  APTT 84* 104* 116* 113* 96*  HEPARINUNFRC 1.18* 1.34*  --   --  1.22*  CREATININE 1.33* 1.25*  --   --  0.94    Estimated Creatinine Clearance: 39.8 mL/min (by C-G formula based on SCr of 0.94 mg/dL).   Medical History: Past Medical History:  Diagnosis Date  . Atrial fibrillation ([place])   . Carotid artery stenosis with cerebral infarction ([place])   . Carotid atherosclerosis, bilateral   . Chronic bronchitis ([place])   . Depression   . Dyslipidemia   . Edema   . History of blood transfusion 04/24/2018   "low HgB" (04/25/2018)  . Hyperplastic  colon polyp   . Hypertension   . Hypothyroidism   . Osteoporosis   . Peripheral vascular disease of extremity ([place])   . PSVT (paroxysmal supraventricular tachycardia) ([place])   . Skin cancer    "scraped off my neck and legs" (04/25/2018)  . Subclavian artery stenosis ([facility])     Medications:  Infusions:  . sodium chloride 75 mL/hr at 07/31/18 1627  . sodium chloride    . methylPREDNISolone (SOLU-MEDROL) injection 250 mg (07/31/18 1249)    Assessment: 84 yo on chronic Eliquis for afib PTA, s/p L/RHC finding severe 3v CAD.   Pharmacy consulted to resume heparin 8 hours post sheath removal (documented on 12/30@1553 ). Will resume at previously therapeutic rate of 900 units/hr. Hgb 8.8, plt 339. No s/sx of bleeding documented.  Goal of Therapy:  Heparin level 0.3-0.7 units/ml  APTT 66-102s Monitor platelets by anticoagulation protocol: Yes   Plan:  -Restart heparin infusion at 900 units/hr at 0000 on 12/31 -Order aPTT and heparin level in 8 hr after restart -Monitor daily HL, aPTT, CBC, for s/sx of bleeding  Antonietta Jewel, PharmD, [place] Clinical Pharmacist  Pager: [phone] Phone: [phone] **Pharmacist phone directory can now be found on [email or web address] ([email or web address]).  Listed under [place].

## 2018-07-31 NOTE — Progress Notes (Signed)
ANTICOAGULATION CONSULT NOTE - Follow Up Consult  Pharmacy Consult for IV heparin (Eliquis on hold) Indication: atrial fibrillation  Allergies  Allergen Reactions  . Penicillins Anaphylaxis and Other (See Comments)    Has patient had a PCN reaction causing immediate rash, facial/tongue/throat swelling, SOB or lightheadedness with hypotension: Yes Has patient had a PCN reaction causing severe rash involving mucus membranes or skin necrosis: No Has patient had a PCN reaction that required hospitalization: No Has patient had a PCN reaction occurring within the last 10 years: No If all of the above answers are "NO", then may proceed with Cephalosporin use.   . Atorvastatin Other (See Comments)    Muscle pain  . Codeine Other (See Comments)    Nausea/Vomiting  . Colesevelam Other (See Comments)    Unsure of exact reaction type  . Ezetimibe Other (See Comments)    Pain in legs    Patient Measurements: Height: 5\' 4"  (162.6 cm) Weight: 136 lb 3.9 oz (61.8 kg) IBW/kg (Calculated) : 54.7 Heparin Dosing Weight: 56.7 kg  Vital Signs: Temp: 97.8 F (36.6 C) (12/30 0739) Temp Source: Oral (12/30 0739) BP: 161/76 (12/30 0739) Pulse Rate: 75 (12/30 0739)  Labs: Recent Labs    07/29/18 0204 07/30/18 0329 07/30/18 1155 07/30/18 1938 07/31/18 0228  HGB 9.1* 8.8*  --   --  8.8*  HCT 29.2* 28.4*  --   --  27.9*  PLT 324 313  --   --  339  APTT 84* 104* 116* 113* 96*  HEPARINUNFRC 1.18* 1.34*  --   --  1.22*  CREATININE 1.33* 1.25*  --   --  0.94    Estimated Creatinine Clearance: 39.8 mL/min (by C-G formula based on SCr of 0.94 mg/dL).   Medical History: Past Medical History:  Diagnosis Date  . Atrial fibrillation ([place])   . Carotid artery stenosis with cerebral infarction ([place])   . Carotid atherosclerosis, bilateral   . Chronic bronchitis ([place])   . Depression   . Dyslipidemia   . Edema   . History of blood transfusion 04/24/2018   "low HgB" (04/25/2018)  . Hyperplastic  colon polyp   . Hypertension   . Hypothyroidism   . Osteoporosis   . Peripheral vascular disease of extremity ([place])   . PSVT (paroxysmal supraventricular tachycardia) ([place])   . Skin cancer    "scraped off my neck and legs" (04/25/2018)  . Subclavian artery stenosis (H[facility])     Medications:  Infusions:  . sodium chloride 10 mL/hr at 07/31/18 0555  . sodium chloride 10 mL/hr at 07/31/18 0600  . heparin 900 Units/hr (07/31/18 0600)  . methylPREDNISolone (SOLU-MEDROL) injection Stopped (07/31/18 0525)    Assessment: 84 yo on chronic Eliquis for afib PTA, currently held for [facility] today -heparin level= 1.22, aPTT= 96  Goal of Therapy:  Heparin level 0.3-0.7 units/ml  APTT 66-102s Monitor platelets by anticoagulation protocol: Yes   Plan:  -no heparin changes needed -Will follow plans post cath  Hildred Laser, PharmD Clinical Pharmacist **Pharmacist phone directory can now be found on [email or web address] (PW [email or web address]).  Listed under [place].

## 2018-07-31 NOTE — H&P (View-Only) (Signed)
**Note Brooke-Identified via Obfuscation** Progress Note  Patient Name: Brooke [place]ins Date of Encounter: 07/31/2018  Primary Cardiologist: [name], MD  Subjective   Slept well. Had one firm bowel movement after enema, no obvious blood. Breathing stable. Per nursing, when she moves or if she comes off high flow O2, she desaturates rapidly. She is able to lie nearly flat in bed on high flow and maintaining sats >95%. She has had cath discussed and is amenable.  Inpatient Medications    Scheduled Meds: . escitalopram  20 mg Oral QHS  . feeding supplement (ENSURE ENLIVE)  237 mL Oral BID BM  . insulin aspart  0-5 Units Subcutaneous QHS  . insulin aspart  0-9 Units Subcutaneous TID WC  . iron polysaccharides  150 mg Oral Daily  . levothyroxine  75 mcg Oral QAC breakfast  . mouth rinse  15 mL Mouth Rinse BID  . metoprolol tartrate  12.5 mg Oral BID  . multivitamin with minerals  1 tablet Oral Daily  . pantoprazole  40 mg Oral Daily  . polyethylene glycol  17 g Oral Daily  . sodium chloride flush  3 mL Intravenous Q12H   Continuous Infusions: . sodium chloride 10 mL/hr at 07/31/18 0555  . sodium chloride 10 mL/hr at 07/31/18 0600  . heparin 900 Units/hr (07/31/18 0600)  . methylPREDNISolone (SOLU-MEDROL) injection Stopped (07/31/18 0525)   PRN Meds: sodium chloride, acetaminophen **OR** acetaminophen, albuterol, Heparin (Porcine) in NaCl, HYDROcodone-acetaminophen, morphine injection, polyethylene glycol, polyvinyl alcohol, sodium chloride flush   Vital Signs    Vitals:   07/31/18 0303 07/31/18 0355 07/31/18 0725 07/31/18 0739  BP: (!) 132/54   (!) 161/76  Pulse: 77   75  Resp: 14   16  Temp: 98 F (36.7 C)   97.8 F (36.6 C)  TempSrc: Oral   Oral  SpO2: 97%  99% 100%  Weight:  61.8 kg    Height:        Intake/Output Summary (Last 24 hours) at 07/31/2018 0853 Last data filed at 07/31/2018 0600 Gross per 24 hour  Intake 1058.36 ml  Output 700 ml  Net 358.36 ml   Filed Weights   07/29/18 0500  07/30/18 0356 07/31/18 0355  Weight: 58.8 kg 59.5 kg 61.8 kg    Telemetry    Sinus rhythm, 1st degree AV block, frequent PACs - Personally Reviewed  ECG    No new since 12/22 - Personally Reviewed  Physical Exam   GEN: No acute distress.   Neck: JVD at clavicle at 30 degrees Cardiac: RRR, no murmurs, rubs, or gallops.  Respiratory: distant breath sounds with basilar fine crackles GI: Soft, nontender, non-distended  MS: trace LE bilateral edema; No deformity. Neuro:  Nonfocal  Psych: Normal affect   Labs    Chemistry Recent Labs  Lab 07/29/18 0204 07/30/18 0329 07/31/18 0228  NA 131* 133* 133*  K 3.9 4.1 4.4  CL 90* 93* 93*  CO2 _0 GLUCOSE 262* 237* 225*  BUN 48* 46* 35*  CREATININE 1.33* 1.25* 0.94  CALCIUM 8.9 8.6* 8.7*  GFRNONAA 37* 40* 56*  GFRAA 43* 46* >60  ANIONGAP _1 Hematology Recent Labs  Lab 07/29/18 0204 07/30/18 0329 07/31/18 0228  WBC 10.8* 8.2 8.3  RBC 3.89 3.78* 3.67*  HGB 9.1* 8.8* 8.8*  HCT 29.2* 28.4* 27.9*  MCV 75.1* 75.1* 76.0*  MCH 23.4* 23.3* 24.0*  MCHC 31.2 31.0 31.5  RDW 22.2* 21.7* 21.6*  PLT 324 313  339    Cardiac EnzymesNo results for input(s): TROPONINI in the last 168 hours. No results for input(s): TROPIPOC in the last 168 hours.   BNP Recent Labs  Lab 07/26/18 1653  BNP 1,180.8*     DDimer No results for input(s): DDIMER in the last 168 hours.   Radiology    Dg Chest Port 1 View  Result Date: 07/31/2018 CLINICAL DATA:  Acute respiratory failure. EXAM: PORTABLE CHEST 1 VIEW COMPARISON:  CT 07/26/2018.  Chest x-ray 07/25/2018, 05/17/2018. FINDINGS: Cardiomegaly. Diffuse bilateral pulmonary interstitial prominence again noted. These findings have progressed slightly from prior exam. Active interstitial process such as interstitial edema and/or pneumonitis could present this fashion. Unchanged cardiomegaly. No pleural effusion or pneumothorax. Prior thoracic vertebroplasties. IMPRESSION:  Cardiomegaly with diffuse bilateral from interstitial prominence again noted. These findings are progressed slightly from prior exam. CHF could present this fashion. Other etiologies of interstitial lung disease including pneumonitis can not be excluded. Electronically Signed   By: Marcello Moores  Register   On: 07/31/2018 06:43    Cardiac Studies   Echo 04/26/18  - Left ventricle: The cavity size was normal. There was severe   concentric hypertrophy. Systolic function was vigorous. The   estimated ejection fraction was in the range of 65% to 70%. Wall   motion was normal; there were no regional wall motion   abnormalities. The study is not technically sufficient to allow   evaluation of LV diastolic function. - Aortic valve: Sclerosis without stenosis. There was trivial   regurgitation. - Mitral valve: Calcified annulus. Mildly thickened leaflets .   There was trivial regurgitation. - Left atrium: Moderately dilated. - Right ventricle: The cavity size was mildly dilated. Mildly   reduced systolic function. - Tricuspid valve: There was moderate regurgitation. - Pulmonary arteries: PA peak pressure: 59 mm Hg (S). - Inferior vena cava: The vessel was normal in size. The   respirophasic diameter changes were in the normal range (>= 50%),   consistent with normal central venous pressure.  Impressions:  - LVEF 65-70%, severe concentric LV wall thickening - speckled   intraventricular septum suggestive of infiltrative cardiomyopathy   such as amyloidosis. Consider PYP nuclear scan. Grossly normal   wall motion, aortic sclerosis with trivial AI, MAC with trivial   MR, moderate LAE, mildly dilated RV with reduced systolic   function, moderate TR, RVSP 59 mmHg, normal IVC.  Patient Profile     84 y.o. female with complex PMH including atrial fibrillation (prior amiodarone treatment) on anticoagulation, carotid artery disease, diabetes, hyperlipidemia, hypertension, chronic respiratory failure  on home O2, possible amiodarone lung toxicity who is being followed for worsening dyspnea.  Assessment & Plan    Dyspnea: acute on chronic diastolic heart failure vs. Respiratory etiology. -given LVH, amyloid scan done, but negative for amyloid -had normal LV EF on prior echo. Pending R/LHC today for further evaluation. She was able to lie nearly flat in bed without desaturations. Does not appear volume overloaded on exam. She does require high flow nasal cannula to avoid desaturations, and she briefly desaturates with transfers and movement. Discussed with cath lab, she can be continued on high flow while in the cath lab. -my suspicion is that she will have elevated pulmonary pressures secondary to her lung disease, but LHC will potentially exclude CAD as cause and also assist in guiding antiarrhythmic therapy options.  History of atrial fibrillation: -holding apixaban, on heparin for cath -currently in NSR with PACs  Anemia: no gross active bleeding. Had one  firm stool last evening, per nursing report no blood. Anemia has been stable. Workup per primary team.  Chronic respiratory failure, on home O2, and possible amiodarone lung toxicity: per primary team/pulmonary -no amiodarone ever -steroids per primary/pulm  Chronic kidney disease: avoid LV gram during cath.  Hypertension: well controlled today  For questions or updates, please contact [place] Please consult [email or web address] for contact info under     Signed, Buford Dresser, MD  07/31/2018, 8:53 AM

## 2018-07-31 NOTE — Clinical Social Work Note (Signed)
Left voicemail for [facility] admissions coordinator asking her to review referral that was sent last week. Per CSW handoff, they are first preference.  Dayton Scrape, [place]

## 2018-07-31 NOTE — Progress Notes (Signed)
 Progress Note  Patient Name: Brooke Mullins Date of Encounter: 07/31/2018  Primary Cardiologist: Robert Krasowski, MD  Subjective   Slept well. Had one firm bowel movement after enema, no obvious blood. Breathing stable. Per nursing, when she moves or if she comes off high flow O2, she desaturates rapidly. She is able to lie nearly flat in bed on high flow and maintaining sats >95%. She has had cath discussed and is amenable.  Inpatient Medications    Scheduled Meds: . escitalopram  20 mg Oral QHS  . feeding supplement (ENSURE ENLIVE)  237 mL Oral BID BM  . insulin aspart  0-5 Units Subcutaneous QHS  . insulin aspart  0-9 Units Subcutaneous TID WC  . iron polysaccharides  150 mg Oral Daily  . levothyroxine  75 mcg Oral QAC breakfast  . mouth rinse  15 mL Mouth Rinse BID  . metoprolol tartrate  12.5 mg Oral BID  . multivitamin with minerals  1 tablet Oral Daily  . pantoprazole  40 mg Oral Daily  . polyethylene glycol  17 g Oral Daily  . sodium chloride flush  3 mL Intravenous Q12H   Continuous Infusions: . sodium chloride 10 mL/hr at 07/31/18 0555  . sodium chloride 10 mL/hr at 07/31/18 0600  . heparin 900 Units/hr (07/31/18 0600)  . methylPREDNISolone (SOLU-MEDROL) injection Stopped (07/31/18 0525)   PRN Meds: sodium chloride, acetaminophen **OR** acetaminophen, albuterol, Heparin (Porcine) in NaCl, HYDROcodone-acetaminophen, morphine injection, polyethylene glycol, polyvinyl alcohol, sodium chloride flush   Vital Signs    Vitals:   07/31/18 0303 07/31/18 0355 07/31/18 0725 07/31/18 0739  BP: (!) 132/54   (!) 161/76  Pulse: 77   75  Resp: 14   16  Temp: 98 F (36.7 C)   97.8 F (36.6 C)  TempSrc: Oral   Oral  SpO2: 97%  99% 100%  Weight:  61.8 kg    Height:        Intake/Output Summary (Last 24 hours) at 07/31/2018 0853 Last data filed at 07/31/2018 0600 Gross per 24 hour  Intake 1058.36 ml  Output 700 ml  Net 358.36 ml   Filed Weights   07/29/18 0500  07/30/18 0356 07/31/18 0355  Weight: 58.8 kg 59.5 kg 61.8 kg    Telemetry    Sinus rhythm, 1st degree AV block, frequent PACs - Personally Reviewed  ECG    No new since 12/22 - Personally Reviewed  Physical Exam   GEN: No acute distress.   Neck: JVD at clavicle at 30 degrees Cardiac: RRR, no murmurs, rubs, or gallops.  Respiratory: distant breath sounds with basilar fine crackles GI: Soft, nontender, non-distended  MS: trace LE bilateral edema; No deformity. Neuro:  Nonfocal  Psych: Normal affect   Labs    Chemistry Recent Labs  Lab 07/29/18 0204 07/30/18 0329 07/31/18 0228  NA 131* 133* 133*  K 3.9 4.1 4.4  CL 90* 93* 93*  CO2 26 28 28  GLUCOSE 262* 237* 225*  BUN 48* 46* 35*  CREATININE 1.33* 1.25* 0.94  CALCIUM 8.9 8.6* 8.7*  GFRNONAA 37* 40* 56*  GFRAA 43* 46* >60  ANIONGAP 15 12 12     Hematology Recent Labs  Lab 07/29/18 0204 07/30/18 0329 07/31/18 0228  WBC 10.8* 8.2 8.3  RBC 3.89 3.78* 3.67*  HGB 9.1* 8.8* 8.8*  HCT 29.2* 28.4* 27.9*  MCV 75.1* 75.1* 76.0*  MCH 23.4* 23.3* 24.0*  MCHC 31.2 31.0 31.5  RDW 22.2* 21.7* 21.6*  PLT 324 313   339    Cardiac EnzymesNo results for input(s): TROPONINI in the last 168 hours. No results for input(s): TROPIPOC in the last 168 hours.   BNP Recent Labs  Lab 07/26/18 1653  BNP 1,180.8*     DDimer No results for input(s): DDIMER in the last 168 hours.   Radiology    Dg Chest Port 1 View  Result Date: 07/31/2018 CLINICAL DATA:  Acute respiratory failure. EXAM: PORTABLE CHEST 1 VIEW COMPARISON:  CT 07/26/2018.  Chest x-ray 07/25/2018, 05/17/2018. FINDINGS: Cardiomegaly. Diffuse bilateral pulmonary interstitial prominence again noted. These findings have progressed slightly from prior exam. Active interstitial process such as interstitial edema and/or pneumonitis could present this fashion. Unchanged cardiomegaly. No pleural effusion or pneumothorax. Prior thoracic vertebroplasties. IMPRESSION:  Cardiomegaly with diffuse bilateral from interstitial prominence again noted. These findings are progressed slightly from prior exam. CHF could present this fashion. Other etiologies of interstitial lung disease including pneumonitis can not be excluded. Electronically Signed   By: Thomas  Register   On: 07/31/2018 06:43    Cardiac Studies   Echo 04/26/18  - Left ventricle: The cavity size was normal. There was severe   concentric hypertrophy. Systolic function was vigorous. The   estimated ejection fraction was in the range of 65% to 70%. Wall   motion was normal; there were no regional wall motion   abnormalities. The study is not technically sufficient to allow   evaluation of LV diastolic function. - Aortic valve: Sclerosis without stenosis. There was trivial   regurgitation. - Mitral valve: Calcified annulus. Mildly thickened leaflets .   There was trivial regurgitation. - Left atrium: Moderately dilated. - Right ventricle: The cavity size was mildly dilated. Mildly   reduced systolic function. - Tricuspid valve: There was moderate regurgitation. - Pulmonary arteries: PA peak pressure: 59 mm Hg (S). - Inferior vena cava: The vessel was normal in size. The   respirophasic diameter changes were in the normal range (>= 50%),   consistent with normal central venous pressure.  Impressions:  - LVEF 65-70%, severe concentric LV wall thickening - speckled   intraventricular septum suggestive of infiltrative cardiomyopathy   such as amyloidosis. Consider PYP nuclear scan. Grossly normal   wall motion, aortic sclerosis with trivial AI, MAC with trivial   MR, moderate LAE, mildly dilated RV with reduced systolic   function, moderate TR, RVSP 59 mmHg, normal IVC.  Patient Profile     84 y.o. female with complex PMH including atrial fibrillation (prior amiodarone treatment) on anticoagulation, carotid artery disease, diabetes, hyperlipidemia, hypertension, chronic respiratory failure  on home O2, possible amiodarone lung toxicity who is being followed for worsening dyspnea.  Assessment & Plan    Dyspnea: acute on chronic diastolic heart failure vs. Respiratory etiology. -given LVH, amyloid scan done, but negative for amyloid -had normal LV EF on prior echo. Pending R/LHC today for further evaluation. She was able to lie nearly flat in bed without desaturations. Does not appear volume overloaded on exam. She does require high flow nasal cannula to avoid desaturations, and she briefly desaturates with transfers and movement. Discussed with cath lab, she can be continued on high flow while in the cath lab. -my suspicion is that she will have elevated pulmonary pressures secondary to her lung disease, but LHC will potentially exclude CAD as cause and also assist in guiding antiarrhythmic therapy options.  History of atrial fibrillation: -holding apixaban, on heparin for cath -currently in NSR with PACs  Anemia: no gross active bleeding. Had one   firm stool last evening, per nursing report no blood. Anemia has been stable. Workup per primary team.  Chronic respiratory failure, on home O2, and possible amiodarone lung toxicity: per primary team/pulmonary -no amiodarone ever -steroids per primary/pulm  Chronic kidney disease: avoid LV gram during cath.  Hypertension: well controlled today  For questions or updates, please contact [facility] Please consult [email or web address] for contact info under     Signed, Maven Varelas, MD  07/31/2018, 8:53 AM   

## 2018-07-31 NOTE — Progress Notes (Signed)
Rectal exam with stool upper rectum no masses, stool sent to lab for hemoccult

## 2018-08-01 ENCOUNTER — Encounter (HOSPITAL_COMMUNITY): Payer: Self-pay | Admitting: Interventional Cardiology

## 2018-08-01 DIAGNOSIS — R0609 Other forms of dyspnea: Secondary | ICD-10-CM

## 2018-08-01 LAB — GLUCOSE, CAPILLARY
Glucose-Capillary: 236 mg/dL — ABNORMAL HIGH (ref 70–99)
Glucose-Capillary: 335 mg/dL — ABNORMAL HIGH (ref 70–99)
Glucose-Capillary: 279 mg/dL — ABNORMAL HIGH (ref 70–99)
Glucose-Capillary: 219 mg/dL — ABNORMAL HIGH (ref 70–99)

## 2018-08-01 LAB — BASIC METABOLIC PANEL
Anion gap: 13 (ref 5–15)
BUN: 32 mg/dL — AB (ref 8–23)
CO2: 26 mmol/L (ref 22–32)
Calcium: 8.4 mg/dL — ABNORMAL LOW (ref 8.9–10.3)
Chloride: 96 mmol/L — ABNORMAL LOW (ref 98–111)
Creatinine, Ser: 0.81 mg/dL (ref 0.44–1.00)
GFR calc Af Amer: >60 mL/min (ref >60)
GFR calc non Af Amer: >60 mL/min (ref >60)
Glucose, Bld: 258 mg/dL — ABNORMAL HIGH (ref 70–99)
Potassium: 4.4 mmol/L (ref 3.5–5.1)
Sodium: 135 mmol/L (ref 135–145)

## 2018-08-01 LAB — CBC
HCT: 27.3 % — ABNORMAL LOW (ref 36.0–46.0)
Hemoglobin: 8.3 g/dL — ABNORMAL LOW (ref 12.0–15.0)
MCH: 23.3 pg — ABNORMAL LOW (ref 26.0–34.0)
MCHC: 30.4 g/dL (ref 30.0–36.0)
MCV: 76.7 fL — AB (ref 80.0–100.0)
Platelets: 285 10*3/uL (ref 150–400)
RBC: 3.56 MIL/uL — ABNORMAL LOW (ref 3.87–5.11)
RDW: 21.6 % — ABNORMAL HIGH (ref 11.5–15.5)
WBC: 6.2 10*3/uL (ref 4.0–10.5)
nRBC: 0 % (ref 0.0–0.2)

## 2018-08-01 LAB — APTT: aPTT: 58 seconds — ABNORMAL HIGH (ref 24–36)

## 2018-08-01 LAB — HEPARIN LEVEL (UNFRACTIONATED): Heparin Unfractionated: 0.94 IU/mL — ABNORMAL HIGH (ref 0.30–0.70)

## 2018-08-01 MED ORDER — APIXABAN 2.5 MG PO TABS: 2.5000 mg | ORAL_TABLET | Freq: Two times a day (BID) | ORAL | Status: DC

## 2018-08-01 MED ORDER — INSULIN GLARGINE 100 UNIT/ML ~~LOC~~ SOLN
10.0000 [IU] | Freq: Every day | SUBCUTANEOUS | Status: DC
  Administered 2018-08-01: 10 [IU] via SUBCUTANEOUS
  Filled 2018-08-01: qty 0.1

## 2018-08-01 MED ORDER — PREDNISONE 50 MG PO TABS: 80.0000 mg | ORAL_TABLET | Freq: Every day | ORAL | Status: DC

## 2018-08-01 MED ORDER — ROSUVASTATIN CALCIUM 5 MG PO TABS
5.0000 mg | ORAL_TABLET | Freq: Every day | ORAL | Status: DC
  Administered 2018-08-01 – 2018-08-03 (×3): 5 mg via ORAL
  Filled 2018-08-01 (×3): qty 1

## 2018-08-01 MED ORDER — APIXABAN 2.5 MG PO TABS
2.5000 mg | ORAL_TABLET | Freq: Two times a day (BID) | ORAL | Status: DC
  Administered 2018-08-01 – 2018-08-04 (×6): 2.5 mg via ORAL
  Filled 2018-08-01 (×7): qty 1

## 2018-08-01 MED ORDER — PREDNISONE 50 MG PO TABS
60.0000 mg | ORAL_TABLET | Freq: Every day | ORAL | Status: DC
  Administered 2018-08-02 – 2018-08-04 (×3): 60 mg via ORAL
  Filled 2018-08-01 (×3): qty 1

## 2018-08-01 NOTE — Progress Notes (Signed)
   08/01/18 1208  Vitals  Pulse Rate 63  ECG Heart Rate 74  Resp (!) 24  Oxygen Therapy  SpO2 (!) 85 %  O2 Device HFNC  O2 Flow Rate (L/min) 25 L/min  notified RT pt desating, increased to 25l/min

## 2018-08-01 NOTE — Progress Notes (Signed)
NAME:  Brooke [place], MRN:  [id number], DOB:  October 30, 1933, LOS: 9 ADMISSION DATE:  07/23/2018, CONSULTATION DATE:  07/27/18 REFERRING MD:  Wynelle [place]  CHIEF COMPLAINT:  SOB   Brief History   Brooke [place]ins is [place] 84 y.o. female who was admitted 12/22 after fall.  Found to have right hip fx's that are being managed conservatively. PCCM called 12/26 due to high FiO2 needs due to possible underlying IPF (see discussion in HPI regarding IPF diagnosis).  ILD HX    Integrated Comprehensive ILD Questionnaire - family and patient filled this out and this is available currently   Symptoms:    Started amiodarone tab in may 2019. Resp issues developed after that. Major hypoxemia episode was sept 2019. Dyspnea progressive.  Level 2 dyspnea at rest.  Level for dyspnea for activities of daily living and level 5 dyspnea for walking.  There is associated cough.  The cough also started in 2019 along with the shortness of breath.  It is moderate in intensity.  She coughs at night.  There is yellow phlegm.  The coughing is affecting the voice.   in November 2019, she saw Dr. Bernette Mayers in [place] who "used [place] camera to go down and take pieces from my lung".  She was told that she had IPF and was started on 20mg  prednisone daily.  Ofev has been approived but not started    Past Medical History : Heart failure with atrial fibrillation.  On amiodarone since May 2019.  No collagen vascular disease.  No seizures.  Reported diagnosis of thyroid disease since July or August 2019 again postdating amiodarone.  No stroke or seizures.  No hepatitis.  No pneumonia history.  No blood clots.  No pleurisy.   ROS: Does have fatigue but no arthralgia.  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  After the fall in June she is lost 34 pounds.  No nausea vomiting rash   FAMILY HISTORY of LUNG DISEASE: Her father and sister had COPD but she denies any pulmonary fibrosis   EXPOSURE  HISTORY: She smoked cigarettes 1 pack [place] day.  Do not know when she started but she quit in 1994.  There is no passive smoking exposure.  No marijuana or vapor cocaine or intravenous drug use exposure   HOME and HOBBY DETAILS : [place].  Age of the home is 100 years.  There is no dampness.  There is normal level of mildew in the shower curtain on occasion.  She does use the bathroom where there is mildew.  Does not use [place] humidifier.  No CPAP mask at home.  Does have [place] nebulizer machine but no contamination.  She does have [place] steam iron but no contamination.  No pet birds or parakeets of feather pillow.  No mold in the [facility] duct.  No gardening or music habits.   OCCUPATIONAL HISTORY (122 questions) : Positive for previously working in [place] environment and she is worked in the [employer] before she retired.  During this time the environment was dusty   PULMONARY TOXICITY HISTORY (27 items): She has been on amiodarone since May 2019.  And currently is in November 2019 on prednisone 20 mg/day  PULMONARY TOXICITY HISTORY (27 items): She has been on amiodarone since May 2019.  And currently is in November 2019 on prednisone 20 mg/day Results for Brooke Mullins, Brooke Mullins (  MRN [id number]) as of 07/30/2018 12:45  Ref. Range 07/27/2018 13:26  ANA Ab, IFA Unknown Negative  ANCA Proteinase 3 Latest Ref Range: 0.0 - 3.5 U/mL <3.5  CCP Antibodies IgG/IgA Latest Ref Range: 0 - 19 units 8  ds DNA Ab Latest Ref Range: 0 - 9 IU/mL <1  GBM Ab Latest Ref Range: 0 - 20 units 3  Myeloperoxidase Abs Latest Ref Range: 0.0 - 9.0 U/mL <9.0  RA Latex Turbid. Latest Ref Range: 0.0 - 13.9 IU/mL 46.2 (H)  SSA (Ro) (ENA) Antibody, IgG Latest Ref Range: 0.0 - 0.9 AI <0.2  SSB (La) (ENA) Antibody, IgG Latest Ref Range: 0.0 - 0.9 AI <0.2  Scleroderma (Scl-70) (ENA) Antibody, IgG Latest Ref Range: 0.0 - 0.9 AI <0.2  Results for Brooke Mullins, Brooke Mullins (MRN [id number]) as of 07/30/2018  12:45  Ref. Range 07/27/2018 13:26  Sed Rate Latest Ref Range: 0 - 22 mm/hr 72 (H)   HP panel negative  Significant Hospital Events   12/22 > admit. 12/26 > PCCM consult. Steroids increased to 60mg  Q6h.  RVP - negative, Urine strep negzativeSed rate 70s and RF mildly elevated . Rest negative. HP panel pending 12/27: negative 2.4 liters. . ILD questionnaire - Amio intake +, Engineer, manufacturing systems +. Mild mildew/mold exppsure in bathroom in 82 year old home, prior smoker 12/28 - Remains on HFNC 75%,. Easy desats into the 70s with movement. Not using night bipap. Steroids increased to 1gm/day 12/30 - LHC/RHC planned today  Consults:  Orthopedics. PCCM 12/26>>  Procedures:  None.  Significant Diagnostic Tests:  CT chest 12/25 > Diffuse GGO's with bronchiectasis.  New compression fx of T4 vertebral body. LHC 12/31 > 3v CAD RHC 12/31 > PA 24mmHg, PCWP 6mmHg, CO 7.2L/min  Micro Data:  Sputum 12/26 >   Antimicrobials:  None.   Interim history/subjective:  S/p LHC and RHC. Reports her breathing is better however still has significant dyspnea and desaturations with minimal exertion. Denies chest pain.  At rest oxygen saturations appropriate with FiO2 40 to 50% which is improved.  Objective:  Blood pressure (!) 141/61, pulse 63, temperature 98.3 F (36.8 C), temperature source Oral, resp. rate (!) 22, height 5\' 4"  (1.626 m), weight 58.9 kg, SpO2 91 %.    FiO2 (%):  [40 %-70 %] 40 %   Intake/Output Summary (Last 24 hours) at 08/01/2018 1832 Last data filed at 08/01/2018 1239 Gross per 24 hour  Intake 1163.84 ml  Output 350 ml  Net 813.84 ml   Filed Weights   07/30/18 0356 07/31/18 0355 08/01/18 0500  Weight: 59.5 kg 61.8 kg 58.9 kg   Physical Exam: General: Frail appearing female laying in bed, no acute distress HENT: [place], AT, OP clear, on HF [place] Eyes: EOMI, no scleral icterus Respiratory: Fine bilateral crackles.  No wheezing or rhonchi Cardiovascular: RRR, -M/R/G, no JVD GI:  BS+, soft, nontender Extremities:-Edema,-tenderness Neuro: AAO x4, CNII-XII grossly intact Skin: Intact, no rashes or bruising Psych: Normal mood, normal affect GU: Foley in place  Assessment & Plan:  83 year old female who initially presented with fall and found with right hip fracture.  PCCM consulted for hypoxemia.  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  Other etiologies considered include undiagnosed UIP or subacute hypersensitive pneumonitis.  Resting oxygen requirements improving however continues to require high FiO2 with exertion.  Yesterday LHC demonstrates  three-vessel CAD with recommendations from cardiology to medically manage.  RHC suggestive of mild pulmonary hypertension.  PLAN -S/p solumedrol at 1g/day x 48h.  -Started prednisone 1 meg per cake today with plan to slow taper over the next 2 to 4 months  -Continue supplemental oxygen to maintain saturations 88 to 92%      I have discussed plan of care with nursing and RT to wean O2 as tolerated during rest.  Staff aware to titrate up O2 requirements prior to activity.   -Recommend diuresis as tolerated with goal net even daily -Amiodarone has been discontinued.  Recommend against restarting. -No role for Ofev/esbriet (patient has this ordered through Dr Alcide Clever) for now -We will arrange with follow-up in ILD clinic with Dr. Chase Caller to be considered for ILD PRO study as an outpatient  Best Practice:  Diet: Regular Pain/Anxiety/Delirium protocol (if indicated): N/[place]. VAP protocol (if indicated): N/[place]. DVT prophylaxis: SCD's / apixaban. GI prophylaxis: Pantoprazole. Glucose control: N/[place]ins. Mobility: Bedrest. Code Status: DNR. Family Communication: patient and niece Disposition: Progressive  Care. [facility]  The patient is critically ill with multiple organ systems failure and requires high complexity decision making for assessment and support, frequent evaluation and titration of therapies, application of advanced monitoring technologies and extensive interpretation of multiple databases.   Critical Care Time devoted to patient care services described in this note is  53 Minutes. This time reflects time of care of this signee Dr. Rodman Pickle. This critical care time does not reflect procedure time, or teaching time or supervisory time of PA/NP/Med student/Med Resident etc but could involve care discussion time.  Rodman Pickle, M.D. [facility] Pulmonary/Critical Care Medicine. Pager: [phone]. After hours pager: [phone].

## 2018-08-01 NOTE — Progress Notes (Signed)
Increased flow and O2 due to low spo2.

## 2018-08-01 NOTE — Plan of Care (Signed)
  Problem: Health Behavior/Discharge Planning: Goal: Ability to manage health-related needs will improve Outcome: Progressing   Problem: Clinical Measurements: Goal: Ability to maintain clinical measurements within normal limits will improve Outcome: Progressing Goal: Will remain free from infection Outcome: Progressing Goal: Diagnostic test results will improve Outcome: Progressing Goal: Respiratory complications will improve Outcome: Progressing Goal: Cardiovascular complication will be avoided Outcome: Progressing   Problem: Activity: Goal: Risk for activity intolerance will decrease Outcome: Progressing   Problem: Nutrition: Goal: Adequate nutrition will be maintained Outcome: Progressing   Problem: Coping: Goal: Level of anxiety will decrease Outcome: Progressing   Problem: Elimination: Goal: Will not experience complications related to bowel motility Outcome: Progressing Goal: Will not experience complications related to urinary retention Outcome: Progressing   Problem: Pain Managment: Goal: General experience of comfort will improve Outcome: Progressing   Problem: Safety: Goal: Ability to remain free from injury will improve Outcome: Progressing   Problem: Skin Integrity: Goal: Risk for impaired skin integrity will decrease Outcome: Progressing   Problem: Activity: Goal: Ability to return to baseline activity level will improve Outcome: Progressing   Problem: Cardiovascular: Goal: Ability to achieve and maintain adequate cardiovascular perfusion will improve Outcome: Progressing Goal: Vascular access site(s) Level 0-1 will be maintained Outcome: Progressing   Problem: Health Behavior/Discharge Planning: Goal: Ability to safely manage health-related needs after discharge will improve Outcome: Progressing

## 2018-08-01 NOTE — Progress Notes (Signed)
ANTICOAGULATION CONSULT NOTE - Follow Up Consult  Pharmacy Consult for IV heparin (Eliquis on hold) Indication: atrial fibrillation  Allergies  Allergen Reactions  . Penicillins Anaphylaxis and Other (See Comments)    Has patient had a PCN reaction causing immediate rash, facial/tongue/throat swelling, SOB or lightheadedness with hypotension: Yes Has patient had a PCN reaction causing severe rash involving mucus membranes or skin necrosis: No Has patient had a PCN reaction that required hospitalization: No Has patient had a PCN reaction occurring within the last 10 years: No If all of the above answers are "NO", then may proceed with Cephalosporin use.   . Atorvastatin Other (See Comments)    Muscle pain  . Codeine Other (See Comments)    Nausea/Vomiting  . Colesevelam Other (See Comments)    Unsure of exact reaction type  . Ezetimibe Other (See Comments)    Pain in legs    Patient Measurements: Height: 5\' 4"  (162.6 cm) Weight: 129 lb 13.6 oz (58.9 kg) IBW/kg (Calculated) : 54.7 Heparin Dosing Weight: 56.7 kg  Vital Signs: Temp: 97.8 F (36.6 C) (12/31 0716) Temp Source: Oral (12/31 0716) BP: 152/74 (12/31 0716) Pulse Rate: 60 (12/31 0716)  Labs: Recent Labs    07/30/18 0329  07/30/18 1938 07/31/18 0228 08/01/18 0313 08/01/18 0756  HGB 8.8*  --   --  8.8* 8.3*  --   HCT 28.4*  --   --  27.9* 27.3*  --   PLT 313  --   --  339 285  --   APTT 104*   < > 113* 96*  --  58*  HEPARINUNFRC 1.34*  --   --  1.22*  --  0.94*  CREATININE 1.25*  --   --  0.94 0.81  --    < > = values in this interval not displayed.    Estimated Creatinine Clearance: 46.2 mL/min (by C-G formula based on SCr of 0.81 mg/dL).   Medical History: Past Medical History:  Diagnosis Date  . Atrial fibrillation ([place])   . Carotid artery stenosis with cerebral infarction ([place])   . Carotid atherosclerosis, bilateral   . Chronic bronchitis ([place])   . Depression   . Dyslipidemia   . Edema   .  History of blood transfusion 04/24/2018   "low HgB" (04/25/2018)  . Hyperplastic colon polyp   . Hypertension   . Hypothyroidism   . Osteoporosis   . Peripheral vascular disease of extremity ([place])   . PSVT (paroxysmal supraventricular tachycardia) ([place])   . Skin cancer    "scraped off my neck and legs" (04/25/2018)  . Subclavian artery stenosis ([facility])     Medications:  Infusions:  . sodium chloride 10 mL/hr at 08/01/18 0000  . heparin 900 Units/hr (08/01/18 0400)  . methylPREDNISolone (SOLU-MEDROL) injection 250 mg (08/01/18 0542)    Assessment: 84 yo on chronic Eliquis for afib PTA, s/p L/RHC finding severe 3v CAD. Possible CABG vs medical Rx. Heparin continues -aPTT= 58, heparin level - 0.94    Goal of Therapy:  Heparin level 0.3-0.7 units/ml  APTT 66-102s Monitor platelets by anticoagulation protocol: Yes   Plan:  -Increase heparin to 1000 units/hr -daily heparin level, aPTT and CBC  Hildred Laser, PharmD Clinical Pharmacist **Pharmacist phone directory can now be found on [email or web address] ([email or web address]).  Listed under [place].

## 2018-08-01 NOTE — Progress Notes (Signed)
PROGRESS NOTE    Brooke Mullins   GQB:[id number]  DOB: 1934/07/01  DOA: 07/23/2018 PCP: Park Liter, MD   Brief Narrative:  [name] 84 year old female with history of recently diagnosed pulmonary fibrosis chronic hypoxic respiratory failure on 2 L nasal cannula just prior to admission, persistent A- fib, secondary pulm HTN, PVD came to the ER after fall at home.    Admitted from 04/25/18- 04/28/18 to the cardiology service (as a transfer from [place]) for dyspnea with a productive cough, hypoxia needing a BiPAP. She was diuresed and was weaned down to about 3-4 l O2 when discharged.  She followed up later with a pulmonologist in [place], Dr Bernette Mayers, underwent a lung biopsy and was diagnosed with IPF and started on 20 mg of Prednisone daily. She was also prescribed another medications which they are trying to get approved through her insurance.  She had noted more shortness of breath the day before the fall that brought her to the ED.   In the ER, she was found to have right pelvic fracture, was admitted for pain control & nonoperative management. H and P notes that her pulse ox was 81% the day before.  I started to see her on 12/25 and noted she was on 100% FiO2 via a Face mask. It was noted the night before the she had become more hypoxic. A dose of IV Solumedrol and one of IV Lasix were given. CXR showed interstitial pulmonary edema.  Subjective: She has no complaints today    Assessment & Plan:   Principal Problem:   Acute on chronic respiratory failure with hypoxia  H/o pulmonary fibrosis on Prednisone - last admitted in Sept with acute resp failure from CHF, ECHO showed severe concentric LV wall thickening with speckled intraventricular septum suggestive of infiltrative cardiomyopathy such as amyloidosis.  - CXR suggestive of pulm edema- given multiple doses of Lasix   - 12/25- not improving- checked chest CT- showed b/l ground glass infiltrates- checked pro calcitonin  which was negative - transfered to SDU for BiPAP on 12/25- given further Lasix    - 12/26 - not improving- cardiology and pulmonary consults requested- suspecting Amio induced pulmonary toxicity - Amio stopped and IV steroids started by Pulm-  - Per pulm today > 'Start prednisone at 1mg /kg (max dose 80mg  per day) on 12/31 and then slow taper over 8-16 weeks to off- Continue supplemental oxygen to maintain saturations 88 to 92%-No role for Ofev/esbriet (patient has this ordered through Dr Alcide Clever) for now'  -pulmonary plans to make a f/u in ILD clinic with Dr Chase Caller to be considered for ILD PRO study   Active Problems:     Persistent atrial fibrillation - cont Metoprolol, Amio, Heparin infusion- Apixaban on hold- management per cardiology   Mild AKI   - baseline Cr ~ 0.8-0.9- Cr currently 1.33 with elevated BUN likely a bit dry due to lasix- - Cr improved      Infiltrative cardiomyopathy?-   chronic diastolic CHF/ secondary pulm HTN - see above in regards to diuretics - cardiology following - TTR Amyloid scan neg for amyloid per Cardiology - right and left heart cath shows mild pulm HTN, multivessel, severe calcific three vessel disease     Pelvic fracture/   Fall at home, initial encounter - pain control- will be going to SNF in [place]    Hypothyroidism - Synthroid  Constipated - cont laxatives  DVT prophylaxis: Eliquis Code Status: DNR Family Communication: daughter and son Disposition Plan:  Follow in SDU Consultants:   PCCM  Cardiology Procedures:  2 D ECHO  Ost RCA lesion is 90% stenosed.  Mid LM lesion is 50% stenosed.  Ost 1st Mrg lesion is 90% stenosed.  Ost 2nd Mrg lesion is 90% stenosed.  Mid LAD lesion is 80% stenosed.  LV end diastolic pressure is normal. LVEDP 9 mm Hg.  There is no aortic valve stenosis.  Hemodynamic findings consistent with mild pulmonary hypertension.  Ao sat 100%, PA sat 75%, mean PA 26 mm Hg; mean PCWP 10 mm Hg; CO 7.2  L/min; CI 4.4   Severe calcific three vessel CAD. Antimicrobials:  Anti-infectives (From admission, onward)   None       Objective: Vitals:   08/01/18 1144 08/01/18 1208 08/01/18 1214 08/01/18 1300  BP:      Pulse:  63    Resp:  (!) 24    Temp:      TempSrc:      SpO2: 92% (!) 85% 94% 100%  Weight:      Height:        Intake/Output Summary (Last 24 hours) at 08/01/2018 1524 Last data filed at 08/01/2018 1239 Gross per 24 hour  Intake 1598.84 ml  Output 350 ml  Net 1248.84 ml   Filed Weights   07/30/18 0356 07/31/18 0355 08/01/18 0500  Weight: 59.5 kg 61.8 kg 58.9 kg    Examination: General exam: Appears comfortable  HEENT: PERRLA, oral mucosa moist, no sclera icterus or thrush Respiratory system: continues to have b/l crackles- on 50 % Fio2 high flow [place]. Respiratory effort normal. Cardiovascular system: S1 & S2 heard,  No murmurs  Gastrointestinal system: Abdomen soft, non-tender, nondistended. Normal bowel sound. No organomegaly Central nervous system: Alert and oriented. No focal neurological deficits. Extremities: No cyanosis, clubbing or edema Skin: No rashes or ulcers Psychiatry:  Mood & affect appropriate.     Data Reviewed: I have personally reviewed following labs and imaging studies  CBC: Recent Labs  Lab 07/28/18 0246 07/29/18 0204 07/30/18 0329 07/31/18 0228 08/01/18 0313  WBC 8.0 10.8* 8.2 8.3 6.2  HGB 9.4* 9.1* 8.8* 8.8* 8.3*  HCT 30.5* 29.2* 28.4* 27.9* 27.3*  MCV 75.7* 75.1* 75.1* 76.0* 76.7*  PLT 289 324 313 339 196   Basic Metabolic Panel: Recent Labs  Lab 07/28/18 0246 07/29/18 0204 07/30/18 0329 07/31/18 0228 08/01/18 0313  NA 131* 131* 133* 133* 135  K 4.6 3.9 4.1 4.4 4.4  CL 89* 90* 93* 93* 96*  CO2 27 26 28 28 26   GLUCOSE 252* 262* 237* 225* 258*  BUN 28* 48* 46* 35* 32*  CREATININE 1.06* 1.33* 1.25* 0.94 0.81  CALCIUM 8.8* 8.9 8.6* 8.7* 8.4*   GFR: Estimated Creatinine Clearance: 46.2 mL/min (by C-G formula based  on SCr of 0.81 mg/dL). Liver Function Tests: No results for input(s): AST, ALT, ALKPHOS, BILITOT, PROT, ALBUMIN in the last 168 hours. Recent Labs  Lab 07/27/18 1326  LIPASE 52*   No results for input(s): AMMONIA in the last 168 hours. Coagulation Profile: No results for input(s): INR, PROTIME in the last 168 hours. Cardiac Enzymes: Recent Labs  Lab 07/27/18 1326  CKTOTAL 14*  CKMB 1.1   BNP (last 3 results) Recent Labs    05/12/18 1020  PROBNP 2,040*   HbA1C: No results for input(s): HGBA1C in the last 72 hours. CBG: Recent Labs  Lab 07/31/18 1228 07/31/18 1705 07/31/18 2122 08/01/18 0746 08/01/18 1206  GLUCAP 230* 250* 298* 236* 335*   Lipid  Profile: No results for input(s): CHOL, HDL, LDLCALC, TRIG, CHOLHDL, LDLDIRECT in the last 72 hours. Thyroid Function Tests: No results for input(s): TSH, T4TOTAL, FREET4, T3FREE, THYROIDAB in the last 72 hours. Anemia Panel: No results for input(s): VITAMINB12, FOLATE, FERRITIN, TIBC, IRON, RETICCTPCT in the last 72 hours. Urine analysis:    Component Value Date/Time   COLORURINE YELLOW (A) 07/23/2018 1516   APPEARANCEUR CLEAR (A) 07/23/2018 1516   LABSPEC 1.020 07/23/2018 1516   PHURINE 7.0 07/23/2018 1516   GLUCOSEU NEGATIVE 07/23/2018 1516   HGBUR NEGATIVE 07/23/2018 1516   BILIRUBINUR NEGATIVE 07/23/2018 1516   KETONESUR NEGATIVE 07/23/2018 1516   PROTEINUR NEGATIVE 07/23/2018 1516   NITRITE NEGATIVE 07/23/2018 1516   LEUKOCYTESUR POSITIVE (A) 07/23/2018 1516   Sepsis Labs: @LABRCNTIP (procalcitonin:4,lacticidven:4) ) Recent Results (from the past 240 hour(s))  Respiratory Panel by PCR     Status: None   Collection Time: 07/27/18 12:58 PM  Result Value Ref Range Status   Adenovirus NOT DETECTED NOT DETECTED Final   Coronavirus 229E NOT DETECTED NOT DETECTED Final   Coronavirus HKU1 NOT DETECTED NOT DETECTED Final   Coronavirus NL63 NOT DETECTED NOT DETECTED Final   Coronavirus OC43 NOT DETECTED NOT  DETECTED Final   Metapneumovirus NOT DETECTED NOT DETECTED Final   Rhinovirus / Enterovirus NOT DETECTED NOT DETECTED Final   Influenza A NOT DETECTED NOT DETECTED Final   Influenza B NOT DETECTED NOT DETECTED Final   Parainfluenza Virus 1 NOT DETECTED NOT DETECTED Final   Parainfluenza Virus 2 NOT DETECTED NOT DETECTED Final   Parainfluenza Virus 3 NOT DETECTED NOT DETECTED Final   Parainfluenza Virus 4 NOT DETECTED NOT DETECTED Final   Respiratory Syncytial Virus NOT DETECTED NOT DETECTED Final   Bordetella pertussis NOT DETECTED NOT DETECTED Final   Chlamydophila pneumoniae NOT DETECTED NOT DETECTED Final   Mycoplasma pneumoniae NOT DETECTED NOT DETECTED Final    Comment: Performed at [facility], [place] [address] [place], [place] [address]         Radiology Studies: Dg Chest Port 1 View  Result Date: 07/31/2018 CLINICAL DATA:  Acute respiratory failure. EXAM: PORTABLE CHEST 1 VIEW COMPARISON:  CT 07/26/2018.  Chest x-ray 07/25/2018, 05/17/2018. FINDINGS: Cardiomegaly. Diffuse bilateral pulmonary interstitial prominence again noted. These findings have progressed slightly from prior exam. Active interstitial process such as interstitial edema and/or pneumonitis could present this fashion. Unchanged cardiomegaly. No pleural effusion or pneumothorax. Prior thoracic vertebroplasties. IMPRESSION: Cardiomegaly with diffuse bilateral from interstitial prominence again noted. These findings are progressed slightly from prior exam. CHF could present this fashion. Other etiologies of interstitial lung disease including pneumonitis can not be excluded. Electronically Signed   By: [place]   On: 07/31/2018 06:43      Scheduled Meds: . escitalopram  20 mg Oral QHS  . feeding supplement (ENSURE ENLIVE)  237 mL Oral BID BM  . insulin aspart  0-15 Units Subcutaneous TID WC  . insulin aspart  0-5 Units Subcutaneous QHS  . insulin glargine  10 Units Subcutaneous QHS  . iron  polysaccharides  150 mg Oral Daily  . levothyroxine  75 mcg Oral QAC breakfast  . mouth rinse  15 mL Mouth Rinse BID  . metoprolol tartrate  12.5 mg Oral BID  . multivitamin with minerals  1 tablet Oral Daily  . pantoprazole  40 mg Oral Daily  . polyethylene glycol  17 g Oral Daily  . sodium chloride flush  3 mL Intravenous Q12H   Continuous Infusions: .  sodium chloride 10 mL/hr at 08/01/18 0000  . heparin 1,000 Units/hr (08/01/18 1126)  . methylPREDNISolone (SOLU-MEDROL) injection 250 mg (08/01/18 1148)     LOS: 9 days    Time spent in minutes: 35    Debbe Odea, MD [facility] Pager: [email or web address] Password [email or web address] 08/01/2018, 3:24 PM

## 2018-08-01 NOTE — Progress Notes (Signed)
**Note Brooke-Identified via Obfuscation** Progress Note  Patient Name: Brooke Mullins Date of Encounter: 08/01/2018  Primary Cardiologist: [name], MD  Subjective   Resting comfortably. Reviewed results of cath. She does not wish to pursue aggressive treatment and wants medical management. No chest pain. No change in breathing.  Inpatient Medications    Scheduled Meds: . escitalopram  20 mg Oral QHS  . feeding supplement (ENSURE ENLIVE)  237 mL Oral BID BM  . insulin aspart  0-15 Units Subcutaneous TID WC  . insulin aspart  0-5 Units Subcutaneous QHS  . iron polysaccharides  150 mg Oral Daily  . levothyroxine  75 mcg Oral QAC breakfast  . mouth rinse  15 mL Mouth Rinse BID  . metoprolol tartrate  12.5 mg Oral BID  . multivitamin with minerals  1 tablet Oral Daily  . pantoprazole  40 mg Oral Daily  . polyethylene glycol  17 g Oral Daily  . sodium chloride flush  3 mL Intravenous Q12H   Continuous Infusions: . sodium chloride 10 mL/hr at 08/01/18 0000  . heparin 900 Units/hr (08/01/18 0400)  . methylPREDNISolone (SOLU-MEDROL) injection 250 mg (08/01/18 0542)   PRN Meds: sodium chloride, acetaminophen **OR** acetaminophen, acetaminophen, albuterol, HYDROcodone-acetaminophen, morphine injection, ondansetron (ZOFRAN) IV, polyethylene glycol, polyvinyl alcohol, sodium chloride flush   Vital Signs    Vitals:   08/01/18 0400 08/01/18 0500 08/01/18 0716 08/01/18 0739  BP: (!) 147/73  (!) 152/74   Pulse: 66  60   Resp: 14  17   Temp: 98 F (36.7 C)  97.8 F (36.6 C)   TempSrc: Oral  Oral   SpO2: 97%  99% 99%  Weight:  58.9 kg    Height:        Intake/Output Summary (Last 24 hours) at 08/01/2018 1011 Last data filed at 08/01/2018 0900 Gross per 24 hour  Intake 1126 ml  Output 350 ml  Net 776 ml   Filed Weights   07/30/18 0356 07/31/18 0355 08/01/18 0500  Weight: 59.5 kg 61.8 kg 58.9 kg    Telemetry    Sinus rhythm, 1st degree AV block, frequent PACs, LBBB - Personally Reviewed  ECG    No  new since 12/22 - Personally Reviewed  Physical Exam   GEN: No acute distress.  High flow [place] in place. Neck: JVD at clavicle at 30 degrees Cardiac: RRR, no murmurs, rubs, or gallops.  Respiratory: distant breath sounds with basilar fine crackles GI: Soft, nontender, non-distended  MS: trace LE bilateral edema; No deformity. Neuro:  Nonfocal  Psych: Normal affect   Labs    Chemistry Recent Labs  Lab 07/30/18 0329 07/31/18 0228 08/01/18 0313  NA 133* 133* 135  K 4.1 4.4 4.4  CL 93* 93* 96*  CO2 _0 GLUCOSE 237* 225* 258*  BUN 46* 35* 32*  CREATININE 1.25* 0.94 0.81  CALCIUM 8.6* 8.7* 8.4*  GFRNONAA 40* 56* >60  GFRAA 46* >60 >60  ANIONGAP _1 Hematology Recent Labs  Lab 07/30/18 0329 07/31/18 0228 08/01/18 0313  WBC 8.2 8.3 6.2  RBC 3.78* 3.67* 3.56*  HGB 8.8* 8.8* 8.3*  HCT 28.4* 27.9* 27.3*  MCV 75.1* 76.0* 76.7*  MCH 23.3* 24.0* 23.3*  MCHC 31.0 31.5 30.4  RDW 21.7* 21.6* 21.6*  PLT 313 339 285    Cardiac EnzymesNo results for input(s): TROPONINI in the last 168 hours. No results for input(s): TROPIPOC in the last 168 hours.   BNP Recent Labs  Lab  07/26/18 1653  BNP 1,180.8*     DDimer No results for input(s): DDIMER in the last 168 hours.   Radiology    Dg Chest Port 1 View  Result Date: 07/31/2018 CLINICAL DATA:  Acute respiratory failure. EXAM: PORTABLE CHEST 1 VIEW COMPARISON:  CT 07/26/2018.  Chest x-ray 07/25/2018, 05/17/2018. FINDINGS: Cardiomegaly. Diffuse bilateral pulmonary interstitial prominence again noted. These findings have progressed slightly from prior exam. Active interstitial process such as interstitial edema and/or pneumonitis could present this fashion. Unchanged cardiomegaly. No pleural effusion or pneumothorax. Prior thoracic vertebroplasties. IMPRESSION: Cardiomegaly with diffuse bilateral from interstitial prominence again noted. These findings are progressed slightly from prior exam. CHF could present this  fashion. Other etiologies of interstitial lung disease including pneumonitis can not be excluded. Electronically Signed   By: Marcello Moores  Register   On: 07/31/2018 06:43    Cardiac Studies   Echo 04/26/18  - Left ventricle: The cavity size was normal. There was severe   concentric hypertrophy. Systolic function was vigorous. The   estimated ejection fraction was in the range of 65% to 70%. Wall   motion was normal; there were no regional wall motion   abnormalities. The study is not technically sufficient to allow   evaluation of LV diastolic function. - Aortic valve: Sclerosis without stenosis. There was trivial   regurgitation. - Mitral valve: Calcified annulus. Mildly thickened leaflets .   There was trivial regurgitation. - Left atrium: Moderately dilated. - Right ventricle: The cavity size was mildly dilated. Mildly   reduced systolic function. - Tricuspid valve: There was moderate regurgitation. - Pulmonary arteries: PA peak pressure: 59 mm Hg (S). - Inferior vena cava: The vessel was normal in size. The   respirophasic diameter changes were in the normal range (>= 50%),   consistent with normal central venous pressure.  Impressions:  - LVEF 65-70%, severe concentric LV wall thickening - speckled   intraventricular septum suggestive of infiltrative cardiomyopathy   such as amyloidosis. Consider PYP nuclear scan. Grossly normal   wall motion, aortic sclerosis with trivial AI, MAC with trivial   MR, moderate LAE, mildly dilated RV with reduced systolic   function, moderate TR, RVSP 59 mmHg, normal IVC.  Cath 07/31/18  Ost RCA lesion is 90% stenosed.  Mid LM lesion is 50% stenosed.  Ost 1st Mrg lesion is 90% stenosed.  Ost 2nd Mrg lesion is 90% stenosed.  Mid LAD lesion is 80% stenosed.  LV end diastolic pressure is normal. LVEDP 9 mm Hg.  There is no aortic valve stenosis.  Hemodynamic findings consistent with mild pulmonary hypertension.  Ao sat 100%, PA sat  75%, mean PA 26 mm Hg; mean PCWP 10 mm Hg; CO 7.2 L/min; CI 4.4   Severe calcific three vessel CAD.  Will discuss with family.  GIven lack of angina and other comorbidities, would consdier medical therapy.   Patient Profile     84 y.o. 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  Assessment & Plan    Dyspnea: acute on chronic diastolic heart failure vs. Respiratory etiology. -given LVH, amyloid scan done, but negative for amyloid -had normal LV EF on prior echo [facility] showed severe calcific 3V disease, near normal wedge and PA pressures, Wants medical management, no chest pain. -on anticoagulation for afib, would like to add aspirin, but chronically anemic.  -on  metoprolol -will add rosuvastatin (has been on 20 mg before, will start low dose 5 mg to see if tolerates). Had myalgia on atorvastatin.  History of atrial fibrillation: -will stop heparin and restart apixaban tonight (2.5 mg BID given age and weight) -currently in NSR with PACs  Anemia: no gross active bleeding. Management per primary team  Chronic respiratory failure, on home O2, and possible amiodarone lung toxicity: per primary team/pulmonary -no amiodarone ever -steroids per primary/pulm  Chronic kidney disease: Cr improving to 0.81 today  Hypertension: labile but averages within range.  For questions or updates, please contact [place] Please consult [email or web address] for contact info under     Signed, Buford Dresser, MD  08/01/2018, 10:11 AM

## 2018-08-01 NOTE — Progress Notes (Signed)
Inpatient Diabetes Program Recommendations  AACE/ADA: New Consensus Statement on Inpatient Glycemic Control (2015)  Target Ranges:  Prepandial:   less than 140 mg/dL      Peak postprandial:   less than 180 mg/dL (1-2 hours)      Critically ill patients:  140 - 180 mg/dL   Lab Results  Component Value Date   GLUCAP 335 (H) 08/01/2018   Results for PHILA, SHOAF A (MRN [id number]) as of 08/01/2018 12:34  Ref. Range 07/31/2018 07:53 07/31/2018 12:28 07/31/2018 17:05 07/31/2018 21:22 08/01/2018 07:46 08/01/2018 12:06  Glucose-Capillary Latest Ref Range: 70 - 99 mg/dL 250 (H)  No coverage 230 (H)  No coverage 250 (H)  Novolog 5 units  298 (H)  Novolog 3 units 236 (H)  Novolog 5 units 335 (H)  Novolog 11 units     No DM  Current insulin orders: Novolog moderate (0-15 units) tid and (0-5 units) hs. Increased yesterday from Novolog sensitive scale (0-9 units)  Solumedrol 250 mg every 6 hours.   CBG continue to be elevated. Spoke to bedside RN and patient is eating well and drinking Ensure supplements BID between meals.     MD please consider following inpatient insulin recommendations:  Add Novolog 3 units meal coverage tid wc  (Hold if NPO or if patient eats less than 50% of meal.) This is 0.05 units/kg (0.05 units x 59 kg)   As steroid dose is reduced the need for insulin will decrease.    Thank you.  -- Will follow during hospitalization.--  Jonna Clark RN, MSN Diabetes Coordinator Inpatient Glycemic Control Team Team Pager: [phone] (8am-5pm)

## 2018-08-01 NOTE — Care Management Important Message (Signed)
Important Message  Patient Details  Name: Brooke Mullins MRN: [id number] Date of Birth: 11-Sep-1933   Medicare Important Message Given:  Yes    [name] 08/01/2018, 4:13 PM

## 2018-08-02 DIAGNOSIS — I2510 Atherosclerotic heart disease of native coronary artery without angina pectoris: Secondary | ICD-10-CM

## 2018-08-02 DIAGNOSIS — D649 Anemia, unspecified: Secondary | ICD-10-CM

## 2018-08-02 DIAGNOSIS — Z8679 Personal history of other diseases of the circulatory system: Secondary | ICD-10-CM

## 2018-08-02 DIAGNOSIS — J9601 Acute respiratory failure with hypoxia: Secondary | ICD-10-CM

## 2018-08-02 LAB — GLUCOSE, CAPILLARY
Glucose-Capillary: 161 mg/dL — ABNORMAL HIGH (ref 70–99)
Glucose-Capillary: 226 mg/dL — ABNORMAL HIGH (ref 70–99)
Glucose-Capillary: 274 mg/dL — ABNORMAL HIGH (ref 70–99)
Glucose-Capillary: 262 mg/dL — ABNORMAL HIGH (ref 70–99)

## 2018-08-02 LAB — POCT I-STAT 3, VENOUS BLOOD GAS (G3P V)
Acid-base deficit: 1 mmol/L (ref 0.0–2.0)
BICARBONATE: 25 mmol/L (ref 20.0–28.0)
O2 Saturation: 74 %
TCO2: 26 mmol/L (ref 22–32)
pCO2, Ven: 43.4 mmHg — ABNORMAL LOW (ref 44.0–60.0)
pH, Ven: 7.368 (ref 7.250–7.430)
pO2, Ven: 40 mmHg (ref 32.0–45.0)

## 2018-08-02 LAB — POCT I-STAT 3, ART BLOOD GAS (G3+)
Acid-Base Excess: 1 mmol/L (ref 0.0–2.0)
Bicarbonate: 25.5 mmol/L (ref 20.0–28.0)
O2 Saturation: 100 %
TCO2: 27 mmol/L (ref 22–32)
pCO2 arterial: 40.2 mmHg (ref 32.0–48.0)
pH, Arterial: 7.409 (ref 7.350–7.450)
pO2, Arterial: 397 mmHg — ABNORMAL HIGH (ref 83.0–108.0)

## 2018-08-02 MED ORDER — INSULIN GLARGINE 100 UNIT/ML ~~LOC~~ SOLN
15.0000 [IU] | Freq: Every day | SUBCUTANEOUS | Status: DC
  Administered 2018-08-02 – 2018-08-03 (×2): 15 [IU] via SUBCUTANEOUS
  Filled 2018-08-02 (×3): qty 0.15

## 2018-08-02 MED ORDER — ISOSORBIDE MONONITRATE ER 30 MG PO TB24
15.0000 mg | ORAL_TABLET | Freq: Every day | ORAL | Status: DC
  Administered 2018-08-02 – 2018-08-04 (×3): 15 mg via ORAL
  Filled 2018-08-02 (×3): qty 1

## 2018-08-02 MED ORDER — FUROSEMIDE 20 MG PO TABS
20.0000 mg | ORAL_TABLET | Freq: Every day | ORAL | Status: DC
  Administered 2018-08-02: 20 mg via ORAL
  Filled 2018-08-02: qty 1

## 2018-08-02 NOTE — Progress Notes (Signed)
**Note Brooke-Identified via Obfuscation** Progress Note  Patient Name: Brooke Mullins Date of Encounter: 08/02/2018  Primary Cardiologist: [name], MD  Subjective   No chest pain.  Breathing stable.  Was able to sit in bedside recliner yesterday.  No orthopnea.  Inpatient Medications    Scheduled Meds: . apixaban  2.5 mg Oral BID  . escitalopram  20 mg Oral QHS  . feeding supplement (ENSURE ENLIVE)  237 mL Oral BID BM  . insulin aspart  0-15 Units Subcutaneous TID WC  . insulin aspart  0-5 Units Subcutaneous QHS  . insulin glargine  15 Units Subcutaneous QHS  . iron polysaccharides  150 mg Oral Daily  . levothyroxine  75 mcg Oral QAC breakfast  . mouth rinse  15 mL Mouth Rinse BID  . metoprolol tartrate  12.5 mg Oral BID  . multivitamin with minerals  1 tablet Oral Daily  . pantoprazole  40 mg Oral Daily  . polyethylene glycol  17 g Oral Daily  . predniSONE  60 mg Oral Q breakfast  . rosuvastatin  5 mg Oral q1800  . sodium chloride flush  3 mL Intravenous Q12H   Continuous Infusions: . sodium chloride 10 mL/hr at 08/01/18 0000   PRN Meds: sodium chloride, acetaminophen **OR** acetaminophen, acetaminophen, albuterol, HYDROcodone-acetaminophen, morphine injection, ondansetron (ZOFRAN) IV, polyethylene glycol, polyvinyl alcohol, sodium chloride flush   Vital Signs    Vitals:   08/02/18 0500 08/02/18 0600 08/02/18 0652 08/02/18 0700  BP:    (!) 172/72  Pulse:  (!) 113  71  Resp:  16  (!) 24  Temp:    97.9 F (36.6 C)  TempSrc:    Oral  SpO2:  96% 93% 100%  Weight: 58.9 kg     Height:        Intake/Output Summary (Last 24 hours) at 08/02/2018 0829 Last data filed at 08/02/2018 0100 Gross per 24 hour  Intake 716.01 ml  Output 200 ml  Net 516.01 ml   Filed Weights   07/31/18 0355 08/01/18 0500 08/02/18 0500  Weight: 61.8 kg 58.9 kg 58.9 kg    Telemetry    NSR with PAC's - Personally Reviewed  ECG    No new tracing  Physical Exam   GEN: No acute distress.   Neck: No JVD Cardiac: RRR  with occasional extrasystoles, no murmurs, rubs, or gallops.  Respiratory: Normal WOB.  Diffuse rhonchi and scattered wheezes GI: Soft, nontender, non-distended  MS: No edema; No deformity. Neuro:  Nonfocal  Psych: Normal affect   Labs    Chemistry Recent Labs  Lab 07/30/18 0329 07/31/18 0228 08/01/18 0313  NA 133* 133* 135  K 4.1 4.4 4.4  CL 93* 93* 96*  CO2 28 28 26   GLUCOSE 237* 225* 258*  BUN 46* 35* 32*  CREATININE 1.25* 0.94 0.81  CALCIUM 8.6* 8.7* 8.4*  GFRNONAA 40* 56* >60  GFRAA 46* >60 >60  ANIONGAP 12 12 13      Hematology Recent Labs  Lab 07/30/18 0329 07/31/18 0228 08/01/18 0313  WBC 8.2 8.3 6.2  RBC 3.78* 3.67* 3.56*  HGB 8.8* 8.8* 8.3*  HCT 28.4* 27.9* 27.3*  MCV 75.1* 76.0* 76.7*  MCH 23.3* 24.0* 23.3*  MCHC 31.0 31.5 30.4  RDW 21.7* 21.6* 21.6*  PLT 313 339 285    Cardiac EnzymesNo results for input(s): TROPONINI in the last 168 hours. No results for input(s): TROPIPOC in the last 168 hours.   BNP Recent Labs  Lab 07/26/18 1653  BNP 1,180.8*  DDimer No results for input(s): DDIMER in the last 168 hours.   Radiology    No results found.  Cardiac Studies   Echo 04/26/18  - Left ventricle: The cavity size was normal. There was severe concentric hypertrophy. Systolic function was vigorous. The estimated ejection fraction was in the range of 65% to 70%. Wall motion was normal; there were no regional wall motion abnormalities. The study is not technically sufficient to allow evaluation of LV diastolic function. - Aortic valve: Sclerosis without stenosis. There was trivial regurgitation. - Mitral valve: Calcified annulus. Mildly thickened leaflets . There was trivial regurgitation. - Left atrium: Moderately dilated. - Right ventricle: The cavity size was mildly dilated. Mildly reduced systolic function. - Tricuspid valve: There was moderate regurgitation. - Pulmonary arteries: PA peak pressure: 59 mm Hg (S). -  Inferior vena cava: The vessel was normal in size. The respirophasic diameter changes were in the normal range (>= 50%), consistent with normal central venous pressure.  Impressions:  - LVEF 65-70%, severe concentric LV wall thickening - speckled intraventricular septum suggestive of infiltrative cardiomyopathy such as amyloidosis. Consider PYP nuclear scan. Grossly normal wall motion, aortic sclerosis with trivial AI, MAC with trivial MR, moderate LAE, mildly dilated RV with reduced systolic function, moderate TR, RVSP 59 mmHg, normal IVC.  Cath 07/31/18  Ost RCA lesion is 90% stenosed.  Mid LM lesion is 50% stenosed.  Ost 1st Mrg lesion is 90% stenosed.  Ost 2nd Mrg lesion is 90% stenosed.  Mid LAD lesion is 80% stenosed.  LV Anistyn Graddy diastolic pressure is normal. LVEDP 9 mm Hg.  There is no aortic valve stenosis.  Hemodynamic findings consistent with mild pulmonary hypertension.  Ao sat 100%, PA sat 75%, mean PA 26 mm Hg; mean PCWP 10 mm Hg; CO 7.2 L/min; CI 4.4  Severe calcific three vessel CAD.  Patient Profile     85 y.o. 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  Assessment & Plan    Shortness of breath and acute on chronic respiratory failure with hypoxia Likely multifactorial, including underlying interstitial lung disease and an element of diastolic heart failure.  L/RHC from 2 days personally reviewed; hemodynamics argue against diastolic heart failure as the primary cause of her hypoxia and shortness of breath.  Some degree of dyspnea could be anginal equivalent in the setting of significant 3-vessel CAD.  However, this does not explain her high O2 requirement.  Amyloid scan equivocal.  Gentle diuresis to  maintain net even to slightly negative fluid balance; patient was positive ~500 mL yesterday.  Steroid taper per pulmonology.  Avoid amiodarone going forward.  Will add low-dose isosorbide mononitrate to see if this offers some symptomatic improvement.  If hemoglobin drops further (<8 g/dL) or patient develops angina, consider PRBC transfusion.  Coronary artery disease Severe 3-vessel CAD noted on catheterization.  She is not a good candidate for surgical or percutaneous revascularization and would prefer medical therapy.  On apixaban for atrial fibrillation; in the setting of anemia, I will defer adding ASA.  Continue metoprolol and rosuvastatin.  Start isosorbide mononitrate 15 mg daily.  Atrial fibrillation Maintaining NSR with PACs.  Heparin switched to apixaban yesterday.  Continue current doses of metoprolol and apixaban.  Low threshold for stopping apixaban if anemia worsens.  Hypertension Labile and moderately elevated this morning.  Add isosorbide mononitrate  15 mg daily.  Continue metoprolol tartrate 12.5 mg BID.  Anemia Hemoglobin 8-9 (most recent check yesterday).  Consider repeating hemoglobin today or tomorrow; further w/u of anemia per primary service.  Low threshold for stopping apixaban if hemoglobin drops further.  May benefit from PRBC transfusion if hemoglobin drops below 8 g/dL or angina develops.  For questions or updates, please contact [place] Please consult [email or web address] for contact info under   Signed, Nelva Bush, MD  08/02/2018, 8:29 AM

## 2018-08-02 NOTE — Progress Notes (Signed)
PROGRESS NOTE    Brooke Mullins   NLZ:[id number]  DOB: [birth year]  DOA: 07/23/2018 PCP: Park Liter, MD   Brief Narrative:  [name] 85 year old female with history of recently diagnosed pulmonary fibrosis chronic hypoxic respiratory failure on 2 L nasal cannula just prior to admission, persistent A- fib, secondary pulm HTN, PVD came to the ER after fall at home.    Admitted from 04/25/18- 04/28/18 to the cardiology service (as a transfer from [place]) for dyspnea with a productive cough, hypoxia needing a BiPAP. She was diuresed and was weaned down to about 3-4 l O2 when discharged.  She followed up later with a pulmonologist in [place], Dr Bernette Mayers, underwent a lung biopsy and was diagnosed with IPF and started on 20 mg of Prednisone daily. She was also prescribed another medications which they are trying to get approved through her insurance.  She had noted more shortness of breath the day before the fall that brought her to the ED.   In the ER, she was found to have right pelvic fracture, was admitted for pain control & nonoperative management. H and P notes that her pulse ox was 81% the day before.  I started to see her on 12/25 and noted she was on 100% FiO2 via a Face mask. It was noted the night before the she had become more hypoxic. A dose of IV Solumedrol and one of IV Lasix were given. CXR showed interstitial pulmonary edema.  Subjective: No complaints    Assessment & Plan:   Principal Problem:   Acute on chronic respiratory failure with hypoxia  H/o pulmonary fibrosis on Prednisone - last admitted in Sept with acute resp failure from CHF, ECHO showed severe concentric LV wall thickening with speckled intraventricular septum suggestive of infiltrative cardiomyopathy such as amyloidosis.  - CXR suggestive of pulm edema- given multiple doses of Lasix   - 12/25- not improving- checked chest CT- showed b/l ground glass infiltrates- checked pro calcitonin which was  negative - transfered to SDU for BiPAP on 12/25- given further Lasix    - 12/26 - not improving- I felt that this is not a CHF exacerbation- cardiology and pulmonary consults requested for second opinions- suspecting Amio induced pulmonary toxicity -  Amio stopped and IV steroids started by Pulm-  - Per pulm  > 'Start prednisone at 1mg /kg (max dose 80mg  per day) on 12/31 and then slow taper over 8-16 weeks to off- Continue supplemental oxygen to maintain saturations 88 to 92%-No role for Ofev/esbriet (patient has this ordered through Dr Alcide Clever) for now'  -pulmonary plans to make a f/u in ILD clinic with Dr Chase Caller to be considered for ILD PRO study   Active Problems:  Hyperglycemia due to steroids- no h/o DM - have had to start Insulin as sugars close to 300s - on Lantus/ Novolog - will need to increase Lantus today     Persistent atrial fibrillation - cont Metoprolol- hold Amio - was Heparin infusion for cath- now back on Apixaban     Mild AKI   - baseline Cr ~ 0.8-0.9-Cr rose slightly to ~ 1.33 with elevated BUN likely a bit dry due to lasix- - Cr subsequently improved after holding Lasix    Infiltrative cardiomyopathy?  CAD noted on CT scan - see above in regards to diuretics - cardiology consulted to further evaluated this - TTR Amyloid scan neg for amyloid per Cardiology - right and left heart cath shows mild pulm HTN, multivessel, severe calcific  three vessel disease- cardiology recommending medical management - use Apixaban and no aspirin - Crestor and Imdur started     Pelvic fracture/   Fall at home, initial encounter - pain control- will be going to SNF in [place] consult 12/23>  can f/u with orthopedic surgeon in [place] who performed hemiarthroplasty-  but may see Dr. Griffin Basil who evaluated her in the hospital    Hypothyroidism - Synthroid  Constipated - cont laxatives  Anemia  - likely due to chronic disease- keep > 8 due to severe CAD  DVT prophylaxis:  Eliquis Code Status: DNR Family Communication: daughter and son Disposition Plan:    f/u with SW for SNF Consultants:   PCCM  Cardiology  ortho Procedures:  2 D ECHO  Ost RCA lesion is 90% stenosed.  Mid LM lesion is 50% stenosed.  Ost 1st Mrg lesion is 90% stenosed.  Ost 2nd Mrg lesion is 90% stenosed.  Mid LAD lesion is 80% stenosed.  LV end diastolic pressure is normal. LVEDP 9 mm Hg.  There is no aortic valve stenosis.  Hemodynamic findings consistent with mild pulmonary hypertension.  Ao sat 100%, PA sat 75%, mean PA 26 mm Hg; mean PCWP 10 mm Hg; CO 7.2 L/min; CI 4.4   Cardiac cath: Severe calcific three vessel CAD. Antimicrobials:  Anti-infectives (From admission, onward)   None       Objective: Vitals:   08/02/18 0250 08/02/18 0500 08/02/18 0600 08/02/18 0652  BP: (!) 160/75     Pulse: 68  (!) 113   Resp: 20  16   Temp: 98.4 F (36.9 C)     TempSrc: Oral     SpO2: 94%  96% 93%  Weight:  58.9 kg    Height:        Intake/Output Summary (Last 24 hours) at 08/02/2018 0740 Last data filed at 08/02/2018 0100 Gross per 24 hour  Intake 716.01 ml  Output 200 ml  Net 516.01 ml   Filed Weights   07/31/18 0355 08/01/18 0500 08/02/18 0500  Weight: 61.8 kg 58.9 kg 58.9 kg    Examination: General exam: Appears comfortable  HEENT: PERRLA, oral mucosa moist, no sclera icterus or thrush Respiratory system: b/l crackles- Respiratory effort normal-  - remains on 50 % Fio2 high flow [place]. Respiratory effort normal Cardiovascular system: S1 & S2 heard,  No murmurs  Gastrointestinal system: Abdomen soft, non-tender, nondistended. Normal bowel sound. No organomegaly Central nervous system: Alert and oriented. No focal neurological deficits. Extremities: No cyanosis, clubbing or edema Skin: No rashes or ulcers Psychiatry:  Mood & affect appropriate. .    Data Reviewed: I have personally reviewed following labs and imaging studies  CBC: Recent Labs  Lab  07/28/18 0246 07/29/18 0204 07/30/18 0329 07/31/18 0228 08/01/18 0313  WBC 8.0 10.8* 8.2 8.3 6.2  HGB 9.4* 9.1* 8.8* 8.8* 8.3*  HCT 30.5* 29.2* 28.4* 27.9* 27.3*  MCV 75.7* 75.1* 75.1* 76.0* 76.7*  PLT 289 324 313 339 660   Basic Metabolic Panel: Recent Labs  Lab 07/28/18 0246 07/29/18 0204 07/30/18 0329 07/31/18 0228 08/01/18 0313  NA 131* 131* 133* 133* 135  K 4.6 3.9 4.1 4.4 4.4  CL 89* 90* 93* 93* 96*  CO2 27 26 28 28 26   GLUCOSE 252* 262* 237* 225* 258*  BUN 28* 48* 46* 35* 32*  CREATININE 1.06* 1.33* 1.25* 0.94 0.81  CALCIUM 8.8* 8.9 8.6* 8.7* 8.4*   GFR: Estimated Creatinine Clearance: 46.2 mL/min (by C-G formula based  on SCr of 0.81 mg/dL). Liver Function Tests: No results for input(s): AST, ALT, ALKPHOS, BILITOT, PROT, ALBUMIN in the last 168 hours. Recent Labs  Lab 07/27/18 1326  LIPASE 52*   No results for input(s): AMMONIA in the last 168 hours. Coagulation Profile: No results for input(s): INR, PROTIME in the last 168 hours. Cardiac Enzymes: Recent Labs  Lab 07/27/18 1326  CKTOTAL 14*  CKMB 1.1   BNP (last 3 results) Recent Labs    05/12/18 1020  PROBNP 2,040*   HbA1C: No results for input(s): HGBA1C in the last 72 hours. CBG: Recent Labs  Lab 07/31/18 2122 08/01/18 0746 08/01/18 1206 08/01/18 1658 08/01/18 2116  GLUCAP 298* 236* 335* 279* 219*   Lipid Profile: No results for input(s): CHOL, HDL, LDLCALC, TRIG, CHOLHDL, LDLDIRECT in the last 72 hours. Thyroid Function Tests: No results for input(s): TSH, T4TOTAL, FREET4, T3FREE, THYROIDAB in the last 72 hours. Anemia Panel: No results for input(s): VITAMINB12, FOLATE, FERRITIN, TIBC, IRON, RETICCTPCT in the last 72 hours. Urine analysis:    Component Value Date/Time   COLORURINE YELLOW (A) 07/23/2018 1516   APPEARANCEUR CLEAR (A) 07/23/2018 1516   LABSPEC 1.020 07/23/2018 1516   PHURINE 7.0 07/23/2018 1516   GLUCOSEU NEGATIVE 07/23/2018 1516   HGBUR NEGATIVE 07/23/2018 1516     BILIRUBINUR NEGATIVE 07/23/2018 1516   KETONESUR NEGATIVE 07/23/2018 1516   PROTEINUR NEGATIVE 07/23/2018 1516   NITRITE NEGATIVE 07/23/2018 1516   LEUKOCYTESUR POSITIVE (A) 07/23/2018 1516   Sepsis Labs: @LABRCNTIP (procalcitonin:4,lacticidven:4) ) Recent Results (from the past 240 hour(s))  Respiratory Panel by PCR     Status: None   Collection Time: 07/27/18 12:58 PM  Result Value Ref Range Status   Adenovirus NOT DETECTED NOT DETECTED Final   Coronavirus 229E NOT DETECTED NOT DETECTED Final   Coronavirus HKU1 NOT DETECTED NOT DETECTED Final   Coronavirus NL63 NOT DETECTED NOT DETECTED Final   Coronavirus OC43 NOT DETECTED NOT DETECTED Final   Metapneumovirus NOT DETECTED NOT DETECTED Final   Rhinovirus / Enterovirus NOT DETECTED NOT DETECTED Final   Influenza A NOT DETECTED NOT DETECTED Final   Influenza B NOT DETECTED NOT DETECTED Final   Parainfluenza Virus 1 NOT DETECTED NOT DETECTED Final   Parainfluenza Virus 2 NOT DETECTED NOT DETECTED Final   Parainfluenza Virus 3 NOT DETECTED NOT DETECTED Final   Parainfluenza Virus 4 NOT DETECTED NOT DETECTED Final   Respiratory Syncytial Virus NOT DETECTED NOT DETECTED Final   Bordetella pertussis NOT DETECTED NOT DETECTED Final   Chlamydophila pneumoniae NOT DETECTED NOT DETECTED Final   Mycoplasma pneumoniae NOT DETECTED NOT DETECTED Final    Comment: Performed at [facility], [place] [address] [place], [place] [address]         Radiology Studies: No results found.    Scheduled Meds: . apixaban  2.5 mg Oral BID  . escitalopram  20 mg Oral QHS  . feeding supplement (ENSURE ENLIVE)  237 mL Oral BID BM  . insulin aspart  0-15 Units Subcutaneous TID WC  . insulin aspart  0-5 Units Subcutaneous QHS  . insulin glargine  10 Units Subcutaneous QHS  . iron polysaccharides  150 mg Oral Daily  . levothyroxine  75 mcg Oral QAC breakfast  . mouth rinse  15 mL Mouth Rinse BID  . metoprolol tartrate  12.5 mg Oral BID  .  multivitamin with minerals  1 tablet Oral Daily  . pantoprazole  40 mg Oral Daily  . polyethylene glycol  17  g Oral Daily  . predniSONE  60 mg Oral Q breakfast  . rosuvastatin  5 mg Oral q1800  . sodium chloride flush  3 mL Intravenous Q12H   Continuous Infusions: . sodium chloride 10 mL/hr at 08/01/18 0000     LOS: 10 days    Time spent in minutes: 35    Debbe Odea, MD [facility] Pager: [email or web address] Password [email or web address] 08/02/2018, 7:40 AM

## 2018-08-02 NOTE — Progress Notes (Signed)
Physical Therapy Treatment Patient Details Name: Brooke Mullins MRN: [id number] DOB: 1934/07/12 Today's Date: 08/02/2018    History of Present Illness Pt is 85 yo female who fell at home and sustained R acetabular and pubic ramus fx in setting of past hemiarthroplasty. PMH: GERD, HTN, a fib, DM, PVD, and osteoporosis.    PT Comments    Pt pleasant and very willing to mobilize to be able to toilet. Pt with increased bed mobility but decreased ability with standing and transfer this session. Pt able to state TDWB status but great difficulty in maintaining as she continues to try to push through RLE. Education for sequence and assist with OOB with 2 person assist recommended daily with lateral scoot transfer. Will continue to follow.   SPO2 100% at rest on HFNC, dropped to 88% with pivot to [facility] with increased time and cues to recover to 94%   Follow Up Recommendations  SNF;Supervision/Assistance - 24 hour     Equipment Recommendations  None recommended by PT    Recommendations for Other Services       Precautions / Restrictions Precautions Precautions: Fall Precaution Comments: pt on HFNC Restrictions Weight Bearing Restrictions: Yes RLE Weight Bearing: Touchdown weight bearing    Mobility  Bed Mobility Overal bed mobility: Needs Assistance Bed Mobility: Supine to Sit     Supine to sit: HOB elevated Sit to supine: Min guard   General bed mobility comments: HOB 30 degrees with pt using rail and able to piviot to edge without assist, guarding for lines  Transfers Overall transfer level: Needs assistance   Transfers: Sit to/from Stand Sit to Stand: Max assist Stand pivot transfers: Max assist       General transfer comment: elevated bed with max assist to rise from surface x 3 trials before able to pivot to left to [facility]. RLE on P.T. foot throughout to prevent weight bearing with pt continually attempting to push through RLE, cues to maintain TDWB.  +2 max assist to stand  from [facility] x 3 total for pericare and transfer BSC to bed  Ambulation/Gait             General Gait Details: unable   Stairs             Wheelchair Mobility    Modified Rankin (Stroke Patients Only)       Balance Overall balance assessment: Needs assistance   Sitting balance-Leahy Scale: Good     Standing balance support: Bilateral upper extremity supported;During functional activity Standing balance-Leahy Scale: Poor Standing balance comment: pt with anterior lean, trunk flexed and decreased ability to achieve trunk extension                            Cognition Arousal/Alertness: Awake/alert Behavior During Therapy: WFL for tasks assessed/performed Overall Cognitive Status: Within Functional Limits for tasks assessed                                        Exercises      General Comments        Pertinent Vitals/Pain Pain Assessment: No/denies pain    Home Living                      Prior Function            PT Goals (current goals can  now be found in the care plan section) Progress towards PT goals: Progressing toward goals(decreased ability with standing this session)    Frequency    Min 2X/week      PT Plan Current plan remains appropriate    Co-evaluation              AM-PAC PT "6 Clicks" Mobility   Outcome Measure  Help needed turning from your back to your side while in a flat bed without using bedrails?: None Help needed moving from lying on your back to sitting on the side of a flat bed without using bedrails?: A Little Help needed moving to and from a bed to a chair (including a wheelchair)?: A Lot Help needed standing up from a chair using your arms (e.g., wheelchair or bedside chair)?: A Lot Help needed to walk in hospital room?: Total Help needed climbing 3-5 steps with a railing? : Total 6 Click Score: 13    End of Session Equipment Utilized During Treatment: Gait  belt Activity Tolerance: Patient tolerated treatment well Patient left: with call bell/phone within reach;in bed;with nursing/sitter in room;with family/visitor present Nurse Communication: Mobility status;Weight bearing status PT Visit Diagnosis: Unsteadiness on feet (R26.81);Pain;Difficulty in walking, not elsewhere classified (R26.2);History of falling (Z91.81);Muscle weakness (generalized) (M62.81)     Time: 9604-5409 PT Time Calculation (min) (ACUTE ONLY): 38 min  Charges:  $Therapeutic Activity: 23-37 mins                     [place], PT Acute Rehabilitation Services Pager: [phone] Office: [place] 08/02/2018, 9:04 AM

## 2018-08-02 NOTE — Plan of Care (Signed)
  Problem: Clinical Measurements: Goal: Ability to maintain clinical measurements within normal limits will improve Outcome: Progressing Goal: Will remain free from infection Outcome: Progressing Goal: Cardiovascular complication will be avoided Outcome: Progressing   Problem: Nutrition: Goal: Adequate nutrition will be maintained Outcome: Progressing   Problem: Coping: Goal: Level of anxiety will decrease Outcome: Progressing   Problem: Elimination: Goal: Will not experience complications related to bowel motility Outcome: Progressing Goal: Will not experience complications related to urinary retention Outcome: Progressing   Problem: Pain Managment: Goal: General experience of comfort will improve Outcome: Progressing   Problem: Safety: Goal: Ability to remain free from injury will improve Outcome: Progressing   Problem: Skin Integrity: Goal: Risk for impaired skin integrity will decrease Outcome: Progressing   Problem: Cardiovascular: Goal: Ability to achieve and maintain adequate cardiovascular perfusion will improve Outcome: Progressing Goal: Vascular access site(s) Level 0-1 will be maintained Outcome: Progressing

## 2018-08-02 NOTE — Progress Notes (Signed)
NAME:  Brooke Mullins, MRN:  [id number], DOB:  Feb 07, 1934, LOS: 64 ADMISSION DATE:  07/23/2018, CONSULTATION DATE:  07/27/18 REFERRING MD:  Wynelle Cleveland  CHIEF COMPLAINT:  SOB   Brief History   Brooke Mullins is a 85 y.o. female who was admitted 12/22 after fall.  Found to have right hip fx's that are being managed conservatively. PCCM called 12/26 due to high FiO2 needs due to possible underlying IPF (see discussion in HPI regarding IPF diagnosis).  ILD HX   [place] Integrated Comprehensive ILD Questionnaire - family and patient filled this out and this is available currently   Symptoms:    Started amiodarone tab in may 2019. Resp issues developed after that. Major hypoxemia episode was sept 2019. Dyspnea progressive.  Level 2 dyspnea at rest.  Level for dyspnea for activities of daily living and level 5 dyspnea for walking.  There is associated cough.  The cough also started in 2019 along with the shortness of breath.  It is moderate in intensity.  She coughs at night.  There is yellow phlegm.  The coughing is affecting the voice.   in November 2019, she saw Dr. Bernette Mayers in [place] who "used a camera to go down and take pieces from my lung".  She was told that she had IPF and was started on 20mg  prednisone daily.  Ofev has been approived but not started    Past Medical History : Heart failure with atrial fibrillation.  On amiodarone since May 2019.  No collagen vascular disease.  No seizures.  Reported diagnosis of thyroid disease since July or August 2019 again postdating amiodarone.  No stroke or seizures.  No hepatitis.  No pneumonia history.  No blood clots.  No pleurisy.   ROS: Does have fatigue but no arthralgia.  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  After the fall in June she is lost 34 pounds.  No nausea vomiting rash   FAMILY HISTORY of LUNG DISEASE: Her father and sister had COPD but she denies any pulmonary fibrosis   EXPOSURE  HISTORY: She smoked cigarettes 1 pack a day.  Do not know when she started but she quit in 1994.  There is no passive smoking exposure.  No marijuana or vapor cocaine or intravenous drug use exposure   HOME and HOBBY DETAILS [place]  Age of the home is 100 years.  There is no dampness.  There is normal level of mildew in the shower curtain on occasion.  She does use the bathroom where there is mildew.  Does not use a humidifier.  No CPAP mask at home.  Does have a nebulizer machine but no contamination.  She does have a steam iron but no contamination.  No pet birds or parakeets of feather pillow.  No mold in the [facility] duct.  No gardening or music habits.   OCCUPATIONAL HISTORY (122 questions) : Positive for previously working in a damp environment and she is worked in the [employer] before she retired.  During this time the environment was dusty   PULMONARY TOXICITY HISTORY (27 items): She has been on amiodarone since May 2019.  And currently is in November 2019 on prednisone 20 mg/day  PULMONARY TOXICITY HISTORY (27 items): She has been on amiodarone since May 2019.  And currently is in November 2019 on prednisone 20 mg/day Results for Mullins, Brooke A (  MRN [id number]) as of 07/30/2018 12:45  Ref. Range 07/27/2018 13:26  ANA Ab, IFA Unknown Negative  ANCA Proteinase 3 Latest Ref Range: 0.0 - 3.5 U/mL <3.5  CCP Antibodies IgG/IgA Latest Ref Range: 0 - 19 units 8  ds DNA Ab Latest Ref Range: 0 - 9 IU/mL <1  GBM Ab Latest Ref Range: 0 - 20 units 3  Myeloperoxidase Abs Latest Ref Range: 0.0 - 9.0 U/mL <9.0  RA Latex Turbid. Latest Ref Range: 0.0 - 13.9 IU/mL 46.2 (H)  SSA (Ro) (ENA) Antibody, IgG Latest Ref Range: 0.0 - 0.9 AI <0.2  SSB (La) (ENA) Antibody, IgG Latest Ref Range: 0.0 - 0.9 AI <0.2  Scleroderma (Scl-70) (ENA) Antibody, IgG Latest Ref Range: 0.0 - 0.9 AI <0.2  Results for Brooke, Mullins (MRN [id number]) as of 07/30/2018  12:45  Ref. Range 07/27/2018 13:26  Sed Rate Latest Ref Range: 0 - 22 mm/hr 72 (H)   HP panel negative  Significant Hospital Events   12/22 > admit. 12/26 > PCCM consult. Steroids increased to 60mg  Q6h.  RVP - negative, Urine strep negzativeSed rate 70s and RF mildly elevated . Rest negative. HP panel pending 12/27: negative 2.4 liters. . ILD questionnaire - Amio intake +, Engineer, manufacturing systems +. Mild mildew/mold exppsure in bathroom in 83 year old home, prior smoker 12/28 - Remains on HFNC 75%,. Easy desats into the 70s with movement. Not using night bipap. Steroids increased to 1gm/day 12/30 - LHC/RHC  1/1 Off HFNC, on [place]  Consults:  Orthopedics. PCCM 12/26>>  Procedures:  None.  Significant Diagnostic Tests:  CT chest 12/25 > Diffuse GGO's with bronchiectasis.  New compression fx of T4 vertebral body. LHC 12/31 > 3v CAD RHC 12/31 > PA 44mmHg, PCWP 53mmHg, CO 7.2L/min  Micro Data:  Sputum 12/26 >   Antimicrobials:  None.   Interim history/subjective:  Patient reports she continues to have shortness of breath however is using incentive spirometry and eager to mobilize out of bed.  She was transitioned to high flow nasal cannula this morning and tolerating it well.  Denies chest pain.  Reports good appetite.  Worked with PT this morning and on exertion dropped to 88% with long recovery per PT comment notes  Objective:  Blood pressure 134/66, pulse 78, temperature 97.7 F (36.5 C), temperature source Oral, resp. rate (!) 23, height 5\' 4"  (1.626 m), weight 58.9 kg, SpO2 100 %.    FiO2 (%):  [40 %-50 %] 50 %   Intake/Output Summary (Last 24 hours) at 08/02/2018 1509 Last data filed at 08/02/2018 0800 Gross per 24 hour  Intake -  Output 202 ml  Net -202 ml   Filed Weights   07/31/18 0355 08/01/18 0500 08/02/18 0500  Weight: 61.8 kg 58.9 kg 58.9 kg   Physical Exam: General: Frail appearing female laying in bed, no acute distress HENT: [place], AT, on nasal cannula Eyes: EOMI, no  scleral icterus Respiratory: Fine crackles bilaterally.  No wheezing or rhonchi  Cardiovascular: RRR, no MGR, no JVD GI: BS+, soft, nontender Extremities:-Edema,-tenderness Neuro: AAO x4, CNII-XII grossly intact Skin: Intact, no rashes or bruising Psych: Normal mood, normal affect GU: Foley in place  Assessment & Plan:  85 year old female who initially presented with fall and found with right hip fracture.  PCCM consulted for hypoxemia.  Acute on chronic hypoxemic respiratory failure  Concern for interstitial lung disease Suspected amiodarone lung toxicity CT chest 07/26/2018 demonstrates bilateral diffuse interstitial and groundglass infiltrates.  Presentation concerning for acute amiodarone  lung toxicity.  Other etiologies considered include undiagnosed UIP or subacute hypersensitive pneumonitis.  Resting oxygen requirements improving however continues to require high FiO2 with exertion.  Yesterday LHC demonstrates three-vessel CAD with recommendations from cardiology to medically manage.  RHC suggestive of mild pulmonary hypertension.  Respiratory status is improving and now currently on 10 L nasal cannula at rest.  Will need to determine oxygen requirement with exertion.  PLAN -S/p solumedrol at 1g/day x 48h.  -Continue prednisone 1 mg/kg (60mg ) daily. Plan to slow taper over the next 2 to 4 months  -Continue to wean supplemental oxygen for goal saturations 88 to 92%      Have discussed plan of care with nursing today to wean O2 as tolerated during rest.  Staff aware to titrate up O2 requirements prior to activity.   -Patient will need ambulatory O2 prior to discharge -Recommend diuresis as tolerated with goal net even daily -Amiodarone has been discontinued.  Recommend against restarting. -No role for Ofev/esbriet (patient has this ordered through Dr Alcide Clever) for now -We will arrange with follow-up in ILD clinic with Dr. Chase Caller to be considered for ILD PRO study as an  outpatient  Best Practice:  Diet: Regular Pain/Anxiety/Delirium protocol (if indicated): N/A. VAP protocol (if indicated): N/A. DVT prophylaxis: SCD's / apixaban. GI prophylaxis: Pantoprazole. Glucose control: N/A. Mobility: Bedrest. Code Status: DNR. Family Communication: Husband and daughter at bedside.  We discussed at length her current medical management, plan to taper steroids and counsel on the importance of compliance with therapy and physical therapy/mobilization/rehabilitation.  Care Time devoted to patient care services described in this note is 54 Minutes with greater than 50% of time spent face-to-face with patient. This time reflects time of care of this signee Dr. Rodman Pickle. This critical care time does not reflect procedure time, or teaching time or supervisory time of PA/NP/Med student/Med Resident etc but could involve care discussion time.  Rodman Pickle, M.D. [facility] Pulmonary/Critical Care Medicine. Pager: [phone]. After hours pager: [phone].

## 2018-08-03 DIAGNOSIS — I2584 Coronary atherosclerosis due to calcified coronary lesion: Secondary | ICD-10-CM

## 2018-08-03 LAB — BASIC METABOLIC PANEL
ANION GAP: 8 (ref 5–15)
BUN: 28 mg/dL — ABNORMAL HIGH (ref 8–23)
CO2: 30 mmol/L (ref 22–32)
Calcium: 8.2 mg/dL — ABNORMAL LOW (ref 8.9–10.3)
Chloride: 94 mmol/L — ABNORMAL LOW (ref 98–111)
Creatinine, Ser: 0.74 mg/dL (ref 0.44–1.00)
GFR calc Af Amer: >60 mL/min (ref >60)
GFR calc non Af Amer: >60 mL/min (ref >60)
Glucose, Bld: 180 mg/dL — ABNORMAL HIGH (ref 70–99)
Potassium: 4.4 mmol/L (ref 3.5–5.1)
Sodium: 132 mmol/L — ABNORMAL LOW (ref 135–145)

## 2018-08-03 LAB — GLUCOSE, CAPILLARY
GLUCOSE-CAPILLARY: 200 mg/dL — AB (ref 70–99)
Glucose-Capillary: 117 mg/dL — ABNORMAL HIGH (ref 70–99)
Glucose-Capillary: 238 mg/dL — ABNORMAL HIGH (ref 70–99)
Glucose-Capillary: 350 mg/dL — ABNORMAL HIGH (ref 70–99)

## 2018-08-03 LAB — CBC
HEMATOCRIT: 26.3 % — AB (ref 36.0–46.0)
Hemoglobin: 8.4 g/dL — ABNORMAL LOW (ref 12.0–15.0)
MCH: 24.3 pg — ABNORMAL LOW (ref 26.0–34.0)
MCHC: 31.9 g/dL (ref 30.0–36.0)
MCV: 76 fL — ABNORMAL LOW (ref 80.0–100.0)
NRBC: 0 % (ref 0.0–0.2)
Platelets: 310 10*3/uL (ref 150–400)
RBC: 3.46 MIL/uL — ABNORMAL LOW (ref 3.87–5.11)
RDW: 22 % — ABNORMAL HIGH (ref 11.5–15.5)
WBC: 9.4 10*3/uL (ref 4.0–10.5)

## 2018-08-03 MED ORDER — FUROSEMIDE 10 MG/ML IJ SOLN
20.0000 mg | Freq: Once | INTRAMUSCULAR | Status: AC
  Administered 2018-08-03: 20 mg via INTRAVENOUS
  Filled 2018-08-03: qty 2

## 2018-08-03 MED ORDER — PRO-STAT SUGAR FREE PO LIQD
30.0000 mL | Freq: Every day | ORAL | Status: DC
  Administered 2018-08-03 – 2018-08-04 (×2): 30 mL via ORAL
  Filled 2018-08-03 (×2): qty 30

## 2018-08-03 NOTE — Progress Notes (Signed)
NAME:  Brooke Mullins, MRN:  [id number], DOB:  12/16/1933, LOS: 50 ADMISSION DATE:  07/23/2018, CONSULTATION DATE:  07/27/18 REFERRING MD:  Wynelle Cleveland  CHIEF COMPLAINT:  SOB   Brief History   Brooke Mullins is a 85 y.o. female who was admitted 12/22 after fall.  Found to have right hip fx's that are being managed conservatively. PCCM called 12/26 due to high FiO2 needs due to possible underlying IPF (see discussion in HPI regarding IPF diagnosis).  ILD HX   [place] Integrated Comprehensive ILD Questionnaire - family and patient filled this out and this is available currently   Symptoms:    Started amiodarone tab in may 2019. Resp issues developed after that. Major hypoxemia episode was sept 2019. Dyspnea progressive.  Level 2 dyspnea at rest.  Level for dyspnea for activities of daily living and level 5 dyspnea for walking.  There is associated cough.  The cough also started in 2019 along with the shortness of breath.  It is moderate in intensity.  She coughs at night.  There is yellow phlegm.  The coughing is affecting the voice.   in November 2019, she saw Dr. Bernette Mayers in [place] who "used a camera to go down and take pieces from my lung".  She was told that she had IPF and was started on 20mg  prednisone daily.  Ofev has been approived but not started    Past Medical History : Heart failure with atrial fibrillation.  On amiodarone since May 2019.  No collagen vascular disease.  No seizures.  Reported diagnosis of thyroid disease since July or August 2019 again postdating amiodarone.  No stroke or seizures.  No hepatitis.  No pneumonia history.  No blood clots.  No pleurisy.   ROS: Does have fatigue but no arthralgia.  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  After the fall in June she is lost 34 pounds.  No nausea vomiting rash   FAMILY HISTORY of LUNG DISEASE: Her father and sister had COPD but she denies any pulmonary fibrosis   EXPOSURE  HISTORY: She smoked cigarettes 1 pack a day.  Do not know when she started but she quit in 1994.  There is no passive smoking exposure.  No marijuana or vapor cocaine or intravenous drug use exposure   HOME and HOBBY DETAILS : [place]  Age of the home is 100 years.  There is no dampness.  There is normal level of mildew in the shower curtain on occasion.  She does use the bathroom where there is mildew.  Does not use a humidifier.  No CPAP mask at home.  Does have a nebulizer machine but no contamination.  She does have a steam iron but no contamination.  No pet birds or parakeets of feather pillow.  No mold in the [facility] duct.  No gardening or music habits.   OCCUPATIONAL HISTORY (122 questions) : Positive for previously working in a damp environment and she is worked in the [employer] before she retired.  During this time the environment was dusty   PULMONARY TOXICITY HISTORY (27 items): She has been on amiodarone since May 2019.  And currently is in November 2019 on prednisone 20 mg/day  PULMONARY TOXICITY HISTORY (27 items): She has been on amiodarone since May 2019.  And currently is in November 2019 on prednisone 20 mg/day Results for Braggs, Manmeet A (  MRN [id number]) as of 07/30/2018 12:45  Ref. Range 07/27/2018 13:26  ANA Ab, IFA Unknown Negative  ANCA Proteinase 3 Latest Ref Range: 0.0 - 3.5 U/mL <3.5  CCP Antibodies IgG/IgA Latest Ref Range: 0 - 19 units 8  ds DNA Ab Latest Ref Range: 0 - 9 IU/mL <1  GBM Ab Latest Ref Range: 0 - 20 units 3  Myeloperoxidase Abs Latest Ref Range: 0.0 - 9.0 U/mL <9.0  RA Latex Turbid. Latest Ref Range: 0.0 - 13.9 IU/mL 46.2 (H)  SSA (Ro) (ENA) Antibody, IgG Latest Ref Range: 0.0 - 0.9 AI <0.2  SSB (La) (ENA) Antibody, IgG Latest Ref Range: 0.0 - 0.9 AI <0.2  Scleroderma (Scl-70) (ENA) Antibody, IgG Latest Ref Range: 0.0 - 0.9 AI <0.2  Results for CALLEE, ROHRIG (MRN [id number]) as of 07/30/2018  12:45  Ref. Range 07/27/2018 13:26  Sed Rate Latest Ref Range: 0 - 22 mm/hr 72 (H)   HP panel negative  Significant Hospital Events   12/22 > admit. 12/26 > PCCM consult. Steroids increased to 60mg  Q6h.  RVP - negative, Urine strep negzativeSed rate 70s and RF mildly elevated . Rest negative. HP panel pending 12/27: negative 2.4 liters. . ILD questionnaire - Amio intake +, Engineer, manufacturing systems +. Mild mildew/mold exppsure in bathroom in 83 year old home, prior smoker 12/28 - Remains on HFNC 75%,. Easy desats into the 70s with movement. Not using night bipap. Steroids increased to 1gm/day 12/30 - LHC/RHC  1/1 Off HFNC, on [place]  Consults:  Orthopedics. PCCM 12/26>>  Procedures:  None.  Significant Diagnostic Tests:  CT chest 12/25 > Diffuse GGO's with bronchiectasis.  New compression fx of T4 vertebral body. LHC 12/31 > 3v CAD RHC 12/31 > PA 2mmHg, PCWP 46mmHg, CO 7.2L/min  Micro Data:  Sputum 12/26 >   Antimicrobials:  None.   Interim history/subjective:  Reports unchanged shortness of breath.  Notices that her oxygen will drop with exertion however her dyspnea is unchanged.  Denies chest pain.  Feels like she has to be reminded to take deep breaths.  Continues to use incentive spirometry when she can remember.  Objective:  Blood pressure (!) 120/53, pulse 73, temperature 97.9 F (36.6 C), temperature source Oral, resp. rate 19, height 5\' 4"  (1.626 m), weight 61.5 kg, SpO2 99 %.        Intake/Output Summary (Last 24 hours) at 08/03/2018 2107 Last data filed at 08/03/2018 1500 Gross per 24 hour  Intake 463 ml  Output -  Net 463 ml   Filed Weights   08/01/18 0500 08/02/18 0500 08/03/18 0341  Weight: 58.9 kg 58.9 kg 61.5 kg   Physical Exam: General: Frail appearing female lying in bed, no acute distress HENT: , AT, on nasal cannula Eyes: EOMI, no scleral icterus Respiratory: Bilateral fine crackles, squeaks scattered throughout.  No wheezing Cardiovascular: RRR, no MGR,  no JVD GI: Bowel sounds present, soft, nontender to palpation Extremities:-Edema,-tenderness Neuro: AAO x4 CN II to XII grossly intact Psych: Normal mood, normal affect  I personally reviewed pertinent labs: Na 132 K4.4 BUN/Cr 28/0.74  Assessment & Plan:  85 year old female who initially presented with fall and found with right hip fracture.  PCCM consulted for hypoxemia.  Acute on chronic hypoxemic respiratory failure  Concern for interstitial lung disease Suspected amiodarone lung toxicity CT chest 07/26/2018 demonstrates bilateral diffuse interstitial and groundglass infiltrates. 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 Other etiologies considered include undiagnosed UIP or subacute hypersensitive pneumonitis. Resting oxygen requirements improving however  continues to require high FiO2 with exertion.  Underwent LHC demonstrates three-vessel CAD with recommendations from cardiology to medically manage. RHC suggestive of mild pulmonary hypertension.  Respiratory status unchanged, still requiring 10 L via nasal cannula at rest.  Will need to determine oxygen requirement with exertion.  PLAN -S/p solumedrol at 1g/day x 48h.  -Continue prednisone 1 mg/kg (60mg ) daily. Plan to slow taper over the next 2 to 4 months  -Continue to wean supplemental oxygen for goal saturations 88 to 92%      >>wean O2 as tolerated during rest.        >>titrate up O2 requirements prior to activity.   -Patient will need ambulatory O2 prior to discharge -Diuresis as tolerated with goal net even daily       >>Lasix 20 mg IV ordered -Amiodarone has been discontinued.  Recommend against restarting. -No role for Ofev/esbriet (patient has this ordered through Dr Alcide Clever) for now -We will arrange with follow-up in ILD clinic with Dr. Chase Caller to be considered for ILD PRO study as an outpatient  Best Practice:  Diet: Regular Pain/Anxiety/Delirium protocol (if indicated): N/A. VAP protocol (if  indicated): N/A. DVT prophylaxis: SCD's / apixaban. GI prophylaxis: Pantoprazole. Glucose control: N/A. Mobility: PT Code Status: DNR. Family Communication: No family at bedside  Care Time devoted to patient care services described in this note is  30 Minutes. This time reflects time of care of this signee Dr. Rodman Pickle. This critical care time does not reflect procedure time, or teaching time or supervisory time of PA/NP/Med student/Med Resident etc but could involve care discussion time.  Rodman Pickle, M.D. [facility] Pulmonary/Critical Care Medicine. Pager: [phone]. After hours pager: [phone].

## 2018-08-03 NOTE — Progress Notes (Signed)
PROGRESS NOTE    Brooke Mullins   TMH:[id number]  DOB: Mar 06, 1934  DOA: 07/23/2018 PCP: Brooke Liter, MD   Brief Narrative:  [name] 85 year old female with history of recently diagnosed pulmonary fibrosis chronic hypoxic respiratory failure on 2 L nasal cannula just prior to admission, persistent A- fib, secondary pulm HTN, PVD came to the ER after fall at home.    Admitted from 04/25/18- 04/28/18 to the cardiology service (as a transfer from [place]) for dyspnea with a productive cough, hypoxia needing a BiPAP. She was diuresed and was weaned down to about 3-4 l O2 when discharged.  She followed up later with a pulmonologist in [place], Brooke Mullins, underwent a lung biopsy and was diagnosed with IPF and started on 20 mg of Prednisone daily. She was also prescribed another medications which they are trying to get approved through her insurance.  She had noted more shortness of breath the day before the fall that brought her to the ED.   In the ER, she was found to have right pelvic fracture, was admitted for pain control & nonoperative management. H and P notes that her pulse ox was 81% the day before.  I started to see her on 12/25 and noted she was on 100% FiO2 via a Face mask. It was noted the night before that she had become more hypoxic. A dose of IV Solumedrol and one of IV Lasix were given. CXR showed interstitial pulmonary edema.  Subjective: No complaints. A bit drowsy today.    Assessment & Plan:   Principal Problem:   Acute on chronic respiratory failure with hypoxia - suspecting Amiodarone toxicity   H/o pulmonary fibrosis on Prednisone at home - last admitted in Sept with acute resp failure from CHF-  ECHO showed severe concentric LV wall thickening with speckled intraventricular septum suggestive of infiltrative cardiomyopathy such as amyloidosis - 12/25 she had b/l crackles on exam and CXR suggestive of pulm edema  -  checked chest CT- showed b/l ground glass  infiltrates- checked pro calcitonin which was negative - transfered to SDU for BiPAP on 12/25- given further Lasix    - 12/26 - not improving- I subsequently felt that this is not a CHF exacerbation- cardiology and pulmonary consults requested for second opinions- further work up revealed that she dose not have infiltrative cardiomyopathy- 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  -  Amiodarone stopped and IV steroids started by Pulm-  - Per pulm  > 'Start prednisone at 1mg /kg (max dose 80mg  per day) on 12/31 and then slow taper over 8-16 weeks to off- Continue supplemental oxygen to maintain saturations 88 to 92%-No role for Ofev/esbriet (patient has this ordered through Brooke Mullins) for now'  -pulmonary plans to make a f/u in ILD clinic with Brooke Mullins to be considered for ILD PRO study - pulmonary signed off 1/1 - her O 2 requirement has subsequently improved considerable (from BiPAP to 70% FiO2 to 10 L now) - all work up done and overall stable to be discharged to SNF- unfortunately, she is not able to d/c to SNF until her FiO 2 requirement reduces to 5 L (per case manager)   Active Problems:  Hyperglycemia due to steroids- no h/o DM - have had to start Insulin as sugars close to 300s - on Lantus/ Novolog which I have been titrating up    Persistent atrial fibrillation - cont Metoprolol- holding Amio - was Heparin infusion for cath- now back  on Apixaban    - rate is controlled  Mild AKI   - baseline Cr ~ 0.8-0.9-Cr rose slightly to ~ 1.33 with elevated BUN likely a bit dry due to lasix- - Cr subsequently improved after holding Lasix    Infiltrative cardiomyopathy? - cardiology consulted to further evaluated of infiltrative cardiomyopathy noted on prior ECHO- TTR Amyloid scan neg for amyloid- per Cardiology who has reviewed the study, she does not have an infiltrative cardiomyopathy   CAD noted on CT scan - right and left heart cath shows mild pulm HTN,  multivessel, severe calcific three vessel disease- cardiology recommending medical management - use Apixaban and no aspirin per cardiology- Crestor and Imdur started - cardiology has signed off with following recommendations> Lasix 40 mg PRN weight gain, apixaban 2.5 mg BID, metoprolol 12.5 mg BID, rosuvastatin 5 mg daily, imdur 15 mg daily     Pelvic fracture/   Fall at home  - ortho consult 12/23>  can f/u with orthopedic surgeon in [place] who performed hemiarthroplasty-  but may see Brooke. Griffin Mullins who evaluated her in the hospital - pain control- will be going to SNF in [place]    Hypothyroidism - Synthroid  Constipated - cont laxatives  Anemia  - likely due to chronic disease- keep > 8 due to severe CAD  DVT prophylaxis: Eliquis Code Status: DNR Family Communication: daughter and son Disposition Plan:    f/u with SW for SNF Consultants:   PCCM  Cardiology  ortho Procedures:  2 D ECHO  Ost RCA lesion is 90% stenosed.  Mid LM lesion is 50% stenosed.  Ost 1st Mrg lesion is 90% stenosed.  Ost 2nd Mrg lesion is 90% stenosed.  Mid LAD lesion is 80% stenosed.  LV end diastolic pressure is normal. LVEDP 9 mm Hg.  There is no aortic valve stenosis.  Hemodynamic findings consistent with mild pulmonary hypertension.  Ao sat 100%, PA sat 75%, mean PA 26 mm Hg; mean PCWP 10 mm Hg; CO 7.2 L/min; CI 4.4   Cardiac cath: Severe calcific three vessel CAD. Antimicrobials:  Anti-infectives (From admission, onward)   None       Objective: Vitals:   08/03/18 0733 08/03/18 0750 08/03/18 0922 08/03/18 1141  BP:  (!) 160/89  140/62  Pulse: 67 66 81 (!) 57  Resp: 16 (!) 24  19  Temp:  98.2 F (36.8 C)  98 F (36.7 C)  TempSrc:  Oral  Oral  SpO2: 93% 93%  99%  Weight:      Height:        Intake/Output Summary (Last 24 hours) at 08/03/2018 1307 Last data filed at 08/02/2018 1700 Gross per 24 hour  Intake 240 ml  Output -  Net 240 ml   Filed Weights   08/01/18 0500  08/02/18 0500 08/03/18 0341  Weight: 58.9 kg 58.9 kg 61.5 kg    Examination: General exam: Appears comfortable  HEENT: PERRLA, oral mucosa moist, no sclera icterus or thrush Respiratory system: continues to have b/l crackles- Respiratory effort normal-  - now on 10 L O2 Cardiovascular system: S1 & S2 heard,  No murmurs  Gastrointestinal system: Abdomen soft, non-tender, nondistended. Normal bowel sound. No organomegaly Central nervous system: Alert and oriented. No focal neurological deficits. Extremities: No cyanosis, clubbing or edema Skin: No rashes or ulcers Psychiatry:  Mood & affect appropriate. .    Data Reviewed: I have personally reviewed following labs and imaging studies  CBC: Recent Labs  Lab 07/29/18 0204 07/30/18 0329  07/31/18 0228 08/01/18 0313 08/03/18 0249  WBC 10.8* 8.2 8.3 6.2 9.4  HGB 9.1* 8.8* 8.8* 8.3* 8.4*  HCT 29.2* 28.4* 27.9* 27.3* 26.3*  MCV 75.1* 75.1* 76.0* 76.7* 76.0*  PLT 324 313 339 285 099   Basic Metabolic Panel: Recent Labs  Lab 07/29/18 0204 07/30/18 0329 07/31/18 0228 08/01/18 0313 08/03/18 0249  NA 131* 133* 133* 135 132*  K 3.9 4.1 4.4 4.4 4.4  CL 90* 93* 93* 96* 94*  CO2 26 28 28 26 30   GLUCOSE 262* 237* 225* 258* 180*  BUN 48* 46* 35* 32* 28*  CREATININE 1.33* 1.25* 0.94 0.81 0.74  CALCIUM 8.9 8.6* 8.7* 8.4* 8.2*   GFR: Estimated Creatinine Clearance: 46.8 mL/min (by C-G formula based on SCr of 0.74 mg/dL). Liver Function Tests: No results for input(s): AST, ALT, ALKPHOS, BILITOT, PROT, ALBUMIN in the last 168 hours. Recent Labs  Lab 07/27/18 1326  LIPASE 52*   No results for input(s): AMMONIA in the last 168 hours. Coagulation Profile: No results for input(s): INR, PROTIME in the last 168 hours. Cardiac Enzymes: Recent Labs  Lab 07/27/18 1326  CKTOTAL 14*  CKMB 1.1   BNP (last 3 results) Recent Labs    05/12/18 1020  PROBNP 2,040*   HbA1C: No results for input(s): HGBA1C in the last 72  hours. CBG: Recent Labs  Lab 08/02/18 1116 08/02/18 1636 08/02/18 2130 08/03/18 0750 08/03/18 1140  GLUCAP 226* 274* 262* 117* 200*   Lipid Profile: No results for input(s): CHOL, HDL, LDLCALC, TRIG, CHOLHDL, LDLDIRECT in the last 72 hours. Thyroid Function Tests: No results for input(s): TSH, T4TOTAL, FREET4, T3FREE, THYROIDAB in the last 72 hours. Anemia Panel: No results for input(s): VITAMINB12, FOLATE, FERRITIN, TIBC, IRON, RETICCTPCT in the last 72 hours. Urine analysis:    Component Value Date/Time   COLORURINE YELLOW (A) 07/23/2018 1516   APPEARANCEUR CLEAR (A) 07/23/2018 1516   LABSPEC 1.020 07/23/2018 1516   PHURINE 7.0 07/23/2018 1516   GLUCOSEU NEGATIVE 07/23/2018 1516   HGBUR NEGATIVE 07/23/2018 1516   BILIRUBINUR NEGATIVE 07/23/2018 1516   KETONESUR NEGATIVE 07/23/2018 1516   PROTEINUR NEGATIVE 07/23/2018 1516   NITRITE NEGATIVE 07/23/2018 1516   LEUKOCYTESUR POSITIVE (A) 07/23/2018 1516   Sepsis Labs: @LABRCNTIP (procalcitonin:4,lacticidven:4) ) Recent Results (from the past 240 hour(s))  Respiratory Panel by PCR     Status: None   Collection Time: 07/27/18 12:58 PM  Result Value Ref Range Status   Adenovirus NOT DETECTED NOT DETECTED Final   Coronavirus 229E NOT DETECTED NOT DETECTED Final   Coronavirus HKU1 NOT DETECTED NOT DETECTED Final   Coronavirus NL63 NOT DETECTED NOT DETECTED Final   Coronavirus OC43 NOT DETECTED NOT DETECTED Final   Metapneumovirus NOT DETECTED NOT DETECTED Final   Rhinovirus / Enterovirus NOT DETECTED NOT DETECTED Final   Influenza A NOT DETECTED NOT DETECTED Final   Influenza B NOT DETECTED NOT DETECTED Final   Parainfluenza Virus 1 NOT DETECTED NOT DETECTED Final   Parainfluenza Virus 2 NOT DETECTED NOT DETECTED Final   Parainfluenza Virus 3 NOT DETECTED NOT DETECTED Final   Parainfluenza Virus 4 NOT DETECTED NOT DETECTED Final   Respiratory Syncytial Virus NOT DETECTED NOT DETECTED Final   Bordetella pertussis NOT  DETECTED NOT DETECTED Final   Chlamydophila pneumoniae NOT DETECTED NOT DETECTED Final   Mycoplasma pneumoniae NOT DETECTED NOT DETECTED Final    Comment: Performed at [facility], [place] [address], [place], [place] [address]  Radiology Studies: No results found.    Scheduled Meds: . apixaban  2.5 mg Oral BID  . escitalopram  20 mg Oral QHS  . feeding supplement (ENSURE ENLIVE)  237 mL Oral BID BM  . insulin aspart  0-15 Units Subcutaneous TID WC  . insulin aspart  0-5 Units Subcutaneous QHS  . insulin glargine  15 Units Subcutaneous QHS  . iron polysaccharides  150 mg Oral Daily  . isosorbide mononitrate  15 mg Oral Daily  . levothyroxine  75 mcg Oral QAC breakfast  . mouth rinse  15 mL Mouth Rinse BID  . metoprolol tartrate  12.5 mg Oral BID  . multivitamin with minerals  1 tablet Oral Daily  . pantoprazole  40 mg Oral Daily  . polyethylene glycol  17 g Oral Daily  . predniSONE  60 mg Oral Q breakfast  . rosuvastatin  5 mg Oral q1800  . sodium chloride flush  3 mL Intravenous Q12H   Continuous Infusions: . sodium chloride 10 mL/hr at 08/01/18 0000     LOS: 11 days    Time spent in minutes: 35    Debbe Odea, MD [facility] Pager: [email or web address] Password [email or web address] 08/03/2018, 1:07 PM

## 2018-08-03 NOTE — Clinical Social Work Note (Signed)
Patient can be on a maximum of 5 L nasal canula before going to SNF.  [place] Scrape, [place]

## 2018-08-03 NOTE — Progress Notes (Signed)
**Note Brooke-Identified via Obfuscation** Progress Note  Patient Name: Brooke Mullins Date of Encounter: 08/03/2018  Primary Cardiologist: [name], MD  Subjective   Sitting up in bed this AM. No chest pain. Discussed plans for medical management.  Inpatient Medications    Scheduled Meds: . apixaban  2.5 mg Oral BID  . escitalopram  20 mg Oral QHS  . feeding supplement (ENSURE ENLIVE)  237 mL Oral BID BM  . insulin aspart  0-15 Units Subcutaneous TID WC  . insulin aspart  0-5 Units Subcutaneous QHS  . insulin glargine  15 Units Subcutaneous QHS  . iron polysaccharides  150 mg Oral Daily  . isosorbide mononitrate  15 mg Oral Daily  . levothyroxine  75 mcg Oral QAC breakfast  . mouth rinse  15 mL Mouth Rinse BID  . metoprolol tartrate  12.5 mg Oral BID  . multivitamin with minerals  1 tablet Oral Daily  . pantoprazole  40 mg Oral Daily  . polyethylene glycol  17 g Oral Daily  . predniSONE  60 mg Oral Q breakfast  . rosuvastatin  5 mg Oral q1800  . sodium chloride flush  3 mL Intravenous Q12H   Continuous Infusions: . sodium chloride 10 mL/hr at 08/01/18 0000   PRN Meds: sodium chloride, acetaminophen **OR** acetaminophen, acetaminophen, albuterol, HYDROcodone-acetaminophen, morphine injection, ondansetron (ZOFRAN) IV, polyethylene glycol, polyvinyl alcohol, sodium chloride flush   Vital Signs    Vitals:   08/02/18 2300 08/03/18 0341 08/03/18 0733 08/03/18 0750  BP: (!) 163/55 128/76  (!) 160/89  Pulse: 100 (!) 58 67 66  Resp: _0 (!) 24  Temp: 98.1 F (36.7 C) 98 F (36.7 C)  98.2 F (36.8 C)  TempSrc: Oral Oral  Oral  SpO2: 100% 96% 93% 93%  Weight:  61.5 kg    Height:        Intake/Output Summary (Last 24 hours) at 08/03/2018 0837 Last data filed at 08/02/2018 1700 Gross per 24 hour  Intake 240 ml  Output -  Net 240 ml   Filed Weights   08/01/18 0500 08/02/18 0500 08/03/18 0341  Weight: 58.9 kg 58.9 kg 61.5 kg    Telemetry    NSR with PAC's - Personally Reviewed  ECG    No new  tracing since 12/22  Physical Exam   GEN: No acute distress.  Sitting in bed, [place] in place Neck: No JVD Cardiac: regular S1 and S2 with occasional early beats Respiratory: diffusely coarse GI: Soft, nontender, non-distended  MS: No edema; No deformity. Neuro:  Nonfocal  Psych: Normal affect   Labs    Chemistry Recent Labs  Lab 07/31/18 0228 08/01/18 0313 08/03/18 0249  NA 133* 135 132*  K 4.4 4.4 4.4  CL 93* 96* 94*  CO2 _1 GLUCOSE 225* 258* 180*  BUN 35* 32* 28*  CREATININE 0.94 0.81 0.74  CALCIUM 8.7* 8.4* 8.2*  GFRNONAA 56* >60 >60  GFRAA >60 >60 >60  ANIONGAP _2 Hematology Recent Labs  Lab 07/31/18 0228 08/01/18 0313 08/03/18 0249  WBC 8.3 6.2 9.4  RBC 3.67* 3.56* 3.46*  HGB 8.8* 8.3* 8.4*  HCT 27.9* 27.3* 26.3*  MCV 76.0* 76.7* 76.0*  MCH 24.0* 23.3* 24.3*  MCHC 31.5 30.4 31.9  RDW 21.6* 21.6* 22.0*  PLT 339 285 310    Cardiac EnzymesNo results for input(s): TROPONINI in the last 168 hours. No results for input(s): TROPIPOC in the last 168 hours.   BNP No  results for input(s): BNP, PROBNP in the last 168 hours.   DDimer No results for input(s): DDIMER in the last 168 hours.   Radiology    No results found.  Cardiac Studies   Echo 04/26/18  - Left ventricle: The cavity size was normal. There was severe concentric hypertrophy. Systolic function was vigorous. The estimated ejection fraction was in the range of 65% to 70%. Wall motion was normal; there were no regional wall motion abnormalities. The study is not technically sufficient to allow evaluation of LV diastolic function. - Aortic valve: Sclerosis without stenosis. There was trivial regurgitation. - Mitral valve: Calcified annulus. Mildly thickened leaflets . There was trivial regurgitation. - Left atrium: Moderately dilated. - Right ventricle: The cavity size was mildly dilated. Mildly reduced systolic function. - Tricuspid valve: There was  moderate regurgitation. - Pulmonary arteries: PA peak pressure: 59 mm Hg (S). - Inferior vena cava: The vessel was normal in size. The respirophasic diameter changes were in the normal range (>= 50%), consistent with normal central venous pressure.  Impressions:  - LVEF 65-70%, severe concentric LV wall thickening - speckled intraventricular septum suggestive of infiltrative cardiomyopathy such as amyloidosis. Consider PYP nuclear scan. Grossly normal wall motion, aortic sclerosis with trivial AI, MAC with trivial MR, moderate LAE, mildly dilated RV with reduced systolic function, moderate TR, RVSP 59 mmHg, normal IVC.  Cath 07/31/18  Ost RCA lesion is 90% stenosed.  Mid LM lesion is 50% stenosed.  Ost 1st Mrg lesion is 90% stenosed.  Ost 2nd Mrg lesion is 90% stenosed.  Mid LAD lesion is 80% stenosed.  LV end diastolic pressure is normal. LVEDP 9 mm Hg.  There is no aortic valve stenosis.  Hemodynamic findings consistent with mild pulmonary hypertension.  Ao sat 100%, PA sat 75%, mean PA 26 mm Hg; mean PCWP 10 mm Hg; CO 7.2 L/min; CI 4.4  Severe calcific three vessel CAD.  Patient Profile     85 y.o. 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  Assessment & Plan    Shortness of breath and acute on chronic respiratory failure with hypoxia: suspect predominantly ILD/possible amio lung toxicity. Cath numbers don't suggest diastolic heart failure as primary cause, and right sided pressures lower than I'd expect given her degree of lung disease. -Amyloid scan equivocal. -euvolemic today. Would do PRN weight based lasix dosing at discharge. -steroids per pulm. No amiodarone in the future  Coronary artery disease: chest  pain free. Severe 3-vessel CAD noted on catheterization.  She is in agreement for medical therapy. -defer aspirin given need for apixaban for afib -continue metoprolol, rosuvastatin, isosorbide mononitrate. Can uptitrate isosorbide as blood pressure allows -has not tolerated atorvastatin or higher dose rosuvastatin in the past. Monitor for side effects on low dose rosuvastatin, uptitrate as able.  Atrial fibrillation, paroxysmal: predominantly NSR with PACs on telemetry -continue apixaban, but monitor CBC closely given anemia  Hypertension: has been labile. Elevated this AM, has not yet received medications.  -continue isosorbide, uptitrate as able -Add isosorbide mononitrate 15 mg daily. Can uptitrate if BP remains elevated -has had well controlled blood pressures at prior office visits, but may need an additional agent if still elevated on outpatient follow up -Continue metoprolol tartrate 12.5 mg BID.  Anemia: Hemoglobin stable between 8-9, no overt bleeding Hemoglobin 8-9 (most recent check yesterday). -low  threshold for stopping apixaban if hemoglobin drops further.  [facility] will sign off in anticipation of discharge.   Medication Recommendations:  Lasix 40 mg PRN weight gain, apixaban 2.5 mg BID, metoprolol 12.5 mg BID, rosuvastatin 5 mg daily, imdur 15 mg daily Other recommendations (labs, testing, etc):  None for cards Follow up as an outpatient:  We will arrange post discharge follow up with Dr. Wendy Poet office.  For questions or updates, please contact [place] Please consult [email or web address] for contact info under   Signed, Buford Dresser, MD  08/03/2018, 8:37 AM

## 2018-08-03 NOTE — Progress Notes (Signed)
Nutrition Follow-up  DOCUMENTATION CODES:   Not applicable  INTERVENTION:   -Continue Ensure Enlive po BID, each supplement provides 350 kcal and 20 grams of protein -30 ml Prostat daily, each supplement provides 100 kcals and 15 grams protein -Continue MVI with minerals daily  NUTRITION DIAGNOSIS:   Increased nutrient needs related to chronic illness(COPD) as evidenced by estimated needs.  Ongoing  GOAL:   Patient will meet greater than or equal to 90% of their needs  Progressing  MONITOR:   PO intake, Supplement acceptance, Labs, Weight trends, Skin, I & O's  REASON FOR ASSESSMENT:   Consult Assessment of nutrition requirement/status  ASSESSMENT:   84y/o with history of COPD and chronic respiratory failure on 3 L nasal cannula at home.  She presents with complaints of falling after breakfast. She stood up from table and went down. It was a witnessed fall by husband. She did not hit her head, there is no report of LOC. The patient does not remember any of the event. She uses a cane, walker and wheelchair but uses the cane the most. She does not commonly fall.   Reviewed I/O's: +238 ml output x 24 hours and +322 ml output since admission  Pt receiving nursing care at time of visit. Unable to speak with pt at this time.   Pt with variable intake; PO: 25-100%. Pt is generally accepting of Ensure supplements per MAR.   Per CSW notes, plan to d/c to SNF once Fi)2 requirement down to 5 L.   Medications reviewed and include prednisone, which is likely to contributing to high CBGS.   Pt with variable oral intake and would benefit from nutrient dense supplement. One Ensure Enlive supplement provides 350 kcals, 20 grams protein, and 44-45 grams of carbohydrate vs one Glucerna shake supplement, which provides 220 kcals, 10 grams of protein, and 26 grams of carbohydrate. Given pt's hx of DM, RD will continue to monitor PO intake, CBGS, and adjust supplement regimen as appropriate.    Labs reviewed: Na: 132, CBGS: 117-262 (inpatient orders for glycemic control are 0-15 units insulin aspart TID with meals, 0-5 units insulin aspart q HS, and 15 units insulin glargine q HS).   Diet Order:   Diet Order            Diet Heart Room service appropriate? Yes; Fluid consistency: Thin  Diet effective now              EDUCATION NEEDS:   Education needs have been addressed  Skin:  Skin Assessment: Skin Integrity Issues: Skin Integrity Issues:: Stage I, DTI DTI: lt medial buttocks Stage I: rt foot, sacrum (epithelialized)  Last BM:  08/02/18  Height:   Ht Readings from Last 1 Encounters:  07/23/18 5\' 4"  (1.626 m)    Weight:   Wt Readings from Last 1 Encounters:  08/03/18 61.5 kg    Ideal Body Weight:  54.5 kg  BMI:  Body mass index is 23.27 kg/m.  Estimated Nutritional Needs:   Kcal:  1650-1850  Protein:  80-95 grams  Fluid:  1.6-1.8 L    Rannie Craney A. Jimmye Norman, RD, LDN, CDE Pager: [phone] After hours Pager: [phone]

## 2018-08-03 NOTE — Progress Notes (Signed)
Patient slipped down on the floor when she stood up and pivoted toward the chair to sit. This nurse was holding patient but patient sit down slowly because her Lt. Leg given up. Patient's niece was in the room and saw the event. Patient denied any pain on Rt. Hip or Lt. Leg, only her chronic back pain. Administered pain medications. Notified Dr. Wynelle Cleveland regarding this event and no further treatment, just monitoring pt's condition. HS [employer]

## 2018-08-04 DIAGNOSIS — S32591A Other specified fracture of right pubis, initial encounter for closed fracture: Secondary | ICD-10-CM | POA: Diagnosis not present

## 2018-08-04 DIAGNOSIS — J9610 Chronic respiratory failure, unspecified whether with hypoxia or hypercapnia: Secondary | ICD-10-CM | POA: Diagnosis not present

## 2018-08-04 DIAGNOSIS — T462X1A Poisoning by other antidysrhythmic drugs, accidental (unintentional), initial encounter: Secondary | ICD-10-CM

## 2018-08-04 DIAGNOSIS — S31829A Unspecified open wound of left buttock, initial encounter: Secondary | ICD-10-CM | POA: Diagnosis not present

## 2018-08-04 DIAGNOSIS — W19XXXD Unspecified fall, subsequent encounter: Secondary | ICD-10-CM | POA: Diagnosis not present

## 2018-08-04 DIAGNOSIS — J849 Interstitial pulmonary disease, unspecified: Secondary | ICD-10-CM | POA: Diagnosis not present

## 2018-08-04 DIAGNOSIS — E119 Type 2 diabetes mellitus without complications: Secondary | ICD-10-CM | POA: Diagnosis not present

## 2018-08-04 DIAGNOSIS — J811 Chronic pulmonary edema: Secondary | ICD-10-CM | POA: Diagnosis not present

## 2018-08-04 DIAGNOSIS — J45909 Unspecified asthma, uncomplicated: Secondary | ICD-10-CM | POA: Diagnosis not present

## 2018-08-04 DIAGNOSIS — E785 Hyperlipidemia, unspecified: Secondary | ICD-10-CM | POA: Diagnosis not present

## 2018-08-04 DIAGNOSIS — S31819A Unspecified open wound of right buttock, initial encounter: Secondary | ICD-10-CM | POA: Diagnosis not present

## 2018-08-04 DIAGNOSIS — I4819 Other persistent atrial fibrillation: Secondary | ICD-10-CM | POA: Diagnosis not present

## 2018-08-04 DIAGNOSIS — J188 Other pneumonia, unspecified organism: Secondary | ICD-10-CM | POA: Diagnosis not present

## 2018-08-04 DIAGNOSIS — J449 Chronic obstructive pulmonary disease, unspecified: Secondary | ICD-10-CM | POA: Diagnosis not present

## 2018-08-04 DIAGNOSIS — R05 Cough: Secondary | ICD-10-CM | POA: Diagnosis not present

## 2018-08-04 DIAGNOSIS — R0602 Shortness of breath: Secondary | ICD-10-CM | POA: Diagnosis not present

## 2018-08-04 DIAGNOSIS — S3289XD Fracture of other parts of pelvis, subsequent encounter for fracture with routine healing: Secondary | ICD-10-CM | POA: Diagnosis not present

## 2018-08-04 DIAGNOSIS — I509 Heart failure, unspecified: Secondary | ICD-10-CM | POA: Diagnosis not present

## 2018-08-04 DIAGNOSIS — R262 Difficulty in walking, not elsewhere classified: Secondary | ICD-10-CM | POA: Diagnosis not present

## 2018-08-04 DIAGNOSIS — D68318 Other hemorrhagic disorder due to intrinsic circulating anticoagulants, antibodies, or inhibitors: Secondary | ICD-10-CM | POA: Diagnosis not present

## 2018-08-04 DIAGNOSIS — I10 Essential (primary) hypertension: Secondary | ICD-10-CM | POA: Diagnosis not present

## 2018-08-04 DIAGNOSIS — S329XXA Fracture of unspecified parts of lumbosacral spine and pelvis, initial encounter for closed fracture: Secondary | ICD-10-CM | POA: Diagnosis present

## 2018-08-04 DIAGNOSIS — R0902 Hypoxemia: Secondary | ICD-10-CM | POA: Diagnosis not present

## 2018-08-04 DIAGNOSIS — Z7401 Bed confinement status: Secondary | ICD-10-CM | POA: Diagnosis not present

## 2018-08-04 DIAGNOSIS — M2550 Pain in unspecified joint: Secondary | ICD-10-CM | POA: Diagnosis not present

## 2018-08-04 DIAGNOSIS — W19XXXA Unspecified fall, initial encounter: Secondary | ICD-10-CM | POA: Diagnosis not present

## 2018-08-04 DIAGNOSIS — J9621 Acute and chronic respiratory failure with hypoxia: Secondary | ICD-10-CM | POA: Diagnosis not present

## 2018-08-04 DIAGNOSIS — E039 Hypothyroidism, unspecified: Secondary | ICD-10-CM | POA: Diagnosis not present

## 2018-08-04 DIAGNOSIS — J9601 Acute respiratory failure with hypoxia: Secondary | ICD-10-CM | POA: Diagnosis not present

## 2018-08-04 LAB — GLUCOSE, CAPILLARY
Glucose-Capillary: 132 mg/dL — ABNORMAL HIGH (ref 70–99)
Glucose-Capillary: 198 mg/dL — ABNORMAL HIGH (ref 70–99)

## 2018-08-04 MED ORDER — INSULIN GLARGINE 100 UNIT/ML ~~LOC~~ SOLN: 15.0000 [IU] | Freq: Every day | SUBCUTANEOUS | 11 refills | Status: AC

## 2018-08-04 MED ORDER — INSULIN ASPART 100 UNIT/ML ~~LOC~~ SOLN: 0.0000 [IU] | Freq: Every day | SUBCUTANEOUS | 11 refills | Status: AC

## 2018-08-04 MED ORDER — ROSUVASTATIN CALCIUM 5 MG PO TABS: 5.0000 mg | ORAL_TABLET | Freq: Every day | ORAL | 0 refills | Status: AC

## 2018-08-04 MED ORDER — POLYETHYLENE GLYCOL 3350 17 G PO PACK: 17.0000 g | PACK | Freq: Every day | ORAL | 0 refills | Status: AC | PRN

## 2018-08-04 MED ORDER — PRO-STAT SUGAR FREE PO LIQD: 30.0000 mL | Freq: Every day | ORAL | 0 refills | Status: AC

## 2018-08-04 MED ORDER — ALBUTEROL SULFATE (2.5 MG/3ML) 0.083% IN NEBU: 2.5000 mg | INHALATION_SOLUTION | RESPIRATORY_TRACT | 12 refills | Status: AC | PRN

## 2018-08-04 MED ORDER — PREDNISONE 20 MG PO TABS: 60.0000 mg | ORAL_TABLET | Freq: Every day | ORAL | 0 refills | Status: AC

## 2018-08-04 MED ORDER — ENSURE ENLIVE PO LIQD: 237.0000 mL | Freq: Two times a day (BID) | ORAL | 12 refills | Status: AC

## 2018-08-04 MED ORDER — FUROSEMIDE 40 MG PO TABS: 40.0000 mg | ORAL_TABLET | Freq: Every day | ORAL | 0 refills | Status: AC

## 2018-08-04 MED ORDER — ADULT MULTIVITAMIN W/MINERALS CH: 1.0000 | ORAL_TABLET | Freq: Every day | ORAL | 0 refills | Status: AC

## 2018-08-04 MED ORDER — ISOSORBIDE MONONITRATE ER 30 MG PO TB24: 15.0000 mg | ORAL_TABLET | Freq: Every day | ORAL | 0 refills | Status: AC

## 2018-08-04 MED ORDER — INSULIN ASPART 100 UNIT/ML ~~LOC~~ SOLN: 0.0000 [IU] | Freq: Three times a day (TID) | SUBCUTANEOUS | 11 refills | Status: AC

## 2018-08-04 NOTE — Progress Notes (Signed)
Physical Therapy Treatment Patient Details Name: Brooke Mullins MRN: [id number] DOB: 1934/01/19 Today's Date: 08/04/2018    History of Present Illness Pt is 85 yo female who fell at home and sustained R acetabular and pubic ramus fx in setting of past hemiarthroplasty. PMH: GERD, HTN, a fib, DM, PVD, and osteoporosis.    PT Comments    Pt with slow progress with mobility. Anxious and fearful of falling. Continue to recommend SNF.    Follow Up Recommendations  SNF;Supervision/Assistance - 24 hour     Equipment Recommendations  None recommended by PT    Recommendations for Other Services       Precautions / Restrictions Precautions Precautions: Fall Precaution Comments: watch 02 Restrictions Weight Bearing Restrictions: Yes RLE Weight Bearing: Touchdown weight bearing Other Position/Activity Restrictions: pt with difficulty maintaining TDWB    Mobility  Bed Mobility Overal bed mobility: Needs Assistance Bed Mobility: Supine to Sit     Supine to sit: Mod assist;HOB elevated     General bed mobility comments: assist for LEs over EOB and to raise trunk, min assist for hips to EOB  Transfers Overall transfer level: Needs assistance Equipment used: Rolling walker (2 wheeled);Ambulation equipment used Transfers: Sit to/from Stand Sit to Stand: +2 physical assistance;Mod assist         General transfer comment: assist to rise with gait belt and use of bed pad under hips, stood x 1 with RW and x 1 with stedy. Pt with urinary incontinence upon initial stand, requiring return to sitting at EOB, pt with high anxiety and fear of falling so opted for transfer with stedy. Pt with incr RR and decr SpO2 with anxiety so incr O2 from 4 to 6L. Pt unable to maintain TDWB.  Ambulation/Gait             General Gait Details: unable   Stairs             Wheelchair Mobility    Modified Rankin (Stroke Patients Only)       Balance Overall balance assessment: Needs  assistance Sitting-balance support: No upper extremity supported;Feet supported Sitting balance-Leahy Scale: Fair     Standing balance support: Bilateral upper extremity supported Standing balance-Leahy Scale: Poor Standing balance comment: UE support on Stedy or walker and +2 min assist for static standing                            Cognition Arousal/Alertness: Awake/alert Behavior During Therapy: Anxious Overall Cognitive Status: Impaired/Different from baseline Area of Impairment: Memory;Following commands;Problem solving;Safety/judgement                     Memory: Decreased short-term memory Following Commands: Follows one step commands with increased time Safety/Judgement: (hyperaware of safety/fall anxiety)   Problem Solving: Slow processing;Decreased initiation;Difficulty sequencing;Requires verbal cues;Requires tactile cues        Exercises      General Comments        Pertinent Vitals/Pain Pain Assessment: Faces Faces Pain Scale: Hurts little more Pain Location: RLE Pain Descriptors / Indicators: Grimacing;Discomfort Pain Intervention(s): Monitored during session    Home Living                      Prior Function            PT Goals (current goals can now be found in the care plan section) Acute Rehab PT Goals Patient Stated Goal: Walk again  Progress towards PT goals: Not progressing toward goals - comment(anxious)    Frequency    Min 2X/week      PT Plan Current plan remains appropriate    Co-evaluation PT/OT/SLP Co-Evaluation/Treatment: Yes Reason for Co-Treatment: For patient/therapist safety PT goals addressed during session: Mobility/safety with mobility OT goals addressed during session: Strengthening/ROM;Proper use of Adaptive equipment and DME      AM-PAC PT "6 Clicks" Mobility   Outcome Measure  Help needed turning from your back to your side while in a flat bed without using bedrails?: A  Little Help needed moving from lying on your back to sitting on the side of a flat bed without using bedrails?: A Lot Help needed moving to and from a bed to a chair (including a wheelchair)?: A Lot Help needed standing up from a chair using your arms (e.g., wheelchair or bedside chair)?: A Lot Help needed to walk in hospital room?: Total Help needed climbing 3-5 steps with a railing? : Total 6 Click Score: 11    End of Session Equipment Utilized During Treatment: Gait belt;Oxygen Activity Tolerance: Patient limited by fatigue;Other (comment)(anxiety) Patient left: in chair;with call bell/phone within reach Nurse Communication: Mobility status;Need for lift equipment PT Visit Diagnosis: Unsteadiness on feet (R26.81);Pain;Difficulty in walking, not elsewhere classified (R26.2);History of falling (Z91.81);Muscle weakness (generalized) (M62.81) Pain - Right/Left: Right Pain - part of body: Leg     Time: 7858-8502 PT Time Calculation (min) (ACUTE ONLY): 28 min  Charges:  $Therapeutic Activity: 8-22 mins                     [place] Pager [phone] Office [place] 08/04/2018, 1:23 PM

## 2018-08-04 NOTE — Consult Note (Signed)
   [facility] CM Inpatient Consult   08/04/2018  SHANNEN FLANSBURG 1933/12/25 [id number]  Patient screened for high risk score for unplanned readmissions with [facility]. Patient had been previously outreached by a [facility] for Medication Adherence protocol for [facility]. Patient admitted with PMH of atrial Fib, CAD, Diabetes, and with chronic respiratory failure now with high flow oxygen.  Chart reviewed and patient is for skilled nursing facility at [facility] for short term rehab scheduled for today.  No needs assessed at this time.  For questions contact:   Natividad Brood, RN BSN [facility] Liaison  [phone] business mobile phone Toll free office [phone]

## 2018-08-04 NOTE — Clinical Social Work Note (Addendum)
Patient now on 4L nasal canula. Per MD, she is stable for discharge. Left voicemail for [facility] admissions coordinator to see if she can admit today. Patient got insurance approval on Tuesday. Will need to see if it is still active.  Dayton Scrape, [place] [phone]  10:28 am [facility] can take patient today. Sent message to MD to notify.  Dayton Scrape, [place]

## 2018-08-04 NOTE — Progress Notes (Addendum)
NAME:  Brooke Mullins, MRN:  [id number], DOB:  [birth year], LOS: 77 ADMISSION DATE:  07/23/2018, CONSULTATION DATE:  07/27/18 REFERRING MD:  Wynelle Cleveland  CHIEF COMPLAINT:  SOB   Brief History   Brooke Mullins is a 85 y.o. female who was admitted 12/22 after fall.  Found to have right hip fx's that are being managed conservatively. PCCM called 12/26 due to high FiO2 needs due to possible underlying IPF (see discussion in HPI regarding IPF diagnosis).  ILD HX   [facility] Comprehensive ILD Questionnaire - family and patient filled this out and this is available currently   Symptoms:    Started amiodarone tab in may 2019. Resp issues developed after that. Major hypoxemia episode was sept 2019. Dyspnea progressive.  Level 2 dyspnea at rest.  Level for dyspnea for activities of daily living and level 5 dyspnea for walking.  There is associated cough.  The cough also started in 2019 along with the shortness of breath.  It is moderate in intensity.  She coughs at night.  There is yellow phlegm.  The coughing is affecting the voice.   in November 2019, she saw Dr. Bernette Mullins in [place] who "used a camera to go down and take pieces from my lung".  She was told that she had IPF and was started on 20mg  prednisone daily.  Ofev has been approived but not started  Past Medical History : Heart failure with atrial fibrillation.  On amiodarone since May 2019.  No collagen vascular disease.  No seizures.  Reported diagnosis of thyroid disease since July or August 2019 again postdating amiodarone.  No stroke or seizures.  No hepatitis.  No pneumonia history.  No blood clots.  No pleurisy.   ROS: Does have fatigue but no arthralgia.  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  After the fall in June she is lost 34 pounds.  No nausea vomiting rash   FAMILY HISTORY of LUNG DISEASE: Her father and sister had COPD but she denies any pulmonary fibrosis   EXPOSURE  HISTORY: She smoked cigarettes 1 pack a day.  Do not know when she started but she quit in 1994.  There is no passive smoking exposure.  No marijuana or vapor cocaine or intravenous drug use exposure   HOME and HOBBY DETAILS [place].  Age of the home is 100 years.  There is no dampness.  There is normal level of mildew in the shower curtain on occasion.  She does use the bathroom where there is mildew.  Does not use a humidifier.  No CPAP mask at home.  Does have a nebulizer machine but no contamination.  She does have a steam iron but no contamination.  No pet birds or parakeets of feather pillow.  No mold in the [facility] duct.  No gardening or music habits.   OCCUPATIONAL HISTORY (122 questions) : Positive for previously working in a damp environment and she is worked in the [employer] before she retired.  During this time the environment was dusty   PULMONARY TOXICITY HISTORY (27 items): She has been on amiodarone since May 2019.  And currently is in November 2019 on prednisone 20 mg/day  PULMONARY TOXICITY HISTORY (27 items): She has been on amiodarone since May 2019.  And currently is in November 2019 on prednisone 20 mg/day Results for FALINE, LANGER (MRN [id number])  as of 07/30/2018 12:45  Ref. Range 07/27/2018 13:26  ANA Ab, IFA Unknown Negative  ANCA Proteinase 3 Latest Ref Range: 0.0 - 3.5 U/mL <3.5  CCP Antibodies IgG/IgA Latest Ref Range: 0 - 19 units 8  ds DNA Ab Latest Ref Range: 0 - 9 IU/mL <1  GBM Ab Latest Ref Range: 0 - 20 units 3  Myeloperoxidase Abs Latest Ref Range: 0.0 - 9.0 U/mL <9.0  RA Latex Turbid. Latest Ref Range: 0.0 - 13.9 IU/mL 46.2 (H)  SSA (Ro) (ENA) Antibody, IgG Latest Ref Range: 0.0 - 0.9 AI <0.2  SSB (La) (ENA) Antibody, IgG Latest Ref Range: 0.0 - 0.9 AI <0.2  Scleroderma (Scl-70) (ENA) Antibody, IgG Latest Ref Range: 0.0 - 0.9 AI <0.2  Results for CARLY, SABO (MRN [id number]) as of 07/30/2018  12:45  Ref. Range 07/27/2018 13:26  Sed Rate Latest Ref Range: 0 - 22 mm/hr 72 (H)   HP panel negative  Significant Hospital Events   12/22 > admit. 12/26 > PCCM consult. Steroids increased to 60mg  Q6h.  RVP - negative, Urine strep negzativeSed rate 70s and RF mildly elevated . Rest negative. HP panel pending 12/27: negative 2.4 liters. . ILD questionnaire - Amio intake +, Engineer, manufacturing systems +. Mild mildew/mold exppsure in bathroom in 83 year old home, prior smoker 12/28 - Remains on HFNC 75%,. Easy desats into the 70s with movement. Not using night bipap. Steroids increased to 1gm/day 12/30 - LHC/RHC  1/1 Off HFNC, on [place] 1/3 [place] wean to 4L  Consults:  Orthopedics. PCCM 12/26>>  Procedures:  None.  Significant Diagnostic Tests:  CT chest 12/25 > Diffuse GGO's with bronchiectasis.  New compression fx of T4 vertebral body. LHC 12/31 > 3v CAD RHC 12/31 > PA 15mmHg, PCWP 60mmHg, CO 7.2L/min  Micro Data:  RVP 12/26> negative  Antimicrobials:  none Interim history/subjective:  O2 requirment weaned to [facility] Patient reports "feeling okay I guess." She also endorses some weakness.   Objective:  Blood pressure (!) 141/75, pulse 65, temperature 97.8 F (36.6 C), temperature source Oral, resp. rate (!) 24, height 5\' 4"  (1.626 m), weight 62.4 kg, SpO2 97 %.        Intake/Output Summary (Last 24 hours) at 08/04/2018 1002 Last data filed at 08/04/2018 0834 Gross per 24 hour  Intake 530 ml  Output 500 ml  Net 30 ml   Filed Weights   08/02/18 0500 08/03/18 0341 08/04/18 0552  Weight: 58.9 kg 61.5 kg 62.4 kg   Physical Exam: General: Frail appearing older-adult female, reclined in bed in NAD HENT: normocephalic, atraumatic. [place] present, moist mucus membranes. PERRL Respiratory: 4LNC. Fine crackles bilaterally. No stridor, no wheezing  Cardiovascular: RRR no r/g/m. 2+ radial pulses. GI: Bowel sounds present, soft, nontender to palpation Extremities:Capillary refill < 3 seconds BUE BLE.  No pedal edema.  Neuro: AAOx4. Following commands.  Psych: calm, appropriate for age and situation.    Assessment & Plan:  85 year old female who initially presented with fall and found with right hip fracture.  PCCM consulted for hypoxemia.  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 Plan will be to slowly taper prednisone over next 2-4 months outpatient -Wean O2 as tolerated. Goal SpO2 88-92% -For SNF placement,  must be less than 5L -Wean O2 as able at rest, titrate O2 up before activity -Ambulatory O2 requirement assessment ordered, needs to be completed before discharge -s/p 1x 20mg  Lasix  with 578ml UOP -goal net even -Amiodarone remains discontinued in setting of suspected amiodarone toxicity. Do not recommend restarting. -Will arrange follow up in ILD clinic with Dr. Chase Caller as outpatient to be considered for ILD PRO study: February 4th at 2:00 p.m.  -No Ofev for now (patient had been receiving through Dr. Alcide Clever outpatient)   Rest per primary    Best Practice:  Diet: Regular Pain/Anxiety/Delirium protocol (if indicated): n/a VAP protocol (if indicated): n/a DVT prophylaxis: eliquis, SCDs GI prophylaxis: protonix Glucose control: SSI Mobility: therapy  Code Status: DNR. Family Communication: No family at bedside

## 2018-08-04 NOTE — Clinical Social Work Note (Signed)
CSW facilitated patient discharge including contacting patient family and facility to confirm patient discharge plans. Clinical information faxed to facility and family agreeable with plan. CSW arranged ambulance transport via [facility] to [place] at 1:30 pm. RN to call report prior to discharge ([phone] Room 708).  CSW will sign off for now as social work intervention is no longer needed. Please consult [place] again if new needs arise.  Dayton Scrape, [place]

## 2018-08-04 NOTE — Progress Notes (Signed)
Removed PIV access x 2. O2 4L via  with SpO2 92%. Gave the report to RN Lattie Haw at [place] [facility]. Patient didn't have clothes and wearing the hospital gown. [facility] took her belongings. HS [employer]

## 2018-08-04 NOTE — Progress Notes (Signed)
Occupational Therapy Treatment Patient Details Name: Brooke Mullins MRN: [id number] DOB: 1934/07/31 Today's Date: 08/04/2018    History of present illness Pt is 85 yo female who fell at home and sustained R acetabular and pubic ramus fx in setting of past hemiarthroplasty. PMH: GERD, HTN, a fib, DM, PVD, and osteoporosis.   OT comments  Pt pleased she will discharge to rehab at SNF later today. Agreeable to OOB mobility with therapies, but fearful of falling. Sp02 dropping to low 80s on 4L 02, RN increased to 6L with increase to 93% with pursed lip breathing, anxiety contributing. Pt continues to require 2 person assist for standing and use of lift equipment to transfer.   Follow Up Recommendations  SNF;Supervision/Assistance - 24 hour    Equipment Recommendations       Recommendations for Other Services      Precautions / Restrictions Precautions Precautions: Fall Precaution Comments: watch 02 Restrictions Weight Bearing Restrictions: Yes RLE Weight Bearing: Touchdown weight bearing Other Position/Activity Restrictions: pt with difficulty maintaining TDWB       Mobility Bed Mobility Overal bed mobility: Needs Assistance Bed Mobility: Supine to Sit     Supine to sit: HOB elevated;Mod assist     General bed mobility comments: assist for LEs over EOB and to raise trunk, min assist for hips to EOB  Transfers Overall transfer level: Needs assistance Equipment used: Rolling walker (2 wheeled);Ambulation equipment used Transfers: Sit to/from Stand Sit to Stand: +2 physical assistance;Mod assist         General transfer comment: assist to rise with gait belt and use of bed pad under hips, stood x 1 with RW and x 1 with stedy. Pt with urinary incontinence upon initial stand, requiring return to sitting at EOB, pt with high anxiety and fear of falling so opted for transfer with stedy    Balance Overall balance assessment: Needs assistance   Sitting balance-Leahy Scale:  Fair     Standing balance support: Bilateral upper extremity supported Standing balance-Leahy Scale: Poor Standing balance comment: B UE and external support with RW and stedy                           ADL either performed or assessed with clinical judgement   ADL Overall ADL's : Needs assistance/impaired     Grooming: Brushing hair;Sitting;Set up               Lower Body Dressing: Maximal assistance;Sitting/lateral leans Lower Body Dressing Details (indicate cue type and reason): socks     Toileting- Clothing Manipulation and Hygiene: Total assistance;+2 for physical assistance;Sit to/from stand Toileting - Clothing Manipulation Details (indicate cue type and reason): pt with urinary incontinence upon standing     Functional mobility during ADLs: Total assistance General ADL Comments: Pt with anxiety with sit to stand, opted to use stedy to transfer to chair.     Vision       Perception     Praxis      Cognition Arousal/Alertness: Awake/alert Behavior During Therapy: Anxious Overall Cognitive Status: Impaired/Different from baseline Area of Impairment: Memory;Following commands;Problem solving;Safety/judgement                     Memory: Decreased short-term memory Following Commands: Follows one step commands with increased time Safety/Judgement: (hyperaware of safety/fall anxiety)   Problem Solving: Slow processing;Decreased initiation;Difficulty sequencing;Requires verbal cues;Requires tactile cues          Exercises  Shoulder Instructions       General Comments      Pertinent Vitals/ Pain       Pain Assessment: Faces Faces Pain Scale: Hurts little more Pain Location: RLE Pain Descriptors / Indicators: Grimacing;Discomfort Pain Intervention(s): Monitored during session  Home Living                                          Prior Functioning/Environment              Frequency  Min 2X/week         Progress Toward Goals  OT Goals(current goals can now be found in the care plan section)  Progress towards OT goals: Progressing toward goals  Acute Rehab OT Goals Patient Stated Goal: Walk again OT Goal Formulation: With patient Time For Goal Achievement: 08/07/18 Potential to Achieve Goals: Good  Plan Discharge plan remains appropriate    Co-evaluation    PT/OT/SLP Co-Evaluation/Treatment: Yes Reason for Co-Treatment: For patient/therapist safety   OT goals addressed during session: Strengthening/ROM;Proper use of Adaptive equipment and DME      AM-PAC OT "6 Clicks" Daily Activity     Outcome Measure   Help from another person eating meals?: None Help from another person taking care of personal grooming?: A Little Help from another person toileting, which includes using toliet, bedpan, or urinal?: Total Help from another person bathing (including washing, rinsing, drying)?: A Lot Help from another person to put on and taking off regular upper body clothing?: A Little Help from another person to put on and taking off regular lower body clothing?: Total 6 Click Score: 14    End of Session Equipment Utilized During Treatment: Gait belt;Rolling walker;Oxygen  OT Visit Diagnosis: Unsteadiness on feet (R26.81);Other abnormalities of gait and mobility (R26.89);Muscle weakness (generalized) (M62.81);Other symptoms and signs involving cognitive function;Pain Pain - Right/Left: Right Pain - part of body: Leg   Activity Tolerance Patient limited by fatigue   Patient Left in chair;with call bell/phone within reach;with family/visitor present   Nurse Communication Mobility status;Need for lift equipment        Time: 1120-1148 OT Time Calculation (min): 28 min  Charges: OT General Charges $OT Visit: 1 Visit OT Treatments $Therapeutic Activity: 8-22 mins  Nestor Lewandowsky, OTR/L Acute Rehabilitation Services Pager: [phone] Office:  [phone]   Malka So 08/04/2018, 12:58 PM

## 2018-08-04 NOTE — Clinical Social Work Placement (Signed)
   CLINICAL SOCIAL WORK PLACEMENT  NOTE  Date:  08/04/2018  Patient Details  Name: Brooke Mullins MRN: [id number] Date of Birth: 07-Feb-1934  Clinical Social Work is seeking post-discharge placement for this patient at the [place] level of care (*CSW will initial, date and re-position this form in  chart as items are completed):      Patient/family provided with [place] Work Department's list of facilities offering this level of care within the geographic area requested by the patient (or if unable, by the patient's family).  Yes   Patient/family informed of their freedom to choose among providers that offer the needed level of care, that participate in Medicare, Medicaid or managed care program needed by the patient, have an available bed and are willing to accept the patient.      Patient/family informed of [facility]'s ownership interest in [facility] and [facility], as well as of the fact that they are under no obligation to receive care at these facilities.  PASRR submitted to EDS on       PASRR number received on 09/25/13     Existing PASRR number confirmed on 07/27/18     FL2 transmitted to all facilities in geographic area requested by pt/family on 07/27/18     FL2 transmitted to all facilities within larger geographic area on 07/27/18     Patient informed that his/her managed care company has contracts with or will negotiate with certain facilities, including the following:        Yes   Patient/family informed of bed offers received.  Patient chooses bed at [place], [facility]     Physician recommends and patient chooses bed at      Patient to be transferred to [place] on 08/04/18.  Patient to be transferred to facility by [facility]     Patient family notified on 08/04/18 of transfer.  Name of family member notified:  Tonye Pearson     PHYSICIAN Please prepare prescriptions, Please sign DNR     Additional Comment:      _______________________________________________ Candie Chroman, LCSW 08/04/2018, 11:35 AM

## 2018-08-04 NOTE — Discharge Summary (Addendum)
Physician Discharge Summary  Patient ID: TAMMA BRIGANDI MRN: [id number] DOB/AGE: Nov 18, 1933 85 y.o.  Admit date: 07/23/2018 Discharge date: 08/04/2018  Admission Diagnoses:  Principal Problem:   Acute on chronic respiratory failure with hypoxia [facility]) Active Problems:   Chronic respiratory failure (HCC)   ILD (interstitial lung disease) (HCC)   Pelvis fracture, right, closed, initial encounter ([place])   Persistent atrial fibrillation   Hypothyroidism   Type 2 diabetes mellitus with hemoglobin A1c goal of less than 7.0% (HCC)   Dyslipidemia   Hypertension   Depression   Chronic anticoagulation   Discharge Diagnoses:  Principal Problem:   Acute on chronic respiratory failure with hypoxia (HCC) Active Problems:   Chronic respiratory failure (HCC)   ILD (interstitial lung disease) (HCC)   Amiodarone toxicity   Pelvis fracture, right, closed, initial encounter ([place])   Persistent atrial fibrillation   Hypothyroidism   Type 2 diabetes mellitus with hemoglobin A1c goal of less than 7.0% (HCC)   Dyslipidemia   Hypertension   Depression   Chronic anticoagulation   Condition on discharge: fair   History of presenting illness:  Brooke Mullins [age] year old female with history of recently diagnosed pulmonary fibrosis chronic hypoxic respiratory failure on 2 L nasal cannula just prior to admission, persistent Mullins- fib, secondary pulm HTN, PVD came to the ER on 07/23/18 after Mullins fall at home.  Recent was admitted from 04/25/18- 04/28/18 to the cardiology service here for dyspnea with Mullins productive cough, hypoxia needing Mullins BiPAP. She was diuresed and was weaned down to about 3-4 l O2 when discharged.  She followed up later with Mullins pulmonologist in [place], Dr Bernette Mayers, underwent Mullins lung biopsy and was diagnosed with IPF and started on 20 mg of Prednisone daily. She was also prescribed another medications which they are trying to get approved through her insurance.  Pt had noted more shortness of breath the  day before the fall that brought her to the ED 07/23/18. ED Course: In the ER, CXR showed pulm fibrosis w/ possible IS edema as well but difficult to tell from poor inspiration changes. She was also found to have right pelvic fracture and was admitted for pain control & nonoperative management.      Home meds:  - apixaban 2.5 bid / amiodarone 200 bid / lasix 40 qd/ metoprolol 12.5 bid/ Kdur 20  - prednisone 20 qam  - omperazole 40 qd/ levothyroxine 75 ug hs  - escitalopram 20 hs  Hospital Problems/ Course:     Mullins/C respiratory failure with hypoxia: h/o pulmonary fibrosis on Prednisone at home; suspected amiodarone toxicity - last admitted in Sept with acute resp failure from CHF-  ECHO showed severe concentric LV wall thickeningwithspeckled intraventricular septum suggestive of infiltrative cardiomyopathy such as amyloidosis. She went home on home O2 for first time.  - admitted here w/ worsening CXR IS changes and worsening resp status. Cardiology consulted for ?CHF and she had Mullins neg TTR amyloid scan ruling out amyloid infiltration and they did Mullins L/R heart cath on 12/30 which ruled out volume overload w/ normal LVEDP.  - pulm consulted and they made the dx of amiodarone toxicity and amiodarone was dc'd w/ initiation of high dose IV bolus steroids (250mg  qid) which were given on 12/29- 08/01/18 then pt was transitioned to prednisone 60mg  day starting 08/02/17.  - Prednisone will be tapered off slowly over 8-16 wks by pulm in OP setting - Continue supplemental oxygen to maintain saturations 88 to 92% - No role for  Ofev/esbriet for now (patient had this ordered through Dr Alcide Clever her pulm MD in [place])  - Will arrange follow up in ILD clinic with Dr. Chase Caller as outpatient to be considered for ILD PRO study: February 4th at 2:00 pm is time of her f/u appt  - she is able d/c to SNF now as her FiO 2 requirement is down to 4 L [place]  - for dc to SNF today   Hyperglycemia due to steroids- no h/o DM, we  have had to start Insulin here as sugars wereclose to 300s after steroids were started. Hopefully insulin can be tapered off as the prednisone comes down in dose level    Persistent atrial fibrillation - cont Metoprolol - Amio dc'd due to severe interstitial lung disease - on Apixaban    - rate is controlled  Mild AKI   - baseline Cr ~ 0.8-0.9-Cr rose slightly to ~ 1.33 with elevated BUN likely Mullins bit dry due to lasix, Cr subsequently improved after holding Lasix    Infiltrative cardiomyopathy? - cardiology consulted to further evaluated of infiltrative cardiomyopathy noted on prior ECHO- TTR Amyloid scan neg for amyloid- per Cardiology who has reviewed the study, she does not have an infiltrative cardiomyopathy   CAD noted on CT scan - right and left heart cath done 12/30 showed multivessel, severe calcific three vessel disease (RCA 90%, LM 50%, OM1/ OM2 90%, mid LAD 80%) - cardiology recommended medical management given lack of symptoms and other comorbidities. LVEDP was normal (9-10).  - use Apixaban and no aspirin per cardiology - cardiology gave following recommendations> use lasix 40 mg PRN weight gain, continue apixaban 2.5 mg BID + metoprolol 12.5 mg BID, and rosuvastatin 5 mg qd and imdur 15 mg daily were started.      Pelvic fracture/   Fall at home  - ortho consult 12/23, non-operative treatment with TDWB RLE. Should f/u with orthopedic surgeon in [place] (who performed her prior hemiarthroplasty) ideally but may see Dr. Griffin Basil prn - pain control- will be going to SNF in [place]   Hypothyroidism - Synthroid  Constipated - cont laxatives  Anemia  - likely due to chronic disease- keep > 8 due to severe CAD   Depression: cont escitalopram 20 hs  DVT prophylaxis: Eliquis Code Status: DNR Family Communication: spoke w/ DTR on the phone and explained major issues covered above today Disposition: dc to SNF today in [place] pgr [phone] 08/04/2018, 8:26 AM   Consultants:   PCCM  Cardiology  ortho Procedures:  2 D ECHO  Ost RCA lesion is 90% stenosed.  Mid LM lesion is 50% stenosed.  Ost 1st Mrg lesion is 90% stenosed.  Ost 2nd Mrg lesion is 90% stenosed.  Mid LAD lesion is 80% stenosed.  LV end diastolic pressure is normal. LVEDP 9 mm Hg.  There is no aortic valve stenosis.  Hemodynamic findings consistent with mild pulmonary hypertension.  Ao sat 100%, PA sat 75%, mean PA 26 mm Hg; mean PCWP 10 mm Hg; CO 7.2 L/min; CI 4.4  Cardiac cath: Severe calcific three vessel CAD. Antimicrobials:     Anti-infectives (From admission, onward)   None     Discharge Exam: Blood pressure (!) 141/75, pulse 65, temperature 97.8 F (36.6 C), temperature source Oral, resp. rate (!) 24, height 5\' 4"  (1.626 m), weight 62.4 kg, SpO2 97 %. General exam:Appears comfortable  HEENT:PERRLA, oral mucosa moist, no sclera icterus or thrush Respiratory system:  continues to have b/l crackles velcro type about 1/2 up bilat Cardiovascular system:S1 &S2 heard, No murmurs  Gastrointestinal system:Abdomen soft, non-tender, nondistended. Normal bowel sound. No organomegaly Central nervous system:Alert and oriented. No focal neurological deficits. Extremities: No cyanosis, clubbing or edema Skin: No rashes or ulcers Psychiatry:Mood &affect appropriate. .   Disposition: Discharge disposition: 03-Skilled Nursing Facility        Allergies as of 08/04/2018      Reactions   Amiodarone Other (See Comments)   Interstitial lung disease Dec 2019   Penicillins Anaphylaxis, Other (See Comments)   Has patient had Mullins PCN reaction causing immediate rash, facial/tongue/throat swelling, SOB or lightheadedness with hypotension: Yes Has patient had Mullins PCN reaction causing severe rash involving mucus membranes or skin necrosis: No Has patient had Mullins PCN reaction that required hospitalization: No Has  patient had Mullins PCN reaction occurring within the last 10 years: No If all of the above answers are "NO", then may proceed with Cephalosporin use.   Atorvastatin Other (See Comments)   Muscle pain   Codeine Other (See Comments)   Nausea/Vomiting   Colesevelam Other (See Comments)   Unsure of exact reaction type   Ezetimibe Other (See Comments)   Pain in legs      Medication List    STOP taking these medications   amiodarone 200 MG tablet Commonly known as:  PACERONE     TAKE these medications   albuterol (2.5 MG/3ML) 0.083% nebulizer solution Commonly known as:  PROVENTIL Take 3 mLs (2.5 mg total) by nebulization every 2 (two) hours as needed for wheezing or shortness of breath.   apixaban 2.5 MG Tabs tablet Commonly known as:  ELIQUIS Take 1 tablet (2.5 mg total) by mouth 2 (two) times daily.   escitalopram 20 MG tablet Commonly known as:  LEXAPRO Take 20 mg by mouth at bedtime.   feeding supplement (ENSURE ENLIVE) Liqd Take 237 mLs by mouth 2 (two) times daily between meals.   feeding supplement (PRO-STAT SUGAR FREE 64) Liqd Take 30 mLs by mouth daily. Start taking on:  August 05, 2018   furosemide 40 MG tablet Commonly known as:  LASIX Take 1 tablet (40 mg total) by mouth daily.   insulin aspart 100 UNIT/ML injection Commonly known as:  novoLOG Inject 0-15 Units into the skin 3 (three) times daily with meals.   insulin aspart 100 UNIT/ML injection Commonly known as:  novoLOG Inject 0-5 Units into the skin at bedtime.   insulin glargine 100 UNIT/ML injection Commonly known as:  LANTUS Inject 0.15 mLs (15 Units total) into the skin at bedtime.   iron polysaccharides 150 MG capsule Commonly known as:  NIFEREX Take 1 capsule (150 mg total) by mouth daily.   isosorbide mononitrate 30 MG 24 hr tablet Commonly known as:  IMDUR Take 0.5 tablets (15 mg total) by mouth daily. Start taking on:  August 05, 2018   levothyroxine 75 MCG tablet Commonly known as:   SYNTHROID, LEVOTHROID Take 75 mcg by mouth at bedtime.   metoprolol tartrate 25 MG tablet Commonly known as:  LOPRESSOR Take 12.5 mg by mouth 2 (two) times daily.   multivitamin with minerals Tabs tablet Take 1 tablet by mouth daily. Start taking on:  August 05, 2018   omeprazole 20 MG capsule Commonly known as:  PRILOSEC Take 20 mg by mouth at bedtime.   polyethylene glycol packet Commonly known as:  MIRALAX / GLYCOLAX Take 17 g by mouth daily as needed for mild constipation.  potassium chloride SA 20 MEQ tablet Commonly known as:  K-DUR,KLOR-CON Take 20 mEq by mouth daily.   predniSONE 20 MG tablet Commonly known as:  DELTASONE Take 3 tablets (60 mg total) by mouth daily with breakfast. Start taking on:  August 05, 2018 What changed:  how much to take   rosuvastatin 5 MG tablet Commonly known as:  CRESTOR Take 1 tablet (5 mg total) by mouth daily at 6 PM.   SYSTANE BALANCE 0.6 % Soln Generic drug:  Propylene Glycol Place 1 drop into both eyes as needed (dry eyes).       Contact information for follow-up providers    Park Liter, MD Follow up on 08/16/2018.   Specialty:  Cardiology Why:  3:20 pm at [facility].  Contact information: [place] [id number] [phone]            Contact information for after-discharge care    Destination    [facility] .   Service:  Skilled Nursing Contact information: [place] [phone]                  Signed: Sol Blazing 08/04/2018, 11:19 AM

## 2018-08-09 DIAGNOSIS — R262 Difficulty in walking, not elsewhere classified: Secondary | ICD-10-CM | POA: Diagnosis not present

## 2018-08-15 DIAGNOSIS — S31829A Unspecified open wound of left buttock, initial encounter: Secondary | ICD-10-CM | POA: Diagnosis not present

## 2018-08-15 DIAGNOSIS — S31819A Unspecified open wound of right buttock, initial encounter: Secondary | ICD-10-CM | POA: Diagnosis not present

## 2018-08-16 ENCOUNTER — Ambulatory Visit: Payer: Medicare Other | Admitting: Cardiology

## 2018-08-17 ENCOUNTER — Ambulatory Visit: Payer: Medicare Other | Admitting: Cardiology

## 2018-08-31 ENCOUNTER — Telehealth: Payer: Self-pay | Admitting: Internal Medicine

## 2018-08-31 NOTE — Telephone Encounter (Signed)
Called and spoke with Patients Daughter, Gwinda Passe, [phone].  Her Mother passed away on Sunday, 06-Sep-2018.  Condolences were made.  She is requesting Dr Chase Caller, contact her about results from hospital testing and procedures.   Will route to Dr Chase Caller

## 2018-08-31 NOTE — Telephone Encounter (Signed)
Explained to daugher was either amio lung toxicity or prior undiagnosed IPF flared up and died of IPF flare. IF indeed patient had IPF then 20% familial risk but no clear screening strategies but if they are smoker or have resp issues should get checked out.   Daughter understanding

## 2018-09-02 DEATH — deceased

## 2018-09-05 ENCOUNTER — Institutional Professional Consult (permissible substitution): Payer: Medicare Other | Admitting: Internal Medicine

## 2018-09-05 ENCOUNTER — Institutional Professional Consult (permissible substitution): Payer: Medicare Other | Admitting: Pulmonary Disease

## 2018-09-21 ENCOUNTER — Institutional Professional Consult (permissible substitution): Payer: Medicare Other | Admitting: Internal Medicine

## 2018-10-01 DEATH — deceased

## 2020-11-12 IMAGING — DX DG CHEST 1V
1 series · 1 of 1 positions shown · non-contrast
Comparison: 06/16/2018

CLINICAL DATA: Fall, pelvic fracture

EXAM:
CHEST  1 VIEW

[t chest supine]
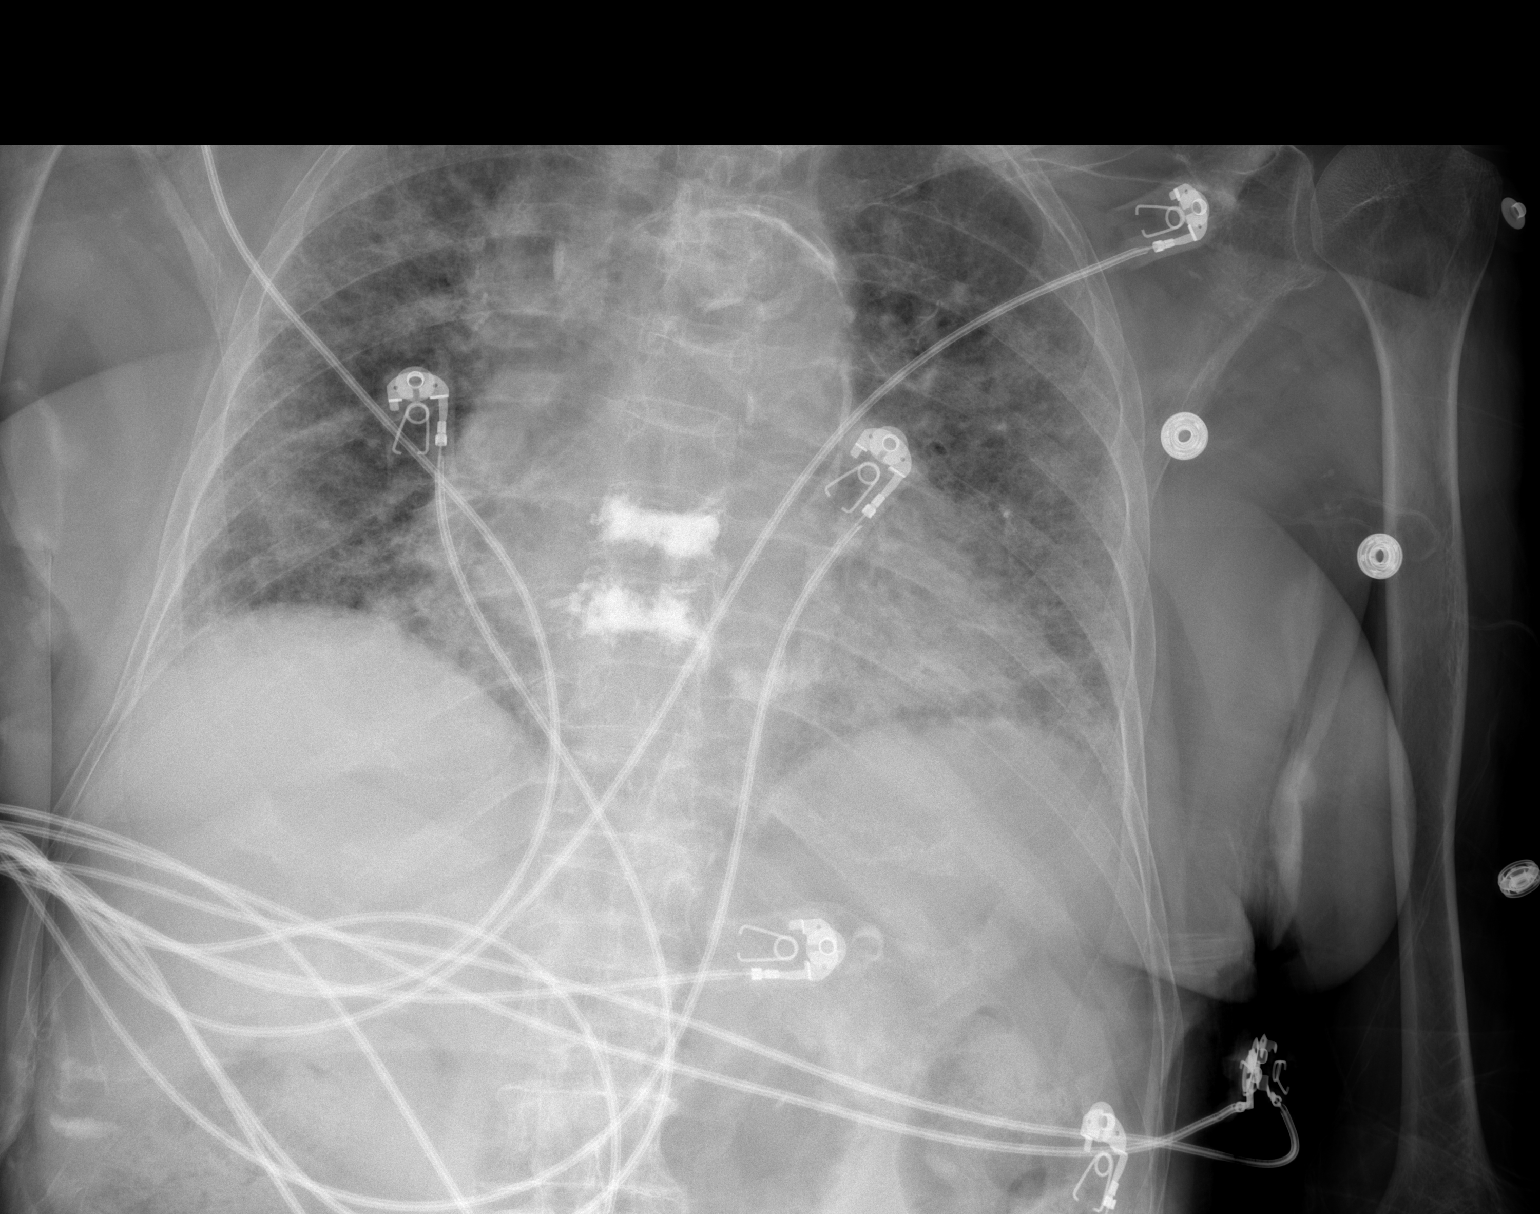

[1 of 1 positions shown; findings below may reference images not displayed]

FINDINGS: Lower lung volumes which accentuates the diffuse reticulonodular
interstitial opacities throughout both lungs compared to the prior
study. Difficult to exclude superimposed edema. Heart is enlarged.
No large effusion or pneumothorax. Aorta atherosclerotic. Previous
thoracic vertebral augmentations. Degenerative changes of the spine.
Bones are osteopenic. Monitor leads overlie the chest.
IMPRESSION: Lower lung volumes which appear to accentuate the interstitial lung
disease. Difficult to exclude superimposed edema

Heart remains enlarged.

Thoracic atherosclerosis

## 2020-11-14 IMAGING — DX DG CHEST 1V PORT
1 series · 1 of 1 positions shown · non-contrast
Comparison: 07/23/2018

CLINICAL DATA: Shortness of breath. Acute right acetabular
fracture.

EXAM:
PORTABLE CHEST 1 VIEW

[chest]
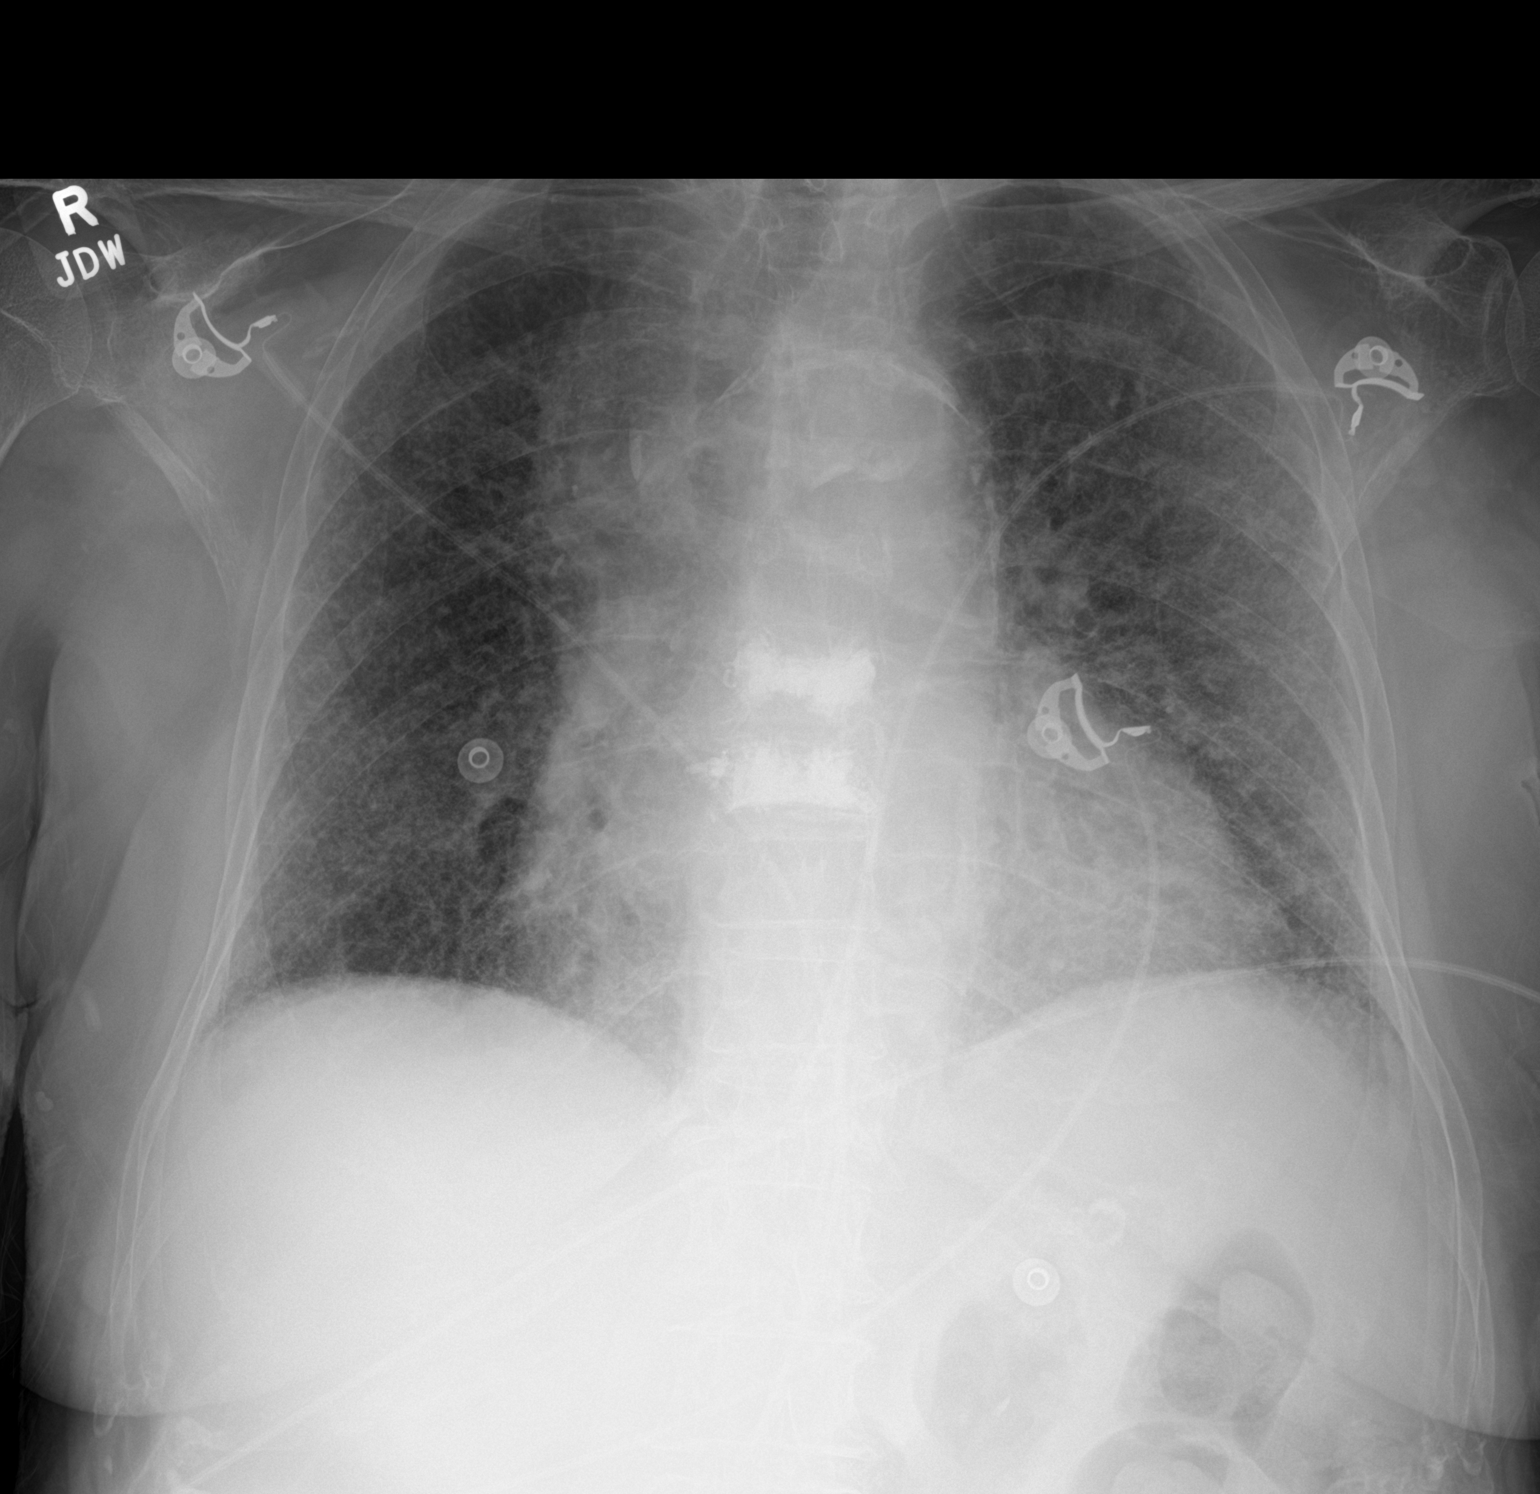

[1 of 1 positions shown; findings below may reference images not displayed]

FINDINGS: Moderate enlargement of the cardiopericardial silhouette with
bilateral diffuse interstitial accentuation. Faint Kerley B lines.
Atherosclerotic calcification of the aortic arch. The airspace
opacities on the prior exam have mildly improved. Tortuous thoracic
aorta. Two midthoracic vertebral levels demonstrate vertebral
augmentation. Bony demineralization.
IMPRESSION: 1. Interstitial pulmonary edema is present, although improved
compared to 07/23/2018.
2. Moderate cardiomegaly.
3.  Aortic Atherosclerosis ([id number]-UF1.1).
